# Patient Record
Sex: Female | Born: 1955 | Hispanic: No | Marital: Single | Smoking: Never smoker
Health system: Southern US, Community
[De-identification: ages and names within clinical notes are randomized; demographics above are authoritative.]

## PROBLEM LIST (undated history)

## (undated) DIAGNOSIS — R410 Disorientation, unspecified: Secondary | ICD-10-CM

## (undated) DIAGNOSIS — F05 Delirium due to known physiological condition: Secondary | ICD-10-CM

## (undated) DIAGNOSIS — Z1329 Encounter for screening for other suspected endocrine disorder: Secondary | ICD-10-CM

## (undated) DIAGNOSIS — I6010 Nontraumatic subarachnoid hemorrhage from unspecified middle cerebral artery: Secondary | ICD-10-CM

## (undated) DIAGNOSIS — I6930 Unspecified sequelae of cerebral infarction: Secondary | ICD-10-CM

## (undated) DIAGNOSIS — I677 Cerebral arteritis, not elsewhere classified: Secondary | ICD-10-CM

## (undated) DIAGNOSIS — A0472 Enterocolitis due to Clostridium difficile, not specified as recurrent: Secondary | ICD-10-CM

## (undated) DIAGNOSIS — M13862 Other specified arthritis, left knee: Secondary | ICD-10-CM

## (undated) DIAGNOSIS — F29 Unspecified psychosis not due to a substance or known physiological condition: Secondary | ICD-10-CM

## (undated) DIAGNOSIS — R2681 Unsteadiness on feet: Secondary | ICD-10-CM

## (undated) DIAGNOSIS — I10 Essential (primary) hypertension: Secondary | ICD-10-CM

## (undated) DIAGNOSIS — R569 Unspecified convulsions: Secondary | ICD-10-CM

## (undated) DIAGNOSIS — R488 Other symbolic dysfunctions: Secondary | ICD-10-CM

## (undated) DIAGNOSIS — E785 Hyperlipidemia, unspecified: Secondary | ICD-10-CM

## (undated) DIAGNOSIS — G8114 Spastic hemiplegia affecting left nondominant side: Secondary | ICD-10-CM

## (undated) DIAGNOSIS — D649 Anemia, unspecified: Secondary | ICD-10-CM

## (undated) DIAGNOSIS — F0151 Vascular dementia with behavioral disturbance: Secondary | ICD-10-CM

## (undated) DIAGNOSIS — M170 Bilateral primary osteoarthritis of knee: Secondary | ICD-10-CM

## (undated) DIAGNOSIS — F4320 Adjustment disorder, unspecified: Secondary | ICD-10-CM

## (undated) DIAGNOSIS — R293 Abnormal posture: Secondary | ICD-10-CM

## (undated) DIAGNOSIS — M24572 Contracture, left ankle: Secondary | ICD-10-CM

## (undated) DIAGNOSIS — I69098 Other sequelae following nontraumatic subarachnoid hemorrhage: Secondary | ICD-10-CM

## (undated) DIAGNOSIS — F0150 Vascular dementia without behavioral disturbance: Secondary | ICD-10-CM

## (undated) DIAGNOSIS — R1312 Dysphagia, oropharyngeal phase: Secondary | ICD-10-CM

## (undated) DIAGNOSIS — M6281 Muscle weakness (generalized): Secondary | ICD-10-CM

## (undated) HISTORY — PX: CEREBRAL ANEURYSM REPAIR: SHX164

---

## 2017-09-13 ENCOUNTER — Emergency Department (HOSPITAL_COMMUNITY): Payer: Medicaid Other

## 2017-09-13 ENCOUNTER — Inpatient Hospital Stay (HOSPITAL_COMMUNITY)
Admission: EM | Admit: 2017-09-13 | Discharge: 2017-09-18 | DRG: 690 | Disposition: A | Discharge status: Skilled Nursing Facility | Payer: Medicaid Other | Attending: Internal Medicine | Admitting: Internal Medicine

## 2017-09-13 ENCOUNTER — Encounter (HOSPITAL_COMMUNITY): Payer: Self-pay | Admitting: Emergency Medicine

## 2017-09-13 DIAGNOSIS — E876 Hypokalemia: Secondary | ICD-10-CM | POA: Diagnosis present

## 2017-09-13 DIAGNOSIS — E785 Hyperlipidemia, unspecified: Secondary | ICD-10-CM | POA: Diagnosis present

## 2017-09-13 DIAGNOSIS — B9620 Unspecified Escherichia coli [E. coli] as the cause of diseases classified elsewhere: Secondary | ICD-10-CM | POA: Diagnosis present

## 2017-09-13 DIAGNOSIS — F0150 Vascular dementia without behavioral disturbance: Secondary | ICD-10-CM | POA: Diagnosis present

## 2017-09-13 DIAGNOSIS — Z7982 Long term (current) use of aspirin: Secondary | ICD-10-CM

## 2017-09-13 DIAGNOSIS — N10 Acute tubulo-interstitial nephritis: Secondary | ICD-10-CM | POA: Diagnosis present

## 2017-09-13 DIAGNOSIS — N12 Tubulo-interstitial nephritis, not specified as acute or chronic: Secondary | ICD-10-CM

## 2017-09-13 DIAGNOSIS — Z8249 Family history of ischemic heart disease and other diseases of the circulatory system: Secondary | ICD-10-CM

## 2017-09-13 DIAGNOSIS — I6010 Nontraumatic subarachnoid hemorrhage from unspecified middle cerebral artery: Secondary | ICD-10-CM | POA: Insufficient documentation

## 2017-09-13 DIAGNOSIS — Z8673 Personal history of transient ischemic attack (TIA), and cerebral infarction without residual deficits: Secondary | ICD-10-CM | POA: Diagnosis not present

## 2017-09-13 DIAGNOSIS — G8114 Spastic hemiplegia affecting left nondominant side: Secondary | ICD-10-CM | POA: Diagnosis present

## 2017-09-13 DIAGNOSIS — D649 Anemia, unspecified: Secondary | ICD-10-CM | POA: Diagnosis present

## 2017-09-13 DIAGNOSIS — Z79899 Other long term (current) drug therapy: Secondary | ICD-10-CM | POA: Diagnosis not present

## 2017-09-13 DIAGNOSIS — B9689 Other specified bacterial agents as the cause of diseases classified elsewhere: Secondary | ICD-10-CM | POA: Diagnosis present

## 2017-09-13 DIAGNOSIS — R1011 Right upper quadrant pain: Secondary | ICD-10-CM | POA: Diagnosis present

## 2017-09-13 DIAGNOSIS — I10 Essential (primary) hypertension: Secondary | ICD-10-CM | POA: Diagnosis present

## 2017-09-13 DIAGNOSIS — E782 Mixed hyperlipidemia: Secondary | ICD-10-CM | POA: Diagnosis present

## 2017-09-13 DIAGNOSIS — R4701 Aphasia: Secondary | ICD-10-CM | POA: Diagnosis present

## 2017-09-13 HISTORY — DX: Unspecified sequelae of cerebral infarction: I69.30

## 2017-09-13 HISTORY — DX: Hyperlipidemia, unspecified: E78.5

## 2017-09-13 HISTORY — DX: Nontraumatic subarachnoid hemorrhage from unspecified middle cerebral artery: I60.10

## 2017-09-13 HISTORY — DX: Cerebral arteritis, not elsewhere classified: I67.7

## 2017-09-13 HISTORY — DX: Muscle weakness (generalized): M62.81

## 2017-09-13 HISTORY — DX: Vascular dementia with behavioral disturbance: F01.51

## 2017-09-13 HISTORY — DX: Disorientation, unspecified: R41.0

## 2017-09-13 HISTORY — DX: Other symbolic dysfunctions: R48.8

## 2017-09-13 HISTORY — DX: Anemia, unspecified: D64.9

## 2017-09-13 HISTORY — DX: Spastic hemiplegia affecting left nondominant side: G81.14

## 2017-09-13 HISTORY — DX: Unsteadiness on feet: R26.81

## 2017-09-13 HISTORY — DX: Delirium due to known physiological condition: F05

## 2017-09-13 HISTORY — DX: Abnormal posture: R29.3

## 2017-09-13 HISTORY — DX: Dysphagia, oropharyngeal phase: R13.12

## 2017-09-13 HISTORY — DX: Vascular dementia, unspecified severity, without behavioral disturbance, psychotic disturbance, mood disturbance, and anxiety: F01.50

## 2017-09-13 HISTORY — DX: Encounter for screening for other suspected endocrine disorder: Z13.29

## 2017-09-13 HISTORY — DX: Unspecified psychosis not due to a substance or known physiological condition: F29

## 2017-09-13 HISTORY — DX: Other sequelae following nontraumatic subarachnoid hemorrhage: I69.098

## 2017-09-13 HISTORY — DX: Bilateral primary osteoarthritis of knee: M17.0

## 2017-09-13 HISTORY — DX: Essential (primary) hypertension: I10

## 2017-09-13 HISTORY — DX: Other specified arthritis, left knee: M13.862

## 2017-09-13 HISTORY — DX: Adjustment disorder, unspecified: F43.20

## 2017-09-13 HISTORY — DX: Contracture, left ankle: M24.572

## 2017-09-13 LAB — URINALYSIS, ROUTINE W REFLEX MICROSCOPIC
Bilirubin Urine: NEGATIVE
Glucose, UA: NEGATIVE mg/dL
Ketones, ur: 5 mg/dL — AB
NITRITE: NEGATIVE
PH: 5 (ref 5.0–8.0)
Protein, ur: 30 mg/dL — AB
SPECIFIC GRAVITY, URINE: 1.016 (ref 1.005–1.030)
Squamous Epithelial / LPF: NONE SEEN

## 2017-09-13 LAB — CBC WITH DIFFERENTIAL/PLATELET
BASOS PCT: 0 %
Basophils Absolute: 0 10*3/uL (ref 0.0–0.1)
Eosinophils Absolute: 0 10*3/uL (ref 0.0–0.7)
Eosinophils Relative: 0 %
HEMATOCRIT: 36.7 % (ref 36.0–46.0)
HEMOGLOBIN: 11.6 g/dL — AB (ref 12.0–15.0)
LYMPHS ABS: 1.3 10*3/uL (ref 0.7–4.0)
Lymphocytes Relative: 11 %
MCH: 31.2 pg (ref 26.0–34.0)
MCHC: 31.6 g/dL (ref 30.0–36.0)
MCV: 98.7 fL (ref 78.0–100.0)
MONO ABS: 1.2 10*3/uL (ref 0.1–1.0)
MONOS PCT: 9 %
NEUTROS ABS: 9.9 10*3/uL (ref 1.7–7.7)
Neutrophils Relative %: 80 %
Platelets: 146 10*3/uL — ABNORMAL LOW (ref 150–400)
RBC: 3.72 MIL/uL — ABNORMAL LOW (ref 3.87–5.11)
RDW: 12.7 % (ref 11.5–15.5)
WBC: 12.4 10*3/uL — ABNORMAL HIGH (ref 4.0–10.5)

## 2017-09-13 LAB — LIPASE, BLOOD: LIPASE: 18 U/L (ref 11–51)

## 2017-09-13 LAB — COMPREHENSIVE METABOLIC PANEL
ALT: 16 U/L (ref 14–54)
ANION GAP: 12 (ref 5–15)
AST: 19 U/L (ref 15–41)
Albumin: 2.7 g/dL — ABNORMAL LOW (ref 3.5–5.0)
Alkaline Phosphatase: 55 U/L (ref 38–126)
BILIRUBIN TOTAL: 0.8 mg/dL (ref 0.3–1.2)
BUN: 16 mg/dL (ref 6–20)
CALCIUM: 8.3 mg/dL — AB (ref 8.9–10.3)
CO2: 23 mmol/L (ref 22–32)
CREATININE: 0.59 mg/dL (ref 0.44–1.00)
Chloride: 106 mmol/L (ref 101–111)
Glucose, Bld: 178 mg/dL — ABNORMAL HIGH (ref 65–99)
Potassium: 3.4 mmol/L — ABNORMAL LOW (ref 3.5–5.1)
Sodium: 141 mmol/L (ref 135–145)
TOTAL PROTEIN: 6.4 g/dL — AB (ref 6.5–8.1)

## 2017-09-13 MED ORDER — SODIUM CHLORIDE 0.9 % IV BOLUS (SEPSIS)
1000.0000 mL | Freq: Once | INTRAVENOUS | Status: AC
  Administered 2017-09-13: 1000 mL via INTRAVENOUS

## 2017-09-13 MED ORDER — ONDANSETRON HCL 4 MG/2ML IJ SOLN
4.0000 mg | Freq: Once | INTRAMUSCULAR | Status: AC
  Administered 2017-09-13: 4 mg via INTRAVENOUS
  Filled 2017-09-13: qty 2

## 2017-09-13 MED ORDER — ACETAMINOPHEN 325 MG PO TABS
650.0000 mg | ORAL_TABLET | Freq: Four times a day (QID) | ORAL | Status: DC | PRN
  Administered 2017-09-14: 650 mg via ORAL
  Filled 2017-09-13: qty 2

## 2017-09-13 MED ORDER — ONDANSETRON HCL 4 MG PO TABS: 4.0000 mg | ORAL_TABLET | Freq: Four times a day (QID) | ORAL | Status: DC | PRN

## 2017-09-13 MED ORDER — SODIUM CHLORIDE 0.9 % IV SOLN
1.0000 g | INTRAVENOUS | Status: DC
  Administered 2017-09-14: 1 g via INTRAVENOUS
  Filled 2017-09-13 (×2): qty 10
  Filled 2017-09-13: qty 1

## 2017-09-13 MED ORDER — ONDANSETRON HCL 4 MG/2ML IJ SOLN
4.0000 mg | Freq: Once | INTRAMUSCULAR | Status: AC
  Administered 2017-09-13: 4 mg via INTRAVENOUS

## 2017-09-13 MED ORDER — SODIUM CHLORIDE 0.9 % IV SOLN: INTRAVENOUS | Status: DC

## 2017-09-13 MED ORDER — ACETAMINOPHEN 650 MG RE SUPP: 650.0000 mg | Freq: Four times a day (QID) | RECTAL | Status: DC | PRN

## 2017-09-13 MED ORDER — ONDANSETRON HCL 4 MG/2ML IJ SOLN: 4.0000 mg | Freq: Four times a day (QID) | INTRAMUSCULAR | Status: DC | PRN

## 2017-09-13 MED ORDER — SODIUM CHLORIDE 0.9 % IV SOLN
1.0000 g | Freq: Once | INTRAVENOUS | Status: AC
  Administered 2017-09-13: 1 g via INTRAVENOUS
  Filled 2017-09-13: qty 10

## 2017-09-13 MED ORDER — POTASSIUM CHLORIDE IN NACL 20-0.9 MEQ/L-% IV SOLN
INTRAVENOUS | Status: AC
  Administered 2017-09-14 (×2): via INTRAVENOUS

## 2017-09-13 MED ORDER — IOPAMIDOL (ISOVUE-300) INJECTION 61%
100.0000 mL | Freq: Once | INTRAVENOUS | Status: AC | PRN
  Administered 2017-09-13: 100 mL via INTRAVENOUS

## 2017-09-13 NOTE — H&P (Signed)
4        History and Physical    Lisa [name] ZOX:[id number] DOB: 11/07/1955 DOA: 09/13/2017  PCP: Audree Bane, DO   Patient coming from: [facility] of [place].  I have personally briefly reviewed patient's old medical records in [facility]  Chief Complaint: Fever and vomiting.  HPI: Lisa [name] is a 62 y.o. 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  She denies earache, headache, rhinorrhea, sore throat, productive cough, wheezing, hemoptysis, chest pain, palpitations, dizziness, lower extremity edema, abdominal pain, diarrhea, constipation, melena or hematochezia.  She denies flank pain, dysuria, frequency or hematuria.  Denies heat or cold intolerance.  Denies polydipsia, polyuria or polyphagia.  ED Course: Initial vital signs temperature 37.5 C (99.5 F), pulse 102, respirations 16, blood pressure 139/73 mmHg and O2 sat 96% on room air.  Medications in ED: the patient received 1000 mL of normal saline bolus, Zofran 4 mg IVPB x2 and Rocephin 1 g IVPB x1.  Her urinalysis showed large hemoglobinuria, moderate leukocyte esterase, 30 mg/dL of protein and 5 mg/dL of ketones.  Microscopic showed TNTC RBC and TNTC WBC with rare bacteria per hpf.  CBC shows a white count of 12.4 with 80% neutrophils, 11% lymphocytes and 9% monocytes.  Hemoglobin was 11.6 g/dL and platelets 811.  CMP shows a potassium of 3.4 mmol S/L.  Glucose of 178, calcium 8.3 mg/dL.  Total protein 6.4 and albumin 2.7 g/dL.  All other values are within normal limits.  Imaging: Chest radiograph showed  bandlike opacity at the left base which is probably atelectasis, although pneumonia was not excluded.  CT abdomen/pelvis with contrast show increased enhancement in the periphery of the right renal collecting system with mildly heterogeneous enhancement pattern of the parenchyma, which could be indicating pyelonephritis.  Review of Systems: As per HPI otherwise 10 point review of systems negative.    Past Medical History:  Diagnosis Date  . Abnormal posture   . Adjustment disorder   . Anemia, unspecified   . Aphasia-angular gyrus syndrome   . Arthritis, climacteric, knee, left   . Atypical psychosis (HCC)   . Cerebral arteritis   . Contracture of left ankle   . Essential hypertension, malignant   . Essential hypertension, malignant   . Hyperlipemia   . Left spastic hemiplegia (HCC)   . Muscle weakness (generalized)   . 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 Oropharyngeal dysphagia   . Other sequelae following nontraumatic subarachnoid hemorrhage   . Primary osteoarthritis of both knees   . Screening for thyroid disorder   . Screening for thyroid disorder   . Sequelae of cerebral infarction   . Unsteady   . Vascular dementia with delirium     History reviewed. No pertinent surgical history.   reports that  has never smoked. She does not have any smokeless tobacco history on file. She reports that she does not drink alcohol or use drugs.  No Known Allergies  Family history Hypertension history in several family members.  Prior to Admission medications   Medication Sig Start Date End Date Taking? Authorizing Provider  acetaminophen (TYLENOL) 650 MG CR tablet Take 650 mg by mouth every  8 (eight) hours as needed for pain.   Yes [provider]  aspirin EC 81 MG tablet Take 81 mg by mouth daily.   Yes [provider]  calcium carbonate (TUMS - DOSED IN MG ELEMENTAL CALCIUM) 500 MG chewable tablet Chew 1 tablet by mouth daily.    Yes [provider]  divalproex (DEPAKOTE) 250 MG DR tablet Take 250 mg by mouth 2 (two) times daily.   Yes [provider]  divalproex (DEPAKOTE) 500 MG DR tablet Take 500 mg by mouth 2 (two) times daily.   Yes [provider]  lisinopril (PRINIVIL,ZESTRIL) 10 MG tablet Take 10 mg by mouth daily.   Yes [provider]  tizanidine (ZANAFLEX) 2 MG capsule Take 2 mg by mouth 2 (two) times daily.   Yes [provider]  Vitamin D, Ergocalciferol, (DRISDOL) 50000 units CAPS capsule Take 50,000 Units by mouth every 7 (seven) days.   Yes [provider]    Physical Exam: Vitals:   09/13/17 1829 09/13/17 1930 09/13/17 1930 09/13/17 2030  BP: 139/73 123/72  128/77  Pulse: (!) 102 (!) 101  89  Resp: 16     Temp: 99.5 F (37.5 C)  98.2 F (36.8 C)   TempSrc: Oral  Oral   SpO2: 96% 95%  97%  Weight:      Height:        Constitutional: NAD, calm, comfortable Eyes: PERRL, lids and conjunctivae normal ENMT: Mucous membranes are mildly dry. Posterior pharynx clear of any exudate or lesions. Neck: normal, supple, no masses, no thyromegaly Respiratory: clear to auscultation bilaterally, no wheezing, no crackles. Normal respiratory effort. No accessory muscle use.  Cardiovascular: Regular rate and rhythm, no murmurs / rubs / gallops. No extremity edema. 2+ pedal pulses. No carotid bruits.  Abdomen: Soft, positive RUQ, RLQ and suprapubic tenderness, no guarding/rebound/masses palpated. No hepatosplenomegaly. Bowel sounds positive.  Musculoskeletal: no clubbing / cyanosis.  Good ROM, no contractures.  Abnormal left side extremities muscle tone.  Skin: no rashes, lesions, ulcers on very limited dermatological exam. Neurologic: Left-sided spastic hemiparesis with 2/5 strength. Psychiatric: Normal judgment and insight. Alert and oriented x 4. Normal mood.     Labs on Admission: I have personally reviewed following labs and imaging  studies  CBC: Recent Labs  Lab 09/13/17 1954  WBC 12.4*  NEUTROABS 9.9  HGB 11.6*  HCT 36.7  MCV 98.7  PLT 146*   Basic Metabolic Panel: Recent Labs  Lab 09/13/17 1954  NA 141  K 3.4*  CL 106  CO2 23  GLUCOSE 178*  BUN 16  CREATININE 0.59  CALCIUM 8.3*   GFR: Estimated Creatinine Clearance: 66.5 mL/min (by C-G formula based on SCr of 0.59 mg/dL). Liver Function Tests: Recent Labs  Lab 09/13/17 1954  AST 19  ALT 16  ALKPHOS 55  BILITOT 0.8  PROT 6.4*  ALBUMIN 2.7*   Recent Labs  Lab 09/13/17 1954  LIPASE 18   No results for input(s): AMMONIA in the last 168 hours. Coagulation Profile: No results for input(s): INR, PROTIME in the last 168 hours. Cardiac Enzymes: No results for input(s): CKTOTAL, CKMB, CKMBINDEX, TROPONINI in the last 168 hours. BNP (last 3 results) No results for input(s): PROBNP in the last 8760 hours. HbA1C: No results for input(s): HGBA1C in the last 72 hours. CBG: No results for input(s): GLUCAP in the last 168 hours. Lipid Profile: No results for input(s): CHOL, HDL, LDLCALC, TRIG, CHOLHDL, LDLDIRECT in the last 72 hours. Thyroid  Function Tests: No results for input(s): TSH, T4TOTAL, FREET4, T3FREE, THYROIDAB in the last 72 hours. Anemia Panel: No results for input(s): VITAMINB12, FOLATE, FERRITIN, TIBC, IRON, RETICCTPCT in the last 72 hours. Urine analysis:    Component Value Date/Time   COLORURINE AMBER (A) 09/13/2017 1834   APPEARANCEUR CLOUDY (A) 09/13/2017 1834   LABSPEC 1.016 09/13/2017 1834   PHURINE 5.0 09/13/2017 1834   GLUCOSEU NEGATIVE 09/13/2017 1834   HGBUR LARGE (A) 09/13/2017 1834   BILIRUBINUR NEGATIVE 09/13/2017 1834   KETONESUR 5 (A) 09/13/2017 1834   PROTEINUR 30 (A) 09/13/2017 1834   NITRITE NEGATIVE 09/13/2017 1834   LEUKOCYTESUR MODERATE (A) 09/13/2017 1834    Radiological Exams on Admission: Dg Chest 2 View  Result Date: 09/13/2017 CLINICAL DATA:  Fever and vomiting. EXAM: CHEST - 2 VIEW  COMPARISON:  No comparison studies available. FINDINGS: Bandlike opacity at the left base is likely atelectatic. Right lung clear. Cardiopericardial silhouette is at upper limits of normal for size. The visualized bony structures of the thorax are intact. IMPRESSION: Bandlike opacity at the left base is probably atelectasis although pneumonia not entirely excluded. Electronically Signed   By: Kennith Center M.D.   On: 09/13/2017 19:17   Ct Abdomen Pelvis W Contrast  Result Date: 09/13/2017 CLINICAL DATA:  Right upper quadrant pain EXAM: CT ABDOMEN AND PELVIS WITH CONTRAST TECHNIQUE: Multidetector CT imaging of the abdomen and pelvis was performed using the standard protocol following bolus administration of intravenous contrast. CONTRAST:  ISOVUE-300 IOPAMIDOL (ISOVUE-300) INJECTION 61% COMPARISON:  None. FINDINGS: Lower chest: No basilar pulmonary nodules or pleural effusion. No apical pericardial effusion. Hepatobiliary: Normal hepatic contours and density. No visible biliary dilatation. Normal gallbladder. Pancreas: Normal parenchymal contours without ductal dilatation. No peripancreatic fluid collection. Spleen: Normal. Adrenals/Urinary Tract: --Adrenal glands: Normal. --Right kidney/ureter: There is hyperenhancement of the wall of the right renal pelvis, which is mildly dilated. No ureteral obstruction is identified. Calcification near the upper pole is more likely vascular. Mildly heterogeneous enhancement pattern of the renal parenchyma. --Left kidney/ureter: No hydronephrosis, perinephric stranding or nephrolithiasis. No obstructing ureteral stones. --Urinary bladder: Normal appearance for the degree of distention. Stomach/Bowel: --Stomach/Duodenum: No hiatal hernia or other gastric abnormality. Normal duodenal course. --Small bowel: No dilatation or inflammation. --Colon: There is a medium-size stool ball in the rectum with moderate surrounding wall thickening. The remainder of the colon is normal.  --Appendix: Normal. Vascular/Lymphatic: Atherosclerotic calcification is present within the non-aneurysmal abdominal aorta, without hemodynamically significant stenosis. The portal vein, splenic vein, superior mesenteric vein and IVC are patent. No abdominal or pelvic lymphadenopathy. Reproductive: Normal uterus and ovaries. Musculoskeletal. No bony spinal canal stenosis or focal osseous abnormality. Other: None. IMPRESSION: 1. Increased enhancement of the periphery of the right renal collecting system with mildly heterogeneous enhancement pattern of the renal parenchyma. This could indicate ascending urinary tract infection and/or pyelonephritis. Correlate with urinalysis results. 2. Mild wall thickening of the rectum with medium-size stool ball. This could indicate a mild or early distal colitis. 3.  Aortic Atherosclerosis (ICD10-I70.0). Electronically Signed   By: Deatra Robinson M.D.   On: 09/13/2017 22:12    EKG: Independently reviewed.   Assessment/Plan Principal Problem:   Acute pyelonephritis Admit to MedSurg/inpatient. Continue IV fluids. Monitor intake and output. Continue ceftriaxone 1 g IVPB every 24 hours. Follow-up blood cultures and sensitivity. Follow-up urine culture and sensitivity.  Active Problems:   Hypokalemia Replacing. Follow-up potassium level.    Anemia, unspecified Check anemia panel. Check a stool occult blood. Monitor  hematocrit and hemoglobin.    Hyperlipemia Not on medical therapy. Continue lifestyle modifications.    Left spastic hemiplegia (HCC) Supportive care. Continue muscle relaxants.    Hypertension Continue lisinopril 10 mg p.o. daily. Monitor blood pressure, renal function and electrolytes.    DVT prophylaxis: Lovenox SQ. Code Status: Full code. Family Communication:  Disposition Plan: Admit for IV antibiotic therapy for 48-72 hours. Consults called:  Admission status: Inpatient/MedSurg.   Bobette Mo MD [facility] Pager [phone].  If 7PM-7AM, please contact night-coverage [email or web address] Password [email or web address]  09/13/2017, 11:11 PM

## 2017-09-13 NOTE — ED Triage Notes (Signed)
Pt brought in by ems for fever and vomiting.  Given Tylenol  po pta and tolerated well by pt.  Unsure of onset.

## 2017-09-13 NOTE — ED Provider Notes (Signed)
[facility] EMERGENCY DEPARTMENT Provider Note   CSN: [id number] Arrival date & time: 09/13/17  1758     History   Chief Complaint Chief Complaint  Patient presents with  . Fever  . Emesis    HPI Lisa [name] is a 62 y.o. female.  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  She has been in her usual state of health until the last 3 days when she started to have vomiting and fever - seems to be worse with eating - no associated diarrhea, rash, cough, sob, and she vehemently denies having any pain head to toe.  She has no swelling of the legs, no sore throat, no blurred vision.  Denies difficulty urinating.  No meds given prior to arrival - arrives by EMS transport  Past Medical History:  Diagnosis Date  . Abnormal posture   . Adjustment disorder   . Anemia, unspecified   . Aphasia-angular gyrus syndrome   . Arthritis, climacteric, knee, left   . Atypical psychosis (HCC)   . Cerebral arteritis   . Contracture of left ankle   . Essential hypertension, malignant   . Essential hypertension, malignant   . Hyperlipemia   . Left spastic hemiplegia (HCC)   . Muscle weakness (generalized)   . 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 Oropharyngeal dysphagia   . Other sequelae following nontraumatic subarachnoid hemorrhage   . Primary osteoarthritis of both knees   . Screening for thyroid disorder   . Screening for thyroid disorder   . Sequelae of cerebral infarction   . Unsteady   . Vascular dementia with delirium     There are no active problems to display for this patient.   History reviewed. No pertinent surgical history.  OB History    No data available       Home Medications    Prior to Admission medications   Medication Sig Start Date End Date Taking? Authorizing Provider  acetaminophen (TYLENOL) 650 MG CR  tablet Take 650 mg by mouth every 8 (eight) hours as needed for pain.   Yes [provider]  aspirin EC 81 MG tablet Take 81 mg by mouth daily.   Yes [provider]  calcium carbonate (TUMS - DOSED IN MG ELEMENTAL CALCIUM) 500 MG chewable tablet Chew 1 tablet by mouth daily.   Yes [provider]  divalproex (DEPAKOTE) 250 MG DR tablet Take 250 mg by mouth 2 (two) times daily.   Yes [provider]  divalproex (DEPAKOTE) 500 MG DR tablet Take 500 mg by mouth 2 (two) times daily.   Yes [provider]  lisinopril (PRINIVIL,ZESTRIL) 10 MG tablet Take 10 mg by mouth daily.   Yes [provider]  tizanidine (ZANAFLEX) 2 MG capsule Take 2 mg by mouth 2 (two) times daily.   Yes [provider]  Vitamin D, Ergocalciferol, (DRISDOL) 50000 units CAPS capsule Take 50,000 Units by mouth every 7 (seven) days.   Yes [provider]    Family History History reviewed. No pertinent family history.  Social History Social History   Tobacco Use  . Smoking status: Never Smoker  Substance Use Topics  . Alcohol use: No    Frequency: Never  . Drug use: No     Allergies   Patient has no known allergies.   Review of Systems  Review of Systems  All other systems reviewed and are negative.    Physical Exam Updated Vital Signs BP 128/77   Pulse 89   Temp 98.2 F (36.8 C) (Oral)   Resp 16   Ht 5\' 5"  (1.651 m)   Wt 68 kg (150 lb)   SpO2 97%   BMI 24.96 kg/m   Physical Exam  Constitutional: She appears well-developed and well-nourished. No distress.  HENT:  Head: Normocephalic and atraumatic.  Mouth/Throat: Oropharynx is clear and moist. No oropharyngeal exudate.  Eyes: Conjunctivae and EOM are normal. Pupils are equal, round, and reactive to light. Right eye exhibits no discharge. Left eye exhibits no discharge. No scleral icterus.  Neck: Normal range of motion. Neck supple. No JVD present. No thyromegaly present.    Cardiovascular: Normal rate, regular rhythm, normal heart sounds and intact distal pulses. Exam reveals no gallop and no friction rub.  No murmur heard. Pulmonary/Chest: Effort normal and breath sounds normal. No respiratory distress. She has no wheezes. She has no rales.  Abdominal: Soft. Bowel sounds are normal. She exhibits no distension and no mass. There is tenderness ( RUQ ttp with mild guarding, no other abd ttp).  Musculoskeletal: Normal range of motion. She exhibits no edema or tenderness.  Lymphadenopathy:    She has no cervical adenopathy.  Neurological: She is alert. Coordination normal.  L sided spastic with some weakness.  Answers all of my questions appropriately - able to follow most commands physically.  Noted L sided weakness is asymetrical  Skin: Skin is warm and dry. No rash noted. No erythema.  Psychiatric: She has a normal mood and affect. Her behavior is normal.  Nursing note and vitals reviewed.    ED Treatments / Results  Labs (all labs ordered are listed, but only abnormal results are displayed) Labs Reviewed  CBC WITH DIFFERENTIAL/PLATELET - Abnormal; Notable for the following components:      Result Value   WBC 12.4 (*)    RBC 3.72 (*)    Hemoglobin 11.6 (*)    Platelets 146 (*)    All other components within normal limits  COMPREHENSIVE METABOLIC PANEL - Abnormal; Notable for the following components:   Potassium 3.4 (*)    Glucose, Bld 178 (*)    Calcium 8.3 (*)    Total Protein 6.4 (*)    Albumin 2.7 (*)    All other components within normal limits  URINALYSIS, ROUTINE W REFLEX MICROSCOPIC - Abnormal; Notable for the following components:   Color, Urine AMBER (*)    APPearance CLOUDY (*)    Hgb urine dipstick LARGE (*)    Ketones, ur 5 (*)    Protein, ur 30 (*)    Leukocytes, UA MODERATE (*)    Bacteria, UA RARE (*)    All other components within normal limits  LIPASE, BLOOD    EKG  EKG Interpretation None       Radiology Dg Chest  2 View  Result Date: 09/13/2017 CLINICAL DATA:  Fever and vomiting. EXAM: CHEST - 2 VIEW COMPARISON:  No comparison studies available. FINDINGS: Bandlike opacity at the left base is likely atelectatic. Right lung clear. Cardiopericardial silhouette is at upper limits of normal for size. The visualized bony structures of the thorax are intact. IMPRESSION: Bandlike opacity at the left base is probably atelectasis although pneumonia not entirely excluded. Electronically Signed   By: Kennith CenterEric  Mansell M.D.   On: 09/13/2017 19:17   Ct Abdomen Pelvis W Contrast  Result Date:  09/13/2017 CLINICAL DATA:  Right upper quadrant pain EXAM: CT ABDOMEN AND PELVIS WITH CONTRAST TECHNIQUE: Multidetector CT imaging of the abdomen and pelvis was performed using the standard protocol following bolus administration of intravenous contrast. CONTRAST:  ISOVUE-300 IOPAMIDOL (ISOVUE-300) INJECTION 61% COMPARISON:  None. FINDINGS: Lower chest: No basilar pulmonary nodules or pleural effusion. No apical pericardial effusion. Hepatobiliary: Normal hepatic contours and density. No visible biliary dilatation. Normal gallbladder. Pancreas: Normal parenchymal contours without ductal dilatation. No peripancreatic fluid collection. Spleen: Normal. Adrenals/Urinary Tract: --Adrenal glands: Normal. --Right kidney/ureter: There is hyperenhancement of the wall of the right renal pelvis, which is mildly dilated. No ureteral obstruction is identified. Calcification near the upper pole is more likely vascular. Mildly heterogeneous enhancement pattern of the renal parenchyma. --Left kidney/ureter: No hydronephrosis, perinephric stranding or nephrolithiasis. No obstructing ureteral stones. --Urinary bladder: Normal appearance for the degree of distention. Stomach/Bowel: --Stomach/Duodenum: No hiatal hernia or other gastric abnormality. Normal duodenal course. --Small bowel: No dilatation or inflammation. --Colon: There is a medium-size stool ball in  the rectum with moderate surrounding wall thickening. The remainder of the colon is normal. --Appendix: Normal. Vascular/Lymphatic: Atherosclerotic calcification is present within the non-aneurysmal abdominal aorta, without hemodynamically significant stenosis. The portal vein, splenic vein, superior mesenteric vein and IVC are patent. No abdominal or pelvic lymphadenopathy. Reproductive: Normal uterus and ovaries. Musculoskeletal. No bony spinal canal stenosis or focal osseous abnormality. Other: None. IMPRESSION: 1. Increased enhancement of the periphery of the right renal collecting system with mildly heterogeneous enhancement pattern of the renal parenchyma. This could indicate ascending urinary tract infection and/or pyelonephritis. Correlate with urinalysis results. 2. Mild wall thickening of the rectum with medium-size stool ball. This could indicate a mild or early distal colitis. 3.  Aortic Atherosclerosis (ICD10-I70.0). Electronically Signed   By: Deatra Robinson M.D.   On: 09/13/2017 22:12    Procedures Procedures (including critical care time)  Medications Ordered in ED Medications  cefTRIAXone (ROCEPHIN) 1 g in sodium chloride 0.9 % 100 mL IVPB (not administered)  ondansetron (ZOFRAN) injection 4 mg (not administered)  sodium chloride 0.9 % bolus 1,000 mL (1,000 mLs Intravenous New Bag/Given 09/13/17 2016)  ondansetron (ZOFRAN) injection 4 mg (4 mg Intravenous Given 09/13/17 2207)  iopamidol (ISOVUE-300) 61 % injection 100 mL (100 mLs Intravenous Contrast Given 09/13/17 2129)     Initial Impression / Assessment and Plan / ED Course  I have reviewed the triage vital signs and the nursing notes.  Pertinent labs & imaging results that were available during my care of the patient were reviewed by me and considered in my medical decision making (see chart for details).  Clinical Course as of Sep 13 2225  Thu Sep 13, 2017  2223 Labs with leukocytosis,  [BM]  2223 WBC: (!) 12.4 [BM]  2223  Hgb urine dipstick: (!) LARGE [BM]  2224 Leukocytes, UA: (!) MODERATE [BM]  2224 RBC / HPF: TOO NUMEROUS TO COUNT [BM]  2224 WBC, UA: TOO NUMEROUS TO COUNT [BM]  2224 Bacteria, UA: (!) RARE [BM]  2224 Ketones, ur: (!) 5 [BM]  2224 Proteinuria as well as ketonuria consistent with mild dehydration and the patient's persistent vomiting, too numerous to count white blood cells and red blood cells likely represents urinary tract infection, CT scan ordered Protein: (!) 30 [BM]  2224 The patient has right upper quadrant tenderness with an abnormal urine and this raise suspicion for pyelonephritis which in fact she does have on CT scan.  [BM]  2224   Started  with IV Rocephin.  Will discuss with hospitalist for admission as she is still having nausea and feels poorly, has ongoing tenderness in the right flank and abdomen  [BM]    Clinical Course User Index [BM] Eber Hong, MD    The patient has reported fever, vomiting, right upper quadrant tenderness which is suggestive of a biliary process, will check labs, fluids, antiemetics, imaging as per results of the labs, patient may need CT scan imaging.  At this hour we do not have ultrasound  Final Clinical Impressions(s) / ED Diagnoses   Final diagnoses:  Pyelonephritis      Eber Hong, MD 09/13/17 2227

## 2017-09-13 NOTE — ED Notes (Signed)
Patient refuse new IV insertion.

## 2017-09-14 ENCOUNTER — Other Ambulatory Visit: Payer: Self-pay

## 2017-09-14 ENCOUNTER — Encounter (HOSPITAL_COMMUNITY): Payer: Self-pay | Admitting: Internal Medicine

## 2017-09-14 DIAGNOSIS — N10 Acute tubulo-interstitial nephritis: Principal | ICD-10-CM

## 2017-09-14 DIAGNOSIS — G8114 Spastic hemiplegia affecting left nondominant side: Secondary | ICD-10-CM | POA: Diagnosis present

## 2017-09-14 LAB — CBC WITH DIFFERENTIAL/PLATELET
BASOS ABS: 0 10*3/uL (ref 0.0–0.1)
Basophils Relative: 0 %
EOS ABS: 0 10*3/uL (ref 0.0–0.7)
EOS PCT: 0 %
HCT: 32.4 % — ABNORMAL LOW (ref 36.0–46.0)
HEMOGLOBIN: 10 g/dL — AB (ref 12.0–15.0)
Lymphocytes Relative: 6 %
Lymphs Abs: 0.7 10*3/uL (ref 0.7–4.0)
MCH: 30.7 pg (ref 26.0–34.0)
MCHC: 30.9 g/dL (ref 30.0–36.0)
MCV: 99.4 fL (ref 78.0–100.0)
Monocytes Absolute: 2 10*3/uL — ABNORMAL HIGH (ref 0.1–1.0)
Monocytes Relative: 16 %
NEUTROS PCT: 78 %
Neutro Abs: 9.2 10*3/uL — ABNORMAL HIGH (ref 1.7–7.7)
PLATELETS: 151 10*3/uL (ref 150–400)
RBC: 3.26 MIL/uL — AB (ref 3.87–5.11)
RDW: 12.9 % (ref 11.5–15.5)
WBC: 11.9 10*3/uL — AB (ref 4.0–10.5)

## 2017-09-14 LAB — FOLATE: FOLATE: 14.3 ng/mL (ref >5.9)

## 2017-09-14 LAB — BASIC METABOLIC PANEL
ANION GAP: 10 (ref 5–15)
BUN: 11 mg/dL (ref 6–20)
CHLORIDE: 106 mmol/L (ref 101–111)
CO2: 22 mmol/L (ref 22–32)
Calcium: 7.5 mg/dL — ABNORMAL LOW (ref 8.9–10.3)
Creatinine, Ser: 0.59 mg/dL (ref 0.44–1.00)
GFR calc Af Amer: >60 mL/min (ref >60)
Glucose, Bld: 161 mg/dL — ABNORMAL HIGH (ref 65–99)
POTASSIUM: 3.3 mmol/L — AB (ref 3.5–5.1)
SODIUM: 138 mmol/L (ref 135–145)

## 2017-09-14 LAB — MRSA PCR SCREENING: MRSA by PCR: NEGATIVE

## 2017-09-14 LAB — RETICULOCYTES
RBC.: 3.26 MIL/uL — ABNORMAL LOW (ref 3.87–5.11)
RETIC COUNT ABSOLUTE: 42.4 10*3/uL (ref 19.0–186.0)
Retic Ct Pct: 1.3 % (ref 0.4–3.1)

## 2017-09-14 LAB — IRON AND TIBC
IRON: 9 ug/dL — AB (ref 28–170)
Saturation Ratios: 4 % — ABNORMAL LOW (ref 10.4–31.8)
TIBC: 207 ug/dL — ABNORMAL LOW (ref 250–450)
UIBC: 198 ug/dL

## 2017-09-14 LAB — VITAMIN B12: VITAMIN B 12: 653 pg/mL (ref 180–914)

## 2017-09-14 LAB — FERRITIN: FERRITIN: 212 ng/mL (ref 11–307)

## 2017-09-14 MED ORDER — DIVALPROEX SODIUM 250 MG PO DR TAB
250.0000 mg | DELAYED_RELEASE_TABLET | Freq: Two times a day (BID) | ORAL | Status: DC
  Administered 2017-09-14 – 2017-09-17 (×8): 250 mg via ORAL
  Filled 2017-09-14 (×8): qty 1

## 2017-09-14 MED ORDER — TIZANIDINE HCL 4 MG PO TABS
ORAL_TABLET | ORAL | Status: AC
  Filled 2017-09-14: qty 1

## 2017-09-14 MED ORDER — CALCIUM CARBONATE ANTACID 500 MG PO CHEW
1.0000 | CHEWABLE_TABLET | Freq: Every day | ORAL | Status: DC
  Administered 2017-09-14 – 2017-09-18 (×5): 200 mg via ORAL
  Filled 2017-09-14 (×5): qty 1

## 2017-09-14 MED ORDER — LISINOPRIL 10 MG PO TABS
10.0000 mg | ORAL_TABLET | Freq: Every day | ORAL | Status: DC
  Administered 2017-09-14: 10 mg via ORAL
  Filled 2017-09-14: qty 1

## 2017-09-14 MED ORDER — ASPIRIN EC 81 MG PO TBEC
81.0000 mg | DELAYED_RELEASE_TABLET | Freq: Every day | ORAL | Status: DC
  Administered 2017-09-14 – 2017-09-18 (×5): 81 mg via ORAL
  Filled 2017-09-14 (×5): qty 1

## 2017-09-14 MED ORDER — DIVALPROEX SODIUM 250 MG PO DR TAB
500.0000 mg | DELAYED_RELEASE_TABLET | Freq: Two times a day (BID) | ORAL | Status: DC
  Administered 2017-09-14 – 2017-09-17 (×8): 500 mg via ORAL
  Filled 2017-09-14 (×8): qty 2

## 2017-09-14 MED ORDER — TIZANIDINE HCL 2 MG PO TABS
2.0000 mg | ORAL_TABLET | Freq: Two times a day (BID) | ORAL | Status: DC
  Administered 2017-09-14 – 2017-09-18 (×10): 2 mg via ORAL
  Filled 2017-09-14 (×12): qty 1

## 2017-09-14 MED ORDER — VITAMIN D (ERGOCALCIFEROL) 1.25 MG (50000 UNIT) PO CAPS
50000.0000 [IU] | ORAL_CAPSULE | ORAL | Status: DC
  Filled 2017-09-14: qty 1

## 2017-09-14 NOTE — Clinical Social Work Note (Signed)
Clinical Social Work Assessment  Patient Details  Name: Lisa [name] MRN: [id number] Date of Birth: 1955/09/11  Date of referral:  09/14/17               Reason for consult:  Discharge Planning                Permission sought to share information with:    Permission granted to share information::     Name::        Agency::     Relationship::     Contact Information:     Housing/Transportation Living arrangements for the past 2 months:  Skilled Nursing Facility Source of Information:  Facility Patient Interpreter Needed:  None Criminal Activity/Legal Involvement Pertinent to Current Situation/Hospitalization:  No - Comment as needed Significant Relationships:  Siblings Lives with:  Facility Resident Do you feel safe going back to the place where you live?  Yes Need for family participation in patient care:  Yes (Comment)  Care giving concerns: Pt [facility].   Social Worker assessment / plan: Pt is a 62 year old female admitted from [facility]. Plan is for return there at dc. Spoke with Lisa [name] at the SNF today to update on pt's status. Per Lisa [name], pt is mostly bed ridden at the SNF but she does sit up in her w/c occasionally. Pt needs assistance with all ADL's. She can feed herself once the meal set up is complete. Pt has two sisters who live out of town but visit about twice a month. Pt has been at the SNF for a little over a year. She first was placed there after a stroke. She does have residual symptoms from the stroke.  Employment status:  Disabled (Comment on whether or not currently receiving Disability) Insurance information:  [place] PT Recommendations:  Not assessed at this time Information / Referral to community resources:     Patient/Family's Response to care: Pt accepting of care.  Patient/Family's Understanding of and Emotional Response to Diagnosis, Current Treatment, and Prognosis: Pt appears to have a good  understanding of diagnosis and treatment recommendations. No emotional distress identified.  Emotional Assessment Appearance:  Appears stated age Attitude/Demeanor/Rapport:  Engaged Affect (typically observed):  Accepting Orientation:  Oriented to Self, Oriented to Place, Oriented to Situation Alcohol / Substance use:  Not Applicable Psych involvement (Current and /or in the community):  No (Comment)  Discharge Needs  Concerns to be addressed:  Discharge Planning Concerns Readmission within the last 30 days:  No Current discharge risk:  None Barriers to Discharge:  No Barriers Identified   Lisa GaultKathleen Senetra Dillin, LCSW 09/14/2017, 11:09 AM

## 2017-09-14 NOTE — Progress Notes (Addendum)
PROGRESS NOTE    Lisa [name]  ZOX:[id number] DOB: 11/04/1955 DOA: 09/13/2017 PCP: Audree Bane, DO   Brief Narrative:   Lisa [name] is a 62 y.o. 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  CT scan appears suspicious for right-sided acute pyelonephritis and patient has been started on treatment with IV Rocephin with urine and blood cultures obtained.  These are currently pending.  She was also noted to be hypokalemic for which she is currently undergoing potassium supplementation.  Assessment & Plan:   Principal Problem:   Acute pyelonephritis Active Problems:   Hypokalemia   Anemia, unspecified   Hyperlipemia   Hypertension   Left spastic hemiplegia (HCC)   1. Acute right-sided pyelonephritis.  Continue empiric IV Rocephin.  Unfortunately it does not appear that blood cultures have been obtained and neither have urine cultures been obtained.  We will at least obtain urine culture at this time.  Patient can be transferred to MedSurg floor and does not require stepdown unit placement at this time. 2. Hypokalemia.  Continue to replace with IV fluid ongoing.  Repeat morning labs with magnesium levels. 3. Anemia-unspecified.  Further workup currently pending. 4. Dyslipidemia.  Not currently on medical therapy.  Continue lifestyle modifications. 5. Left spastic hemiplegia.  This is chronic.  Continue supportive care as well as muscle relaxants. 6. Hypertension.  Continue lisinopril 10 mg p.o. daily, this is otherwise well controlled.   DVT prophylaxis:Lovenox Code  Status: Full Family Communication: None Disposition Plan: IV antibiotic therapy for 48-72 hours and await further culture results.   Consultants:   None  Procedures:   None  Antimicrobials:   None   Subjective: Patient seen and evaluated today with no new acute complaints or concerns.  She denies any pain, but does complain of some chills this morning and has been given an extra blanket with some relief.  Vital signs have remained stable.  Objective: Vitals:   09/14/17 0500 09/14/17 0839 09/14/17 0943 09/14/17 1257  BP:   101/69   Pulse:      Resp:      Temp:  98.7 F (37.1 C)  99 F (37.2 C)  TempSrc:  Oral  Oral  SpO2:      Weight: 54.8 kg (120 lb 13 oz)     Height:        Intake/Output Summary (Last 24 hours) at 09/14/2017 1309 Last data filed at 09/14/2017 1259 Gross per 24 hour  Intake 670.83 ml  Output 1 ml  Net 669.83 ml   Filed Weights   09/13/17 1801 09/14/17 0128 09/14/17 0500  Weight: 68 kg (150 lb) 54.8 kg (120 lb 13 oz) 54.8 kg (120 lb 13 oz)    Examination:  General exam: Appears calm and comfortable  Respiratory system: Clear to auscultation. Respiratory effort normal. Cardiovascular system: S1 & S2 heard, RRR. No JVD, murmurs, rubs, gallops or clicks. No pedal edema. Gastrointestinal system: Abdomen is nondistended, soft and nontender. No organomegaly or masses felt. Normal bowel sounds heard. Central nervous system: Alert and oriented. No focal neurological deficits. Extremities: Symmetric 5 x 5 power. Skin: No rashes, lesions or ulcers Psychiatry: Judgement and insight appear normal. Mood & affect appropriate.  Data Reviewed: I have personally reviewed following labs and imaging studies  CBC: Recent Labs  Lab 09/13/17 1954 09/14/17 0427  WBC 12.4* 11.9*  NEUTROABS 9.9 9.2*  HGB 11.6* 10.0*  HCT 36.7 32.4*  MCV 98.7 99.4  PLT 146* 151   Basic Metabolic Panel: Recent Labs  Lab 09/13/17 1954 09/14/17 0427  NA 141 138  K  3.4* 3.3*  CL 106 106  CO2 23 22  GLUCOSE 178* 161*  BUN 16 11  CREATININE 0.59 0.59  CALCIUM 8.3* 7.5*   GFR: Estimated Creatinine Clearance: 63.8 mL/min (by C-G formula based on SCr of 0.59 mg/dL). Liver Function Tests: Recent Labs  Lab 09/13/17 1954  AST 19  ALT 16  ALKPHOS 55  BILITOT 0.8  PROT 6.4*  ALBUMIN 2.7*   Recent Labs  Lab 09/13/17 1954  LIPASE 18   No results for input(s): AMMONIA in the last 168 hours. Coagulation Profile: No results for input(s): INR, PROTIME in the last 168 hours. Cardiac Enzymes: No results for input(s): CKTOTAL, CKMB, CKMBINDEX, TROPONINI in the last 168 hours. BNP (last 3 results) No results for input(s): PROBNP in the last 8760 hours. HbA1C: No results for input(s): HGBA1C in the last 72 hours. CBG: No results for input(s): GLUCAP in the last 168 hours. Lipid Profile: No results for input(s): CHOL, HDL, LDLCALC, TRIG, CHOLHDL, LDLDIRECT in the last 72 hours. Thyroid Function Tests: No results for input(s): TSH, T4TOTAL, FREET4, T3FREE, THYROIDAB in the last 72 hours. Anemia Panel: Recent Labs    09/14/17 0427  RETICCTPCT 1.3   Sepsis Labs: No results for input(s): PROCALCITON, LATICACIDVEN in the last 168 hours.  Recent Results (from the past 240 hour(s))  MRSA PCR Screening     Status: None   Collection Time: 09/14/17 12:45 AM  Result Value Ref Range Status   MRSA by PCR NEGATIVE NEGATIVE Final    Comment:        The GeneXpert MRSA Assay (FDA approved for NASAL specimens only), is one component of a comprehensive MRSA colonization surveillance program. It is not intended to diagnose MRSA infection nor to guide or monitor treatment for MRSA infections. Performed at [facility], [address] [place], [place] [id number]          Radiology Studies: Dg Chest 2 View  Result Date: 09/13/2017 CLINICAL DATA:  Fever and vomiting. EXAM: CHEST - 2 VIEW COMPARISON:  No comparison studies available. FINDINGS:  Bandlike opacity at the left base is likely atelectatic. Right lung clear. Cardiopericardial silhouette is at upper limits of normal for size. The visualized bony structures of the thorax are intact. IMPRESSION: Bandlike opacity at the left base is probably atelectasis although pneumonia not entirely excluded. Electronically Signed   By: Kennith CenterEric  Mansell M.D.   On: 09/13/2017 19:17   Ct Abdomen Pelvis W Contrast  Result Date: 09/13/2017 CLINICAL DATA:  Right upper quadrant pain EXAM: CT ABDOMEN AND PELVIS WITH CONTRAST TECHNIQUE: Multidetector CT imaging of the abdomen and pelvis was performed using the standard protocol following bolus administration of intravenous contrast. CONTRAST:  100mL ISOVUE-300 IOPAMIDOL (ISOVUE-300) INJECTION 61% COMPARISON:  None. FINDINGS: Lower chest: No basilar pulmonary nodules or pleural effusion. No apical pericardial effusion. Hepatobiliary: Normal hepatic contours and density. No visible biliary dilatation. Normal gallbladder. Pancreas: Normal parenchymal contours without ductal dilatation. No peripancreatic fluid collection. Spleen: Normal. Adrenals/Urinary Tract: --Adrenal glands: Normal. --Right kidney/ureter: There is hyperenhancement of the wall of the right renal pelvis, which is mildly dilated. No ureteral obstruction  is identified. Calcification near the upper pole is more likely vascular. Mildly heterogeneous enhancement pattern of the renal parenchyma. --Left kidney/ureter: No hydronephrosis, perinephric stranding or nephrolithiasis. No obstructing ureteral stones. --Urinary bladder: Normal appearance for the degree of distention. Stomach/Bowel: --Stomach/Duodenum: No hiatal hernia or other gastric abnormality. Normal duodenal course. --Small bowel: No dilatation or inflammation. --Colon: There is a medium-size stool ball in the rectum with moderate surrounding wall thickening. The remainder of the colon is normal. --Appendix: Normal. Vascular/Lymphatic: Atherosclerotic  calcification is present within the non-aneurysmal abdominal aorta, without hemodynamically significant stenosis. The portal vein, splenic vein, superior mesenteric vein and IVC are patent. No abdominal or pelvic lymphadenopathy. Reproductive: Normal uterus and ovaries. Musculoskeletal. No bony spinal canal stenosis or focal osseous abnormality. Other: None. IMPRESSION: 1. Increased enhancement of the periphery of the right renal collecting system with mildly heterogeneous enhancement pattern of the renal parenchyma. This could indicate ascending urinary tract infection and/or pyelonephritis. Correlate with urinalysis results. 2. Mild wall thickening of the rectum with medium-size stool ball. This could indicate a mild or early distal colitis. 3.  Aortic Atherosclerosis (ICD10-I70.0). Electronically Signed   By: Deatra Robinson M.D.   On: 09/13/2017 22:12        Scheduled Meds: . aspirin EC  81 mg Oral Daily  . calcium carbonate  1 tablet Oral Daily  . divalproex  250 mg Oral BID  . divalproex  500 mg Oral BID  . lisinopril  10 mg Oral Daily  . tiZANidine  2 mg Oral BID  . [START ON 09/19/2017] Vitamin D (Ergocalciferol)  50,000 Units Oral Q7 days   Continuous Infusions: . 0.9 % NaCl with KCl 20 mEq / L 125 mL/hr at 09/14/17 0906  . cefTRIAXone (ROCEPHIN)  IV       LOS: 1 day    Time spent: 30 minutes    Amenda Duclos Hoover Brunette, DO [facility] Pager [phone]  If 7PM-7AM, please contact night-coverage [email or web address] Password [email or web address] 09/14/2017, 1:09 PM

## 2017-09-15 LAB — BASIC METABOLIC PANEL
Anion gap: 12 (ref 5–15)
BUN: 7 mg/dL (ref 6–20)
CHLORIDE: 104 mmol/L (ref 101–111)
CO2: 21 mmol/L — ABNORMAL LOW (ref 22–32)
Calcium: 8.5 mg/dL — ABNORMAL LOW (ref 8.9–10.3)
Creatinine, Ser: 0.49 mg/dL (ref 0.44–1.00)
GFR calc Af Amer: >60 mL/min (ref >60)
GFR calc non Af Amer: >60 mL/min (ref >60)
GLUCOSE: 81 mg/dL (ref 65–99)
POTASSIUM: 3.7 mmol/L (ref 3.5–5.1)
Sodium: 137 mmol/L (ref 135–145)

## 2017-09-15 LAB — CBC WITH DIFFERENTIAL/PLATELET
BASOS PCT: 0 %
Basophils Absolute: 0 10*3/uL (ref 0.0–0.1)
Eosinophils Absolute: 0 10*3/uL (ref 0.0–0.7)
Eosinophils Relative: 0 %
HEMATOCRIT: 38.3 % (ref 36.0–46.0)
HEMOGLOBIN: 12 g/dL (ref 12.0–15.0)
Lymphocytes Relative: 11 %
Lymphs Abs: 1.3 10*3/uL (ref 0.7–4.0)
MCH: 31 pg (ref 26.0–34.0)
MCHC: 31.3 g/dL (ref 30.0–36.0)
MCV: 99 fL (ref 78.0–100.0)
Monocytes Absolute: 1.7 10*3/uL — ABNORMAL HIGH (ref 0.1–1.0)
Monocytes Relative: 14 %
NEUTROS PCT: 75 %
Neutro Abs: 8.8 10*3/uL — ABNORMAL HIGH (ref 1.7–7.7)
Platelets: 145 10*3/uL — ABNORMAL LOW (ref 150–400)
RBC: 3.87 MIL/uL (ref 3.87–5.11)
RDW: 12.3 % (ref 11.5–15.5)
WBC: 11.8 10*3/uL — ABNORMAL HIGH (ref 4.0–10.5)

## 2017-09-15 LAB — MAGNESIUM: MAGNESIUM: 1.7 mg/dL (ref 1.7–2.4)

## 2017-09-15 MED ORDER — LACTATED RINGERS IV SOLN
INTRAVENOUS | Status: AC
  Administered 2017-09-15: 09:00:00 via INTRAVENOUS

## 2017-09-15 MED ORDER — VANCOMYCIN HCL 500 MG IV SOLR
500.0000 mg | Freq: Two times a day (BID) | INTRAVENOUS | Status: DC
  Administered 2017-09-15: 500 mg via INTRAVENOUS
  Filled 2017-09-15 (×4): qty 500

## 2017-09-15 MED ORDER — VANCOMYCIN HCL IN DEXTROSE 1-5 GM/200ML-% IV SOLN
1000.0000 mg | Freq: Once | INTRAVENOUS | Status: AC
  Administered 2017-09-15: 1000 mg via INTRAVENOUS
  Filled 2017-09-15: qty 200

## 2017-09-15 MED ORDER — SODIUM CHLORIDE 0.9 % IV SOLN
1.0000 g | Freq: Three times a day (TID) | INTRAVENOUS | Status: DC
  Filled 2017-09-15 (×8): qty 1

## 2017-09-15 MED ORDER — PIPERACILLIN-TAZOBACTAM 3.375 G IVPB
3.3750 g | Freq: Three times a day (TID) | INTRAVENOUS | Status: DC
  Administered 2017-09-15 – 2017-09-18 (×10): 3.375 g via INTRAVENOUS
  Filled 2017-09-15 (×8): qty 50

## 2017-09-15 NOTE — Progress Notes (Addendum)
PROGRESS NOTE    Lisa [name]  ZOX:[id number] DOB: Jan 12, 1956 DOA: 09/13/2017 PCP: Audree BaneKing, Peter, DO   Brief Narrative:   Lisa Shirelizabeth Becker is a 62 y.o. 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  CT scan appears suspicious for right-sided acute pyelonephritis and patient has been started on treatment with IV Rocephin, unfortunately urine and blood cultures have not been obtained.  She continues to have fever and chills this morning.  Assessment & Plan:   Principal Problem:   Acute pyelonephritis Active Problems:   Hypokalemia   Anemia, unspecified   Hyperlipemia   Hypertension   Left spastic hemiplegia (HCC)   1. Acute right-sided pyelonephritis.  She continues to have fever and chills this morning for which I will change antibiotics to broader spectrum with Zosyn and vancomycin.  I will check blood cultures as well as urine culture prior to administration of this antibiotic regimen.  Will consider repeat CT of the abdomen if she fails to improve clinically. 2. Anemia-unspecified.  Further workup currently pending. 3. Dyslipidemia.  Not currently on medical therapy.  Continue lifestyle modifications. 4. Left spastic hemiplegia.  This is chronic.  Continue supportive care as well as muscle relaxants. 5. Hypertension.  Continue lisinopril 10 mg p.o. daily, this is otherwise well controlled.   DVT prophylaxis:Lovenox Code Status: Full Family Communication: None Disposition Plan: IV antibiotic therapy for 48-72 hours and await further culture results.   Consultants:    None  Procedures:   None  Antimicrobials:   None   Subjective: Patient seen and evaluated today with noted ongoing chills and temperature of 100.8 Fahrenheit oral.  She does not appear to be responding well to Rocephin.  No acute overnight events noted.  Objective: Vitals:   09/15/17 0008 09/15/17 0445 09/15/17 0547 09/15/17 0734  BP:      Pulse:      Resp:      Temp: 98.1 F (36.7 C) 99.5 F (37.5 C)  (!) 100.8 F (38.2 C)  TempSrc: Oral Oral  Oral  SpO2:      Weight:   56 kg (123 lb 7.3 oz)   Height:        Intake/Output Summary (Last 24 hours) at 09/15/2017 0809 Last data filed at 09/15/2017 0536 Gross per 24 hour  Intake 1180 ml  Output 900 ml  Net 280 ml   Filed Weights   09/14/17 0128 09/14/17 0500 09/15/17 0547  Weight: 54.8 kg (120 lb 13 oz) 54.8 kg (120 lb 13 oz) 56 kg (123 lb 7.3 oz)    Examination:  General exam: Appears calm and comfortable  Respiratory system: Clear to auscultation. Respiratory effort normal. Cardiovascular system: S1 & S2 heard, RRR. No JVD, murmurs, rubs, gallops or clicks. No pedal edema. Gastrointestinal system: Abdomen is nondistended, soft and nontender. No organomegaly or masses felt. Normal bowel sounds heard. Central nervous system: Alert and oriented. No focal neurological deficits. Extremities: Symmetric 5 x 5 power. Skin: No rashes, lesions or ulcers Psychiatry: Judgement and insight appear normal. Mood & affect appropriate.     Data Reviewed: I have personally reviewed following labs and imaging studies  CBC: Recent Labs  Lab 09/13/17 1954 09/14/17 0427 09/15/17 0435  WBC 12.4* 11.9* 11.8*  NEUTROABS 9.9 9.2* 8.8*  HGB 11.6* 10.0* 12.0  HCT 36.7 32.4* 38.3  MCV 98.7 99.4 99.0  PLT 146* 151 145*   Basic Metabolic Panel: Recent Labs  Lab 09/13/17 1954 09/14/17 0427 09/15/17 0435  NA 141 138 137  K 3.4* 3.3* 3.7  CL 106 106 104  CO2 23 22 21*  GLUCOSE 178* 161* 81  BUN 16 11 7   CREATININE 0.59  0.59 0.49  CALCIUM 8.3* 7.5* 8.5*  MG  --   --  1.7   GFR: Estimated Creatinine Clearance: 63.8 mL/min (by C-G formula based on SCr of 0.49 mg/dL). Liver Function Tests: Recent Labs  Lab 09/13/17 1954  AST 19  ALT 16  ALKPHOS 55  BILITOT 0.8  PROT 6.4*  ALBUMIN 2.7*   Recent Labs  Lab 09/13/17 1954  LIPASE 18   No results for input(s): AMMONIA in the last 168 hours. Coagulation Profile: No results for input(s): INR, PROTIME in the last 168 hours. Cardiac Enzymes: No results for input(s): CKTOTAL, CKMB, CKMBINDEX, TROPONINI in the last 168 hours. BNP (last 3 results) No results for input(s): PROBNP in the last 8760 hours. HbA1C: No results for input(s): HGBA1C in the last 72 hours. CBG: No results for input(s): GLUCAP in the last 168 hours. Lipid Profile: No results for input(s): CHOL, HDL, LDLCALC, TRIG, CHOLHDL, LDLDIRECT in the last 72 hours. Thyroid Function Tests: No results for input(s): TSH, T4TOTAL, FREET4, T3FREE, THYROIDAB in the last 72 hours. Anemia Panel: Recent Labs    09/14/17 0427  VITAMINB12 653  FOLATE 14.3  FERRITIN 212  TIBC 207*  IRON 9*  RETICCTPCT 1.3   Sepsis Labs: No results for input(s): PROCALCITON, LATICACIDVEN in the last 168 hours.  Recent Results (from the past 240 hour(s))  MRSA PCR Screening     Status: None   Collection Time: 09/14/17 12:45 AM  Result Value Ref Range Status   MRSA by PCR NEGATIVE NEGATIVE Final    Comment:        The GeneXpert MRSA Assay (FDA approved for NASAL specimens only), is one component of a comprehensive MRSA colonization surveillance program. It is not intended to diagnose MRSA infection nor to guide or monitor treatment for MRSA infections. Performed at [facility], [address], [place], [place] [address]          Radiology Studies: Dg Chest 2 View  Result Date: 09/13/2017 CLINICAL DATA:  Fever and vomiting. EXAM: CHEST - 2 VIEW COMPARISON:  No comparison studies available.  FINDINGS: Bandlike opacity at the left base is likely atelectatic. Right lung clear. Cardiopericardial silhouette is at upper limits of normal for size. The visualized bony structures of the thorax are intact. IMPRESSION: Bandlike opacity at the left base is probably atelectasis although pneumonia not entirely excluded. Electronically Signed   By: Kennith Center M.D.   On: 09/13/2017 19:17   Ct Abdomen Pelvis W Contrast  Result Date: 09/13/2017 CLINICAL DATA:  Right upper quadrant pain EXAM: CT ABDOMEN AND PELVIS WITH CONTRAST TECHNIQUE: Multidetector CT imaging of the abdomen and pelvis was performed using the standard protocol following bolus administration of intravenous contrast. CONTRAST:  ISOVUE-300 IOPAMIDOL (ISOVUE-300) INJECTION 61% COMPARISON:  None. FINDINGS: Lower chest: No basilar pulmonary nodules or pleural effusion. No apical pericardial effusion. Hepatobiliary: Normal hepatic contours and density. No visible biliary dilatation. Normal gallbladder. Pancreas: Normal parenchymal contours without ductal dilatation. No peripancreatic fluid collection. Spleen: Normal. Adrenals/Urinary Tract: --Adrenal glands: Normal. --Right kidney/ureter:  There is hyperenhancement of the wall of the right renal pelvis, which is mildly dilated. No ureteral obstruction is identified. Calcification near the upper pole is more likely vascular. Mildly heterogeneous enhancement pattern of the renal parenchyma. --Left kidney/ureter: No hydronephrosis, perinephric stranding or nephrolithiasis. No obstructing ureteral stones. --Urinary bladder: Normal appearance for the degree of distention. Stomach/Bowel: --Stomach/Duodenum: No hiatal hernia or other gastric abnormality. Normal duodenal course. --Small bowel: No dilatation or inflammation. --Colon: There is a medium-size stool ball in the rectum with moderate surrounding wall thickening. The remainder of the colon is normal. --Appendix: Normal. Vascular/Lymphatic:  Atherosclerotic calcification is present within the non-aneurysmal abdominal aorta, without hemodynamically significant stenosis. The portal vein, splenic vein, superior mesenteric vein and IVC are patent. No abdominal or pelvic lymphadenopathy. Reproductive: Normal uterus and ovaries. Musculoskeletal. No bony spinal canal stenosis or focal osseous abnormality. Other: None. IMPRESSION: 1. Increased enhancement of the periphery of the right renal collecting system with mildly heterogeneous enhancement pattern of the renal parenchyma. This could indicate ascending urinary tract infection and/or pyelonephritis. Correlate with urinalysis results. 2. Mild wall thickening of the rectum with medium-size stool ball. This could indicate a mild or early distal colitis. 3.  Aortic Atherosclerosis (ICD10-I70.0). Electronically Signed   By: Deatra Robinson M.D.   On: 09/13/2017 22:12        Scheduled Meds: . aspirin EC  81 mg Oral Daily  . calcium carbonate  1 tablet Oral Daily  . divalproex  250 mg Oral BID  . divalproex  500 mg Oral BID  . lisinopril  10 mg Oral Daily  . tiZANidine  2 mg Oral BID  . [START ON 09/19/2017] Vitamin D (Ergocalciferol)  50,000 Units Oral Q7 days   Continuous Infusions: . cefTRIAXone (ROCEPHIN)  IV Stopped (09/14/17 2315)     LOS: 2 days    Time spent: 30 minutes    Jonny Dearden Hoover Brunette, DO [facility] Pager [phone]  If 7PM-7AM, please contact night-coverage [email or web address] Password [email or web address] 09/15/2017, 8:09 AM

## 2017-09-15 NOTE — Progress Notes (Signed)
CRITICAL VALUE ALERT  Critical Value:  + Blood Culture, Anaerobic bottle Gram - Rod  Date & Time Notied:  09/15/17 2330  Provider Notified: Bruna PotterBlount

## 2017-09-15 NOTE — Progress Notes (Signed)
Pharmacy Antibiotic Note  Lisa [name] is a 62 y.o. female admitted on 09/13/2017 with UTI and pyelonepritis and persistent fever.  Pharmacy has been consulted for Vancomycin and Zosyn dosing.   Plan: Vancomycin 1000 mg IV x 1 dose Vancomycin 500 mg IV every 12 hours.  Goal trough 15-20 mcg/mL. Zosyn 3.375g IV q8h (4 hour infusion).  Monitor labs, cultures, and vanco trough as needed  Height: 5\' 4"  (162.6 cm) Weight: 123 lb 7.3 oz (56 kg) IBW/kg (Calculated) : 54.7  Temp (24hrs), Avg:99.7 F (37.6 C), Min:98.1 F (36.7 C), Max:101.7 F (38.7 C)  Recent Labs  Lab 09/13/17 1954 09/14/17 0427 09/15/17 0435  WBC 12.4* 11.9* 11.8*  CREATININE 0.59 0.59 0.49    Estimated Creatinine Clearance: 63.8 mL/min (by C-G formula based on SCr of 0.49 mg/dL).    No Known Allergies  Antimicrobials this admission: Vanco 3/16 >>  Zosyn 3/16 >>  Ceftriaxone 3/15 >>3/16  Dose adjustments this admission: N/A  Microbiology results: 3/16 BCx: pending 3/16 UCx: pending  3/15 MRSA PCR: negative  Thank you for allowing pharmacy to be a part of this patient's care.  Tad MooreSteven C Bailei Buist 09/15/2017 9:22 AM

## 2017-09-16 LAB — BLOOD CULTURE ID PANEL (REFLEXED)
Acinetobacter baumannii: NOT DETECTED
CANDIDA TROPICALIS: NOT DETECTED
Candida albicans: NOT DETECTED
Candida glabrata: NOT DETECTED
Candida krusei: NOT DETECTED
Candida parapsilosis: NOT DETECTED
Carbapenem resistance: NOT DETECTED
ENTEROCOCCUS SPECIES: NOT DETECTED
ESCHERICHIA COLI: DETECTED — AB
Enterobacter cloacae complex: NOT DETECTED
Enterobacteriaceae species: DETECTED — AB
Haemophilus influenzae: NOT DETECTED
KLEBSIELLA OXYTOCA: NOT DETECTED
KLEBSIELLA PNEUMONIAE: NOT DETECTED
Listeria monocytogenes: NOT DETECTED
NEISSERIA MENINGITIDIS: NOT DETECTED
PROTEUS SPECIES: NOT DETECTED
PSEUDOMONAS AERUGINOSA: NOT DETECTED
SERRATIA MARCESCENS: NOT DETECTED
STAPHYLOCOCCUS AUREUS BCID: NOT DETECTED
STREPTOCOCCUS AGALACTIAE: NOT DETECTED
STREPTOCOCCUS SPECIES: NOT DETECTED
Staphylococcus species: NOT DETECTED
Streptococcus pneumoniae: NOT DETECTED
Streptococcus pyogenes: NOT DETECTED

## 2017-09-16 LAB — BASIC METABOLIC PANEL
Anion gap: 10 (ref 5–15)
BUN: 9 mg/dL (ref 6–20)
CHLORIDE: 100 mmol/L — AB (ref 101–111)
CO2: 29 mmol/L (ref 22–32)
CREATININE: 0.58 mg/dL (ref 0.44–1.00)
Calcium: 8.6 mg/dL — ABNORMAL LOW (ref 8.9–10.3)
GFR calc Af Amer: >60 mL/min (ref >60)
GFR calc non Af Amer: >60 mL/min (ref >60)
GLUCOSE: 93 mg/dL (ref 65–99)
POTASSIUM: 3.1 mmol/L — AB (ref 3.5–5.1)
Sodium: 139 mmol/L (ref 135–145)

## 2017-09-16 LAB — CBC WITH DIFFERENTIAL/PLATELET
Basophils Absolute: 0 10*3/uL (ref 0.0–0.1)
Basophils Relative: 0 %
Eosinophils Absolute: 0 10*3/uL (ref 0.0–0.7)
Eosinophils Relative: 1 %
HEMATOCRIT: 37.2 % (ref 36.0–46.0)
HEMOGLOBIN: 12 g/dL (ref 12.0–15.0)
LYMPHS ABS: 1.1 10*3/uL (ref 0.7–4.0)
Lymphocytes Relative: 13 %
MCH: 30.9 pg (ref 26.0–34.0)
MCHC: 32.3 g/dL (ref 30.0–36.0)
MCV: 95.9 fL (ref 78.0–100.0)
MONOS PCT: 11 %
Monocytes Absolute: 0.9 10*3/uL (ref 0.1–1.0)
NEUTROS PCT: 75 %
Neutro Abs: 6.4 10*3/uL (ref 1.7–7.7)
Platelets: 161 10*3/uL (ref 150–400)
RBC: 3.88 MIL/uL (ref 3.87–5.11)
RDW: 12.6 % (ref 11.5–15.5)
WBC: 8.4 10*3/uL (ref 4.0–10.5)

## 2017-09-16 LAB — VALPROIC ACID LEVEL: Valproic Acid Lvl: 82 ug/mL (ref 50.0–100.0)

## 2017-09-16 MED ORDER — POTASSIUM CHLORIDE 10 MEQ/100ML IV SOLN
10.0000 meq | INTRAVENOUS | Status: DC
  Administered 2017-09-16: 10 meq via INTRAVENOUS
  Filled 2017-09-16: qty 100

## 2017-09-16 MED ORDER — POTASSIUM CHLORIDE 10 MEQ/100ML IV SOLN
10.0000 meq | INTRAVENOUS | Status: AC
  Administered 2017-09-16 (×4): 10 meq via INTRAVENOUS
  Filled 2017-09-16 (×4): qty 100

## 2017-09-16 NOTE — Progress Notes (Signed)
CRITICAL VALUE ALERT  Critical Value:  Blood Culture ID- Enterobacteriaceae species and E.Coli  Date & Time Notied: 09/16/2017 0515

## 2017-09-16 NOTE — Progress Notes (Signed)
PROGRESS NOTE    Lisa [name]  ZOX:[id number] DOB: [birth year] DOA: 09/13/2017 PCP: Audree Bane, DO   Brief Narrative:   Lisa [name] is a 62 y.o. 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  CT scan appears suspicious for right-sided acute pyelonephritis and patient has been started on treatment with IV Rocephin which was changed to vancomycin and Zosyn; cultures thus far demonstrate growth of E. coli and Enterobacter.  Assessment & Plan:   Principal Problem:   Acute pyelonephritis Active Problems:   Hypokalemia   Anemia, unspecified   Hyperlipemia   Hypertension   Left spastic hemiplegia (HCC)   1. Acute right-sided pyelonephritis with E. coli and Enterobacter bacteremia.  Continue on IV Zosyn and discontinue IV vancomycin.  Transfer to general medical floor when bed available.  Fever and chills have improved.  Await further sensitivities for de-escalation of antibiotic. 2. Hypokalemia.  Replete with IV and recheck in a.m. 3. History of seizures on Depakote.  Patient was concerned about her current Depakote dosing, but this appears to be appropriate.  Will check a Depakote level. 4. Dyslipidemia.  Not currently on medical therapy.  Continue lifestyle modifications. 5. Left spastic hemiplegia.  This is chronic.  Continue supportive care as well as muscle relaxants. 6. Hypertension.  Continue lisinopril 10 mg p.o. daily, this is otherwise well controlled.   DVT prophylaxis:Lovenox Code Status: Full Family Communication: None Disposition Plan: IV antibiotic therapy  ongoing for bacteremia treatment with further ID and sensitivity pending.  Discharge to SNF once this is determined and patient afebrile.   Consultants:   None  Procedures:   None  Antimicrobials:   Rocephin 3/14->3/16  Vancomycin 3/16->3/17  Zosyn 3/16->   Subjective: Patient seen and evaluated today with no further chills or fever noted at this time.  Blood cultures have returned with Enterobacter as well as E. coli.  ID and sensitivity currently pending.  No acute overnight events otherwise noted.  She continues to remain hypokalemic.  Objective: Vitals:   09/16/17 0000 09/16/17 0400 09/16/17 0743 09/16/17 1111  BP: (!) 138/54 131/64    Pulse: 61 80 91 69  Resp: 20 16 (!) 27 20  Temp:  98.3 F (36.8 C) 98.3 F (36.8 C) 98.4 F (36.9 C)  TempSrc:  Oral Oral Oral  SpO2: 97% 97% 98% 97%  Weight:      Height:        Intake/Output Summary (Last 24 hours) at 09/16/2017 1212 Last data filed at 09/16/2017 0500 Gross per 24 hour  Intake 600 ml  Output 2100 ml  Net -1500 ml   Filed Weights   09/14/17 0128 09/14/17 0500 09/15/17 0547  Weight: 54.8 kg (120 lb 13 oz) 54.8 kg (120 lb 13 oz) 56 kg (123 lb 7.3 oz)    Examination:  General exam: Appears calm and comfortable  Respiratory system: Clear to auscultation. Respiratory effort normal. Cardiovascular system: S1 & S2 heard, RRR. No JVD, murmurs, rubs, gallops or clicks. No pedal edema. Gastrointestinal system: Abdomen is nondistended, soft and nontender. No organomegaly or masses felt. Normal bowel sounds heard. Central nervous system: Alert and oriented. No focal neurological deficits. Extremities: Symmetric 5 x 5 power. Skin: No rashes,  lesions or ulcers Psychiatry: Judgement and insight appear normal. Mood & affect appropriate.    Data Reviewed: I have personally reviewed following labs and imaging studies  CBC: Recent Labs  Lab 09/13/17 1954 09/14/17 0427 09/15/17 0435 09/16/17 0628  WBC 12.4* 11.9*  11.8* 8.4  NEUTROABS 9.9 9.2* 8.8* 6.4  HGB 11.6* 10.0* 12.0 12.0  HCT 36.7 32.4* 38.3 37.2  MCV 98.7 99.4 99.0 95.9  PLT 146* 151 145* 161   Basic Metabolic Panel: Recent Labs  Lab 09/13/17 1954 09/14/17 0427 09/15/17 0435 09/16/17 0628  NA 141 138 137 139  K 3.4* 3.3* 3.7 3.1*  CL 106 106 104 100*  CO2 23 22 21* 29  GLUCOSE 178* 161* 81 93  BUN 16 11 7 9   CREATININE 0.59 0.59 0.49 0.58  CALCIUM 8.3* 7.5* 8.5* 8.6*  MG  --   --  1.7  --    GFR: Estimated Creatinine Clearance: 63.8 mL/min (by C-G formula based on SCr of 0.58 mg/dL). Liver Function Tests: Recent Labs  Lab 09/13/17 1954  AST 19  ALT 16  ALKPHOS 55  BILITOT 0.8  PROT 6.4*  ALBUMIN 2.7*   Recent Labs  Lab 09/13/17 1954  LIPASE 18   No results for input(s): AMMONIA in the last 168 hours. Coagulation Profile: No results for input(s): INR, PROTIME in the last 168 hours. Cardiac Enzymes: No results for input(s): CKTOTAL, CKMB, CKMBINDEX, TROPONINI in the last 168 hours. BNP (last 3 results) No results for input(s): PROBNP in the last 8760 hours. HbA1C: No results for input(s): HGBA1C in the last 72 hours. CBG: No results for input(s): GLUCAP in the last 168 hours. Lipid Profile: No results for input(s): CHOL, HDL, LDLCALC, TRIG, CHOLHDL, LDLDIRECT in the last 72 hours. Thyroid Function Tests: No results for input(s): TSH, T4TOTAL, FREET4, T3FREE, THYROIDAB in the last 72 hours. Anemia Panel: Recent Labs    09/14/17 0427  VITAMINB12 653  FOLATE 14.3  FERRITIN 212  TIBC 207*  IRON 9*  RETICCTPCT 1.3   Sepsis Labs: No results for input(s): PROCALCITON, LATICACIDVEN in the last 168 hours.  Recent Results (from the past 240 hour(s))  MRSA PCR Screening     Status: None   Collection Time: 09/14/17 12:45 AM  Result Value Ref Range Status   MRSA by PCR NEGATIVE NEGATIVE Final    Comment:        The GeneXpert MRSA Assay (FDA approved for NASAL specimens only), is one component of  a comprehensive MRSA colonization surveillance program. It is not intended to diagnose MRSA infection nor to guide or monitor treatment for MRSA infections. Performed at [facility], [address], [place], [place] [address]   Culture, blood (routine x 2)     Status: None (Preliminary result)   Collection Time: 09/15/17  9:09 AM  Result Value Ref Range Status   Specimen Description BLOOD LEFT HAND  Final   Special Requests   Final    BOTTLES DRAWN AEROBIC AND ANAEROBIC Blood Culture adequate volume   Culture   Final    NO GROWTH < 24 HOURS Performed at [facility], [address], [place], [place] [address]    Report Status PENDING  Incomplete  Culture, blood (routine x 2)     Status: None (Preliminary result)   Collection Time: 09/15/17  9:09 AM  Result Value Ref Range Status   Specimen Description   Final    BLOOD LEFT WRIST Performed at [facility], [address]  [place], [place] [address]    Special Requests   Final    BOTTLES DRAWN AEROBIC AND ANAEROBIC Blood Culture adequate volume Performed at [facility], [address] [place], [place] [address]    Culture  Setup Time   Final    GRAM NEGATIVE RODS Gram Stain Report Called to,Read Back By and Verified With: HOWARD C. AT 2329 ON [id number] BY THOMPSON S. CRITICAL RESULT CALLED TO, READ BACK BY AND VERIFIED WITH: C.HOWARD,RN [id number] 09/16/17 M.CAMPBELL Performed at [facility], [address] [place], [place] [address]    Culture GRAM NEGATIVE RODS  Final   Report Status PENDING  Incomplete  Blood Culture ID Panel (Reflexed)     Status: Abnormal   Collection Time: 09/15/17  9:09 AM  Result Value Ref Range Status   Enterococcus species NOT DETECTED NOT DETECTED Final   Listeria monocytogenes NOT DETECTED NOT DETECTED Final   Staphylococcus species NOT DETECTED NOT DETECTED Final   Staphylococcus aureus NOT DETECTED NOT DETECTED Final   Streptococcus species NOT DETECTED NOT DETECTED Final   Streptococcus  agalactiae NOT DETECTED NOT DETECTED Final   Streptococcus pneumoniae NOT DETECTED NOT DETECTED Final   Streptococcus pyogenes NOT DETECTED NOT DETECTED Final   Acinetobacter baumannii NOT DETECTED NOT DETECTED Final   Enterobacteriaceae species DETECTED (A) NOT DETECTED Final    Comment: Enterobacteriaceae represent a large family of gram-negative bacteria, not a single organism. CRITICAL RESULT CALLED TO, READ BACK BY AND VERIFIED WITH: C.HOWARD,RN [id number] 09/16/17 M.CAMPBELL    Enterobacter cloacae complex NOT DETECTED NOT DETECTED Final   Escherichia coli DETECTED (A) NOT DETECTED Final    Comment: CRITICAL RESULT CALLED TO, READ BACK BY AND VERIFIED WITH: C.HOWARD,RN [id number] 09/16/17 M.CAMPBELL    Klebsiella oxytoca NOT DETECTED NOT DETECTED Final   Klebsiella pneumoniae NOT DETECTED NOT DETECTED Final   Proteus species NOT DETECTED NOT DETECTED Final   Serratia marcescens NOT DETECTED NOT DETECTED Final   Carbapenem resistance NOT DETECTED NOT DETECTED Final   Haemophilus influenzae NOT DETECTED NOT DETECTED Final   Neisseria meningitidis NOT DETECTED NOT DETECTED Final   Pseudomonas aeruginosa NOT DETECTED NOT DETECTED Final   Candida albicans NOT DETECTED NOT DETECTED Final   Candida glabrata NOT DETECTED NOT DETECTED Final   Candida krusei NOT DETECTED NOT DETECTED Final   Candida parapsilosis NOT DETECTED NOT DETECTED Final   Candida tropicalis NOT DETECTED NOT DETECTED Final    Comment: Performed at [facility], [address] [place], [place] [address]       Radiology Studies: No results found.    Scheduled Meds: . aspirin EC  81 mg Oral Daily  . calcium carbonate  1 tablet Oral Daily  . divalproex  250 mg Oral BID  . divalproex  500 mg Oral BID  . tiZANidine  2 mg Oral BID  . [START ON 09/19/2017] Vitamin D (Ergocalciferol)  50,000 Units Oral Q7 days   Continuous Infusions: . piperacillin-tazobactam (ZOSYN)  IV 3.375 g (09/16/17 0531)  . potassium chloride  10 mEq (09/16/17 1110)     LOS: 3 days    Time spent: 30 minutes    Pratik Hoover Brunette Shah, DO [facility] Pager [phone]  If 7PM-7AM, please contact night-coverage [email or web address] Password [email or web address] 09/16/2017, 12:12 PM

## 2017-09-17 LAB — CBC WITH DIFFERENTIAL/PLATELET
Basophils Absolute: 0 10*3/uL (ref 0.0–0.1)
Basophils Relative: 0 %
EOS PCT: 1 %
Eosinophils Absolute: 0.1 10*3/uL (ref 0.0–0.7)
HEMATOCRIT: 37.6 % (ref 36.0–46.0)
Hemoglobin: 12.3 g/dL (ref 12.0–15.0)
LYMPHS ABS: 1.5 10*3/uL (ref 0.7–4.0)
Lymphocytes Relative: 20 %
MCH: 31.2 pg (ref 26.0–34.0)
MCHC: 32.7 g/dL (ref 30.0–36.0)
MCV: 95.4 fL (ref 78.0–100.0)
MONOS PCT: 11 %
Monocytes Absolute: 0.8 10*3/uL (ref 0.1–1.0)
Neutro Abs: 5.3 10*3/uL (ref 1.7–7.7)
Neutrophils Relative %: 68 %
PLATELETS: 175 10*3/uL (ref 150–400)
RBC: 3.94 MIL/uL (ref 3.87–5.11)
RDW: 12.8 % (ref 11.5–15.5)
WBC: 7.7 10*3/uL (ref 4.0–10.5)

## 2017-09-17 LAB — BASIC METABOLIC PANEL
Anion gap: 12 (ref 5–15)
BUN: 11 mg/dL (ref 6–20)
CHLORIDE: 98 mmol/L — AB (ref 101–111)
CO2: 26 mmol/L (ref 22–32)
Calcium: 8.7 mg/dL — ABNORMAL LOW (ref 8.9–10.3)
Creatinine, Ser: 0.61 mg/dL (ref 0.44–1.00)
GFR calc Af Amer: >60 mL/min (ref >60)
GFR calc non Af Amer: >60 mL/min (ref >60)
GLUCOSE: 82 mg/dL (ref 65–99)
POTASSIUM: 3.2 mmol/L — AB (ref 3.5–5.1)
Sodium: 136 mmol/L (ref 135–145)

## 2017-09-17 LAB — MAGNESIUM: Magnesium: 1.9 mg/dL (ref 1.7–2.4)

## 2017-09-17 MED ORDER — DIVALPROEX SODIUM 250 MG PO DR TAB
750.0000 mg | DELAYED_RELEASE_TABLET | Freq: Two times a day (BID) | ORAL | Status: DC
  Administered 2017-09-17: 750 mg via ORAL
  Filled 2017-09-17 (×2): qty 3

## 2017-09-17 MED ORDER — POTASSIUM CHLORIDE 20 MEQ/15ML (10%) PO SOLN
40.0000 meq | Freq: Once | ORAL | Status: AC
  Administered 2017-09-17: 40 meq via ORAL
  Filled 2017-09-17: qty 30

## 2017-09-17 MED ORDER — POTASSIUM CHLORIDE 10 MEQ/100ML IV SOLN
10.0000 meq | INTRAVENOUS | Status: AC
  Administered 2017-09-17 (×5): 10 meq via INTRAVENOUS
  Filled 2017-09-17 (×4): qty 100

## 2017-09-17 NOTE — Progress Notes (Signed)
PROGRESS NOTE    Lisa Shirelizabeth Guzek  ZOX:[id number] DOB: [birth year] DOA: 09/13/2017 PCP: Audree BaneKing, Peter, DO   Brief Narrative:   Lisa [name] is a 62 y.o. 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  CT scan appears suspicious for right-sided acute pyelonephritis and patient has been started on treatment with IV Rocephin which was changed to vancomycin and Zosyn; cultures thus far demonstrate growth of E. coli and Enterobacter.  She currently remains on Zosyn.  Assessment & Plan:   Principal Problem:   Acute pyelonephritis Active Problems:   Hypokalemia   Anemia, unspecified   Hyperlipemia   Hypertension   Left spastic hemiplegia (HCC)   1. Acute right-sided pyelonephritis with E. coli and Enterobacter bacteremia.  Continue on IV Zosyn.  Spoke with microbiology lab with sensitivity panel pending at which point I will repeat blood cultures.  Transfer to general medical floor when bed available.  Fever and chills have improved.  2. Hypokalemia.  Continue repletion and recheck magnesium level.  This appears to be persistent. 3. History of seizures on Depakote.  Patient was concerned about her current Depakote dosing, but this appears to be appropriate.  Will check a Depakote level which was noted to be therapeutic.  [facility] with [facility] about dose as well as timing and the dose appears to be appropriate, but will adjust timing to satisfy patient. 4. Dyslipidemia.  Not currently on medical therapy.  Continue lifestyle modifications. 5. Left spastic hemiplegia.  This is chronic.   Continue supportive care as well as muscle relaxants. 6. Hypertension.  Continue lisinopril 10 mg p.o. daily, this is otherwise well controlled.   DVT prophylaxis:Lovenox Code Status: Full Family Communication: None Disposition Plan: IV antibiotic therapy ongoing for bacteremia treatment with further ID and sensitivity pending.  Discharge to SNF once this is determined and patient afebrile.   Consultants:   None  Procedures:   None  Antimicrobials:   Rocephin 3/14->3/16  Vancomycin 3/16->3/17  Zosyn 3/16->   Subjective: Patient seen and evaluated today with no further chills or fever noted at this time.  Blood cultures have returned with Enterobacter as well as E. coli.  ID and sensitivity currently pending.  No acute overnight events otherwise noted.  She continues to remain hypokalemic.  She is very concerned about having another seizure and wants to make sure that her Depakote dosing is correct as well as her timing.  Objective: Vitals:   09/17/17 0000 09/17/17 0400 09/17/17 0700 09/17/17 0800  BP: (!) 109/47 (!) 103/56  129/60  Pulse: 64 76 94 97  Resp: 17 19 20  (!) 21  Temp:  97.8 F (36.6 C)    TempSrc:  Oral    SpO2: 94% 97% 97% 96%  Weight:      Height:        Intake/Output Summary (Last 24 hours) at 09/17/2017 1024 Last data filed at 09/17/2017 0523 Gross per 24 hour  Intake 672 ml  Output 1600 ml  Net -928 ml   Filed Weights   09/14/17 0128 09/14/17 0500 09/15/17 0547  Weight: 54.8 kg (120 lb 13 oz) 54.8 kg (120 lb 13 oz) 56 kg (123 lb 7.3 oz)    Examination:  General exam: Appears calm and comfortable  Respiratory system: Clear to auscultation. Respiratory effort normal. Cardiovascular system: S1 & S2 heard, RRR. No JVD, murmurs, rubs, gallops or clicks. No pedal edema. Gastrointestinal system: Abdomen is nondistended, soft and nontender. No organomegaly or masses felt. Normal bowel sounds heard. Central nervous system: Alert and oriented. No  focal neurological deficits. Extremities: Symmetric 5 x 5 power. Skin: No rashes, lesions or ulcers Psychiatry: Judgement and insight appear normal. Mood & affect appropriate.    Data Reviewed: I have personally reviewed following labs and imaging studies  CBC: Recent Labs  Lab 09/13/17 1954 09/14/17 0427 09/15/17 0435 09/16/17 0628 09/17/17 0502  WBC 12.4* 11.9* 11.8* 8.4 7.7  NEUTROABS 9.9 9.2* 8.8* 6.4 5.3  HGB 11.6* 10.0* 12.0 12.0 12.3  HCT 36.7 32.4* 38.3 37.2 37.6  MCV 98.7 99.4 99.0 95.9 95.4  PLT 146* 151 145* 161 175   Basic Metabolic Panel: Recent Labs  Lab 09/13/17 1954 09/14/17 0427 09/15/17 0435 09/16/17 0628 09/17/17 0502  NA 141 138 137 139 136  K 3.4* 3.3* 3.7 3.1* 3.2*  CL 106 106 104 100* 98*  CO2 23 22 21* 29 26  GLUCOSE 178* 161* 81 93 82  BUN 16 11 7 9 11   CREATININE 0.59 0.59 0.49 0.58 0.61  CALCIUM 8.3* 7.5* 8.5* 8.6* 8.7*  MG  --   --  1.7  --  1.9   GFR: Estimated Creatinine Clearance: 63.8 mL/min (by C-G formula based on SCr of 0.61 mg/dL). Liver Function Tests: Recent Labs  Lab 09/13/17 1954  AST 19  ALT 16  ALKPHOS 55  BILITOT 0.8  PROT 6.4*  ALBUMIN 2.7*   Recent Labs  Lab 09/13/17 1954  LIPASE 18   No results for input(s): AMMONIA in the last 168 hours. Coagulation Profile: No results for input(s): INR, PROTIME in the last 168 hours. Cardiac Enzymes: No results for input(s): CKTOTAL, CKMB, CKMBINDEX, TROPONINI in the last 168 hours. BNP (last 3 results) No results for input(s): PROBNP in the last 8760 hours. HbA1C: No results for input(s): HGBA1C in the last 72 hours. CBG: No results for input(s): GLUCAP in the last 168 hours. Lipid Profile: No results for input(s): CHOL, HDL, LDLCALC, TRIG, CHOLHDL, LDLDIRECT in the last 72 hours. Thyroid Function Tests: No results for input(s): TSH, T4TOTAL, FREET4, T3FREE, THYROIDAB in the last 72 hours. Anemia Panel: No results for input(s): VITAMINB12, FOLATE, FERRITIN,  TIBC, IRON, RETICCTPCT in the last 72 hours. Sepsis Labs: No results for input(s): PROCALCITON, LATICACIDVEN in the last 168 hours.  Recent Results (from the past 240 hour(s))  MRSA PCR Screening     Status: None   Collection Time: 09/14/17 12:45 AM  Result Value Ref Range Status   MRSA by PCR NEGATIVE NEGATIVE Final    Comment:        The GeneXpert MRSA Assay (FDA approved for NASAL specimens only), is one component of a comprehensive MRSA colonization surveillance program. It is not intended to diagnose MRSA infection nor to guide or monitor treatment for MRSA infections. Performed at [facility], [address], [place], [place] [address]   Culture, Urine     Status: Abnormal (Preliminary result)   Collection Time: 09/15/17  8:15 AM  Result Value Ref Range Status   Specimen Description   Final    URINE, CLEAN CATCH Performed at [facility], [address], [place], [place] [address]    Special Requests   Final    NONE Performed at  [facility], [address], [place], [place] [address]    Culture 40,000 COLONIES/mL GRAM NEGATIVE RODS (A)  Final   Report Status PENDING  Incomplete  Culture, blood (routine x 2)     Status: None (Preliminary result)   Collection Time: 09/15/17  9:09 AM  Result Value Ref Range Status   Specimen Description BLOOD LEFT HAND  Final   Special Requests   Final    BOTTLES DRAWN AEROBIC AND ANAEROBIC Blood Culture adequate volume   Culture   Final    NO GROWTH 2 DAYS Performed at [facility], [address] [place], [place] [address]    Report Status PENDING  Incomplete  Culture, blood (routine x 2)     Status: Abnormal (Preliminary result)   Collection Time: 09/15/17  9:09 AM  Result Value Ref Range Status   Specimen Description   Final    BLOOD LEFT WRIST Performed at [facility], [address], [place], [place] [address]    Special Requests   Final    BOTTLES DRAWN AEROBIC AND ANAEROBIC Blood Culture adequate volume Performed at  [facility], [address] [place], [place] [address]    Culture  Setup Time   Final    GRAM NEGATIVE RODS Gram Stain Report Called to,Read Back By and Verified With: HOWARD C. AT 2329 ON [id number] BY THOMPSON S. CRITICAL RESULT CALLED TO, READ BACK BY AND VERIFIED WITH: C.HOWARD,RN [id number] 09/16/17 M.CAMPBELL Performed at [facility], [address], [place], [place] [address]    Culture ESCHERICHIA COLI (A)  Final   Report Status PENDING  Incomplete  Blood Culture ID Panel (Reflexed)     Status: Abnormal   Collection Time: 09/15/17  9:09 AM  Result Value Ref Range Status   Enterococcus species NOT DETECTED NOT DETECTED Final   Listeria monocytogenes NOT DETECTED NOT DETECTED Final   Staphylococcus species NOT DETECTED NOT DETECTED Final   Staphylococcus aureus NOT DETECTED NOT DETECTED Final   Streptococcus species NOT DETECTED NOT DETECTED Final   Streptococcus agalactiae NOT DETECTED NOT DETECTED Final   Streptococcus pneumoniae NOT DETECTED NOT DETECTED Final   Streptococcus pyogenes NOT DETECTED NOT DETECTED Final   Acinetobacter baumannii NOT DETECTED NOT DETECTED Final   Enterobacteriaceae species DETECTED (A) NOT DETECTED Final    Comment: Enterobacteriaceae represent a large family of gram-negative bacteria, not a single organism. CRITICAL RESULT CALLED TO, READ BACK BY AND VERIFIED WITH: C.HOWARD,RN [id number] 09/16/17 M.CAMPBELL    Enterobacter cloacae complex NOT DETECTED NOT DETECTED Final   Escherichia coli DETECTED (A) NOT DETECTED Final    Comment: CRITICAL RESULT CALLED TO, READ BACK BY AND VERIFIED WITH: C.HOWARD,RN [id number] 09/16/17 M.CAMPBELL    Klebsiella oxytoca NOT DETECTED NOT DETECTED Final   Klebsiella pneumoniae NOT DETECTED NOT DETECTED Final   Proteus species NOT DETECTED NOT DETECTED Final   Serratia marcescens NOT DETECTED NOT DETECTED Final   Carbapenem resistance NOT DETECTED NOT DETECTED Final   Haemophilus influenzae NOT DETECTED NOT DETECTED Final    Neisseria meningitidis NOT DETECTED NOT DETECTED Final   Pseudomonas aeruginosa NOT DETECTED NOT DETECTED Final   Candida albicans NOT DETECTED NOT DETECTED Final   Candida glabrata NOT DETECTED NOT DETECTED Final   Candida krusei NOT DETECTED NOT DETECTED Final   Candida parapsilosis NOT DETECTED NOT DETECTED Final   Candida tropicalis NOT DETECTED NOT DETECTED Final    Comment: Performed at [facility], [address] [name], [place] [address]  Radiology Studies: No results found.    Scheduled Meds: . aspirin EC  81 mg Oral Daily  . calcium carbonate  1 tablet Oral Daily  . divalproex  750 mg Oral Q12H  . tiZANidine  2 mg Oral BID  . [START ON 09/19/2017] Vitamin D (Ergocalciferol)  50,000 Units Oral Q7 days   Continuous Infusions: . piperacillin-tazobactam (ZOSYN)  IV Stopped (09/17/17 0923)  . potassium chloride 10 mEq (09/17/17 0945)     LOS: 4 days    Time spent: 30 minutes    Corine Solorio Hoover Brunette, DO [facility] Pager [phone]  If 7PM-7AM, please contact night-coverage [email or web address] Password [facility] 09/17/2017, 10:24 AM

## 2017-09-18 LAB — CBC WITH DIFFERENTIAL/PLATELET
BASOS PCT: 1 %
Basophils Absolute: 0.1 10*3/uL (ref 0.0–0.1)
EOS PCT: 2 %
Eosinophils Absolute: 0.2 10*3/uL (ref 0.0–0.7)
HEMATOCRIT: 39.9 % (ref 36.0–46.0)
Hemoglobin: 12.9 g/dL (ref 12.0–15.0)
LYMPHS ABS: 2 10*3/uL (ref 0.7–4.0)
Lymphocytes Relative: 25 %
MCH: 30.8 pg (ref 26.0–34.0)
MCHC: 32.3 g/dL (ref 30.0–36.0)
MCV: 95.2 fL (ref 78.0–100.0)
MONO ABS: 0.8 10*3/uL (ref 0.1–1.0)
MONOS PCT: 10 %
NEUTROS ABS: 4.8 10*3/uL (ref 1.7–7.7)
Neutrophils Relative %: 62 %
PLATELETS: 161 10*3/uL (ref 150–400)
RBC: 4.19 MIL/uL (ref 3.87–5.11)
RDW: 13 % (ref 11.5–15.5)
WBC: 7.9 10*3/uL (ref 4.0–10.5)

## 2017-09-18 LAB — CULTURE, BLOOD (ROUTINE X 2): SPECIAL REQUESTS: ADEQUATE

## 2017-09-18 LAB — BASIC METABOLIC PANEL
ANION GAP: 11 (ref 5–15)
BUN: 12 mg/dL (ref 6–20)
CO2: 24 mmol/L (ref 22–32)
CREATININE: 0.66 mg/dL (ref 0.44–1.00)
Calcium: 8.6 mg/dL — ABNORMAL LOW (ref 8.9–10.3)
Chloride: 100 mmol/L — ABNORMAL LOW (ref 101–111)
GFR calc Af Amer: >60 mL/min (ref >60)
GLUCOSE: 95 mg/dL (ref 65–99)
Potassium: 4.1 mmol/L (ref 3.5–5.1)
Sodium: 135 mmol/L (ref 135–145)

## 2017-09-18 MED ORDER — SODIUM CHLORIDE 0.9 % IV SOLN: 1.0000 g | Freq: Three times a day (TID) | INTRAVENOUS | 0 refills | Status: DC

## 2017-09-18 MED ORDER — LEVETIRACETAM 500 MG PO TABS: 500.0000 mg | ORAL_TABLET | Freq: Two times a day (BID) | ORAL | 0 refills | Status: DC

## 2017-09-18 MED ORDER — SODIUM CHLORIDE 0.9 % IV SOLN
1.0000 g | Freq: Three times a day (TID) | INTRAVENOUS | Status: DC
  Administered 2017-09-18: 1 g via INTRAVENOUS
  Filled 2017-09-18 (×4): qty 1

## 2017-09-18 MED ORDER — DIVALPROEX SODIUM 250 MG PO DR TAB
750.0000 mg | DELAYED_RELEASE_TABLET | Freq: Two times a day (BID) | ORAL | Status: DC
  Administered 2017-09-18: 750 mg via ORAL
  Filled 2017-09-18: qty 3

## 2017-09-18 MED ORDER — LEVETIRACETAM 500 MG PO TABS
500.0000 mg | ORAL_TABLET | Freq: Two times a day (BID) | ORAL | Status: DC
  Administered 2017-09-18: 500 mg via ORAL
  Filled 2017-09-18: qty 1

## 2017-09-18 NOTE — Discharge Summary (Signed)
Physician Discharge Summary  Stephinie Battisti ZOX:[id number] DOB: Jun 03, 1956 DOA: 09/13/2017  PCP: Audree Bane, DO  Admit date: 09/13/2017  Discharge date: 09/18/2017  Admitted From:SNF  Disposition:  SNF  Recommendations for Outpatient Follow-up:  1. 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  CT scan appears suspicious for right-sided acute pyelonephritis and patient has been started on treatment with IV Rocephin which was changed to vancomycin and Zosyn; cultures demonstrated growth of E. coli and her urine as well as her blood and she was maintained on Zosyn.  Ultimately on cultures, it was noted that there was intermediate resistance to the Zosyn and that she was ESBL positive.  Dr. Judyann Munson of infectious disease was then contacted regarding course of therapy and she was recommended to remain on Merrem for 7 more days.  Due to the fact that this medication could decrease her Depakote levels, Keppra will be added twice daily for the duration of while she is on this medication.  She has received a PICC line at this time and will continue on 7 days of this treatment to finish the course.   Discharge Diagnoses:  Principal Problem:   Acute pyelonephritis Active Problems:   Hypokalemia   Anemia, unspecified   Hyperlipemia   Hypertension   Left spastic  hemiplegia (HCC)  1. Acute right-sided pyelonephritis with E. coli  bacteremia.  Discharged on 7-day course of IV Merrem with PICC line placed per ID recommendations. 2. History of seizures on Depakote.    Patient will also be initiated on Keppra while she is on Merrem for the next 7 days.  This is due to the fact that Depakote levels will become likely less than therapeutic while on this medication. 3. Dyslipidemia.  Not currently on medical therapy.  Continue lifestyle modifications. 4. Left spastic hemiplegia.  This is chronic.  Continue supportive care as well as muscle relaxants. 5. Hypertension.  Continue lisinopril 10 mg p.o. daily, this is otherwise well controlled.   Discharge Instructions  Discharge Instructions    Diet - low sodium heart healthy   Complete by:  As directed    Increase activity slowly   Complete by:  As directed      Allergies as of 09/18/2017   No Known Allergies     Medication List    TAKE these medications   acetaminophen 650 MG CR tablet Commonly known as:  TYLENOL Take 650 mg by mouth every 8 (eight) hours as needed for pain.   aspirin EC 81 MG tablet Take 81 mg by mouth daily.   calcium carbonate 500 MG chewable tablet Commonly known as:  TUMS - dosed in mg elemental calcium Chew 1 tablet by mouth daily.   divalproex 250 MG DR tablet Commonly known as:  DEPAKOTE Take 250 mg by mouth 2 (two) times daily.   divalproex 500 MG DR tablet Commonly known as:  DEPAKOTE Take 500 mg by mouth 2 (two) times daily.   levETIRAcetam 500 MG tablet Commonly known as:  KEPPRA Take 1 tablet (500  mg total) by mouth 2 (two) times daily for 7 days.   lisinopril 10 MG tablet Commonly known as:  PRINIVIL,ZESTRIL Take 10 mg by mouth daily.   meropenem 1 g in sodium chloride 0.9 % 100 mL Inject 1 g into the vein every 8 (eight) hours for 21 doses.   tizanidine 2 MG capsule Commonly known as:  ZANAFLEX Take 2 mg by mouth 2 (two) times daily.   Vitamin D  (Ergocalciferol) 50000 units Caps capsule Commonly known as:  DRISDOL Take 50,000 Units by mouth every 7 (seven) days.       No Known Allergies  Consultations:  ID Dr. Drue Second on phone   Procedures/Studies: Dg Chest 2 View  Result Date: 09/13/2017 CLINICAL DATA:  Fever and vomiting. EXAM: CHEST - 2 VIEW COMPARISON:  No comparison studies available. FINDINGS: Bandlike opacity at the left base is likely atelectatic. Right lung clear. Cardiopericardial silhouette is at upper limits of normal for size. The visualized bony structures of the thorax are intact. IMPRESSION: Bandlike opacity at the left base is probably atelectasis although pneumonia not entirely excluded. Electronically Signed   By: Kennith Center M.D.   On: 09/13/2017 19:17   Ct Abdomen Pelvis W Contrast  Result Date: 09/13/2017 CLINICAL DATA:  Right upper quadrant pain EXAM: CT ABDOMEN AND PELVIS WITH CONTRAST TECHNIQUE: Multidetector CT imaging of the abdomen and pelvis was performed using the standard protocol following bolus administration of intravenous contrast. CONTRAST:  ISOVUE-300 IOPAMIDOL (ISOVUE-300) INJECTION 61% COMPARISON:  None. FINDINGS: Lower chest: No basilar pulmonary nodules or pleural effusion. No apical pericardial effusion. Hepatobiliary: Normal hepatic contours and density. No visible biliary dilatation. Normal gallbladder. Pancreas: Normal parenchymal contours without ductal dilatation. No peripancreatic fluid collection. Spleen: Normal. Adrenals/Urinary Tract: --Adrenal glands: Normal. --Right kidney/ureter: There is hyperenhancement of the wall of the right renal pelvis, which is mildly dilated. No ureteral obstruction is identified. Calcification near the upper pole is more likely vascular. Mildly heterogeneous enhancement pattern of the renal parenchyma. --Left kidney/ureter: No hydronephrosis, perinephric stranding or nephrolithiasis. No obstructing ureteral stones. --Urinary bladder: Normal  appearance for the degree of distention. Stomach/Bowel: --Stomach/Duodenum: No hiatal hernia or other gastric abnormality. Normal duodenal course. --Small bowel: No dilatation or inflammation. --Colon: There is a medium-size stool ball in the rectum with moderate surrounding wall thickening. The remainder of the colon is normal. --Appendix: Normal. Vascular/Lymphatic: Atherosclerotic calcification is present within the non-aneurysmal abdominal aorta, without hemodynamically significant stenosis. The portal vein, splenic vein, superior mesenteric vein and IVC are patent. No abdominal or pelvic lymphadenopathy. Reproductive: Normal uterus and ovaries. Musculoskeletal. No bony spinal canal stenosis or focal osseous abnormality. Other: None. IMPRESSION: 1. Increased enhancement of the periphery of the right renal collecting system with mildly heterogeneous enhancement pattern of the renal parenchyma. This could indicate ascending urinary tract infection and/or pyelonephritis. Correlate with urinalysis results. 2. Mild wall thickening of the rectum with medium-size stool ball. This could indicate a mild or early distal colitis. 3.  Aortic Atherosclerosis (ICD10-I70.0). Electronically Signed   By: Deatra Robinson M.D.   On: 09/13/2017 22:12   Discharge Exam: Vitals:   09/18/17 0100 09/18/17 0400  BP: 134/71 (!) 151/89  Pulse:  85  Resp:  18  Temp:  98.4 F (36.9 C)  SpO2:  99%   Vitals:   09/17/17 1331 09/17/17 2100 09/18/17 0100 09/18/17 0400  BP: (!) 156/78 (!) 192/87 134/71 (!) 151/89  Pulse: 80 81  85  Resp: 18 16  18   Temp:  98.6 F (37 C) 98.9 F (37.2 C)  98.4 F (36.9 C)  TempSrc: Oral Oral  Oral  SpO2: 100% 100%  99%  Weight:      Height:        General: Pt is alert, awake, not in acute distress Cardiovascular: RRR, S1/S2 +, no rubs, no gallops Respiratory: CTA bilaterally, no wheezing, no rhonchi Abdominal: Soft, NT, ND, bowel sounds + Extremities: no edema, no cyanosis    The  results of significant diagnostics from this hospitalization (including imaging, microbiology, ancillary and laboratory) are listed below for reference.     Microbiology: Recent Results (from the past 240 hour(s))  MRSA PCR Screening     Status: None   Collection Time: 09/14/17 12:45 AM  Result Value Ref Range Status   MRSA by PCR NEGATIVE NEGATIVE Final    Comment:        The GeneXpert MRSA Assay (FDA approved for NASAL specimens only), is one component of a comprehensive MRSA colonization surveillance program. It is not intended to diagnose MRSA infection nor to guide or monitor treatment for MRSA infections. Performed at [facility], [address] [place], [place] [address]   Culture, Urine     Status: Abnormal (Preliminary result)   Collection Time: 09/15/17  8:15 AM  Result Value Ref Range Status   Specimen Description   Final    URINE, CLEAN CATCH Performed at [facility], [address] [place], [place] [address]    Special Requests   Final    NONE Performed at [facility], [address] [place], [place] [address]    Culture (A)  Final    40,000 COLONIES/mL ESCHERICHIA COLI REPEATING SUSCEPTIBILITIES Performed at [facility], [address], [place], [place] [address]    Report Status PENDING  Incomplete  Culture, blood (routine x 2)     Status: None (Preliminary result)   Collection Time: 09/15/17  9:09 AM  Result Value Ref Range Status   Specimen Description BLOOD LEFT HAND  Final   Special Requests   Final    BOTTLES DRAWN AEROBIC AND ANAEROBIC Blood Culture adequate volume   Culture   Final    NO GROWTH 3 DAYS Performed at [facility], [address] [place], [place] [address]    Report Status PENDING  Incomplete  Culture, blood (routine x 2)     Status: Abnormal   Collection Time: 09/15/17  9:09 AM  Result Value Ref Range Status   Specimen Description   Final    BLOOD LEFT WRIST Performed at [facility], [address], [place],  [place] [address]    Special Requests   Final    BOTTLES DRAWN AEROBIC AND ANAEROBIC Blood Culture adequate volume Performed at [facility], [address], [place], [place] [address]    Culture  Setup Time   Final    GRAM NEGATIVE RODS Gram Stain Report Called to,Read Back By and Verified With: HOWARD C. AT 2329 ON [id number] BY THOMPSON S. CRITICAL RESULT CALLED TO, READ BACK BY AND VERIFIED WITH: C.HOWARD,RN [id number] 09/16/17 M.CAMPBELL    Culture (A)  Final    ESCHERICHIA COLI Confirmed Extended Spectrum Beta-Lactamase Producer (ESBL).  In bloodstream infections from ESBL organisms, carbapenems are preferred over piperacillin/tazobactam. They are shown to have a lower risk of mortality. Performed at [facility], [address], [place], [place] [address]    Report Status 09/18/2017 FINAL  Final   Organism ID, Bacteria ESCHERICHIA COLI  Final      Susceptibility   Escherichia coli - MIC*    AMPICILLIN >=32 RESISTANT Resistant     CEFAZOLIN >=64 RESISTANT Resistant     CEFEPIME >=64 RESISTANT Resistant     CEFTAZIDIME >=64 RESISTANT Resistant     CEFTRIAXONE >=64 RESISTANT Resistant     CIPROFLOXACIN >=4 RESISTANT Resistant     GENTAMICIN <=1 SENSITIVE Sensitive     IMIPENEM <=0.25 SENSITIVE Sensitive     TRIMETH/SULFA >=320 RESISTANT Resistant     AMPICILLIN/SULBACTAM >=32 RESISTANT Resistant     PIP/TAZO 64 INTERMEDIATE Intermediate     Extended ESBL POSITIVE Resistant     * ESCHERICHIA COLI  Blood Culture ID Panel (Reflexed)     Status: Abnormal   Collection Time: 09/15/17  9:09 AM  Result Value Ref Range Status   Enterococcus species NOT DETECTED NOT DETECTED Final   Listeria monocytogenes NOT DETECTED NOT DETECTED Final   Staphylococcus species NOT DETECTED NOT DETECTED Final   Staphylococcus aureus NOT DETECTED NOT DETECTED Final   Streptococcus species NOT DETECTED NOT DETECTED Final   Streptococcus agalactiae NOT DETECTED NOT DETECTED Final   Streptococcus pneumoniae NOT  DETECTED NOT DETECTED Final   Streptococcus pyogenes NOT DETECTED NOT DETECTED Final   Acinetobacter baumannii NOT DETECTED NOT DETECTED Final   Enterobacteriaceae species DETECTED (A) NOT DETECTED Final    Comment: Enterobacteriaceae represent a large family of gram-negative bacteria, not a single organism. CRITICAL RESULT CALLED TO, READ BACK BY AND VERIFIED WITH: C.HOWARD,RN [id number] 09/16/17 M.CAMPBELL    Enterobacter cloacae complex NOT DETECTED NOT DETECTED Final   Escherichia coli DETECTED (A) NOT DETECTED Final    Comment: CRITICAL RESULT CALLED TO, READ BACK BY AND VERIFIED WITH: C.HOWARD,RN [id number] 09/16/17 M.CAMPBELL    Klebsiella oxytoca NOT DETECTED NOT DETECTED Final   Klebsiella pneumoniae NOT DETECTED NOT DETECTED Final   Proteus species NOT DETECTED NOT DETECTED Final   Serratia marcescens NOT DETECTED NOT DETECTED Final   Carbapenem resistance NOT DETECTED NOT DETECTED Final   Haemophilus influenzae NOT DETECTED NOT DETECTED Final   Neisseria meningitidis NOT DETECTED NOT DETECTED Final   Pseudomonas aeruginosa NOT DETECTED NOT DETECTED Final   Candida albicans NOT DETECTED NOT DETECTED Final   Candida glabrata NOT DETECTED NOT DETECTED Final   Candida krusei NOT DETECTED NOT DETECTED Final   Candida parapsilosis NOT DETECTED NOT DETECTED Final   Candida tropicalis NOT DETECTED NOT DETECTED Final    Comment: Performed at [facility], [address] [place], [place] [address]     Labs: BNP (last 3 results) No results for input(s): BNP in the last 8760 hours. Basic Metabolic Panel: Recent Labs  Lab 09/14/17 0427 09/15/17 0435 09/16/17 0628 09/17/17 0502 09/18/17 0550  NA 138 137 139 136 135  K 3.3* 3.7 3.1* 3.2* 4.1  CL 106 104 100* 98* 100*  CO2 22 21* 29 26 24   GLUCOSE 161* 81 93 82 95  BUN 11 7 9 11 12   CREATININE 0.59 0.49 0.58 0.61 0.66  CALCIUM 7.5* 8.5* 8.6* 8.7* 8.6*  MG  --  1.7  --  1.9  --    Liver Function Tests: Recent Labs  Lab  09/13/17 1954  AST 19  ALT 16  ALKPHOS 55  BILITOT 0.8  PROT 6.4*  ALBUMIN 2.7*   Recent Labs  Lab 09/13/17 1954  LIPASE 18   No results for input(s): AMMONIA in the last 168 hours. CBC: Recent Labs  Lab 09/14/17 0427  09/15/17 0435 09/16/17 0628 09/17/17 0502 09/18/17 0550  WBC 11.9* 11.8* 8.4 7.7 7.9  NEUTROABS 9.2* 8.8* 6.4 5.3 4.8  HGB 10.0* 12.0 12.0 12.3 12.9  HCT 32.4* 38.3 37.2 37.6 39.9  MCV 99.4 99.0 95.9 95.4 95.2  PLT 151 145* 161 175 161   Cardiac Enzymes: No results for input(s): CKTOTAL, CKMB, CKMBINDEX, TROPONINI in the last 168 hours. BNP: Invalid input(s): POCBNP CBG: No results for input(s): GLUCAP in the last 168 hours. D-Dimer No results for input(s): DDIMER in the last 72 hours. Hgb A1c No results for input(s): HGBA1C in the last 72 hours. Lipid Profile No results for input(s): CHOL, HDL, LDLCALC, TRIG, CHOLHDL, LDLDIRECT in the last 72 hours. Thyroid function studies No results for input(s): TSH, T4TOTAL, T3FREE, THYROIDAB in the last 72 hours.  Invalid input(s): FREET3 Anemia work up No results for input(s): VITAMINB12, FOLATE, FERRITIN, TIBC, IRON, RETICCTPCT in the last 72 hours. Urinalysis    Component Value Date/Time   COLORURINE AMBER (A) 09/13/2017 1834   APPEARANCEUR CLOUDY (A) 09/13/2017 1834   LABSPEC 1.016 09/13/2017 1834   PHURINE 5.0 09/13/2017 1834   GLUCOSEU NEGATIVE 09/13/2017 1834   HGBUR LARGE (A) 09/13/2017 1834   BILIRUBINUR NEGATIVE 09/13/2017 1834   KETONESUR 5 (A) 09/13/2017 1834   PROTEINUR 30 (A) 09/13/2017 1834   NITRITE NEGATIVE 09/13/2017 1834   LEUKOCYTESUR MODERATE (A) 09/13/2017 1834   Sepsis Labs Invalid input(s): PROCALCITONIN,  WBC,  LACTICIDVEN Microbiology Recent Results (from the past 240 hour(s))  MRSA PCR Screening     Status: None   Collection Time: 09/14/17 12:45 AM  Result Value Ref Range Status   MRSA by PCR NEGATIVE NEGATIVE Final    Comment:        The GeneXpert MRSA Assay  (FDA approved for NASAL specimens only), is one component of a comprehensive MRSA colonization surveillance program. It is not intended to diagnose MRSA infection nor to guide or monitor treatment for MRSA infections. Performed at [facility], [address], [place], [place] [address]   Culture, Urine     Status: Abnormal (Preliminary result)   Collection Time: 09/15/17  8:15 AM  Result Value Ref Range Status   Specimen Description   Final    URINE, CLEAN CATCH Performed at [facility], [address] [place], [place] [address]    Special Requests   Final    NONE Performed at [facility], [address] [place], [place] [address]    Culture (A)  Final    40,000 COLONIES/mL ESCHERICHIA COLI REPEATING SUSCEPTIBILITIES Performed at [facility], [address], [place], [place] [address]    Report Status PENDING  Incomplete  Culture, blood (routine x 2)     Status: None (Preliminary result)   Collection Time: 09/15/17  9:09 AM  Result Value Ref Range Status   Specimen Description BLOOD LEFT HAND  Final   Special Requests   Final    BOTTLES DRAWN AEROBIC AND ANAEROBIC Blood Culture adequate volume   Culture   Final    NO GROWTH 3 DAYS Performed at [facility], [address] [place], [place] [address]    Report Status PENDING  Incomplete  Culture, blood (routine x 2)     Status: Abnormal   Collection Time: 09/15/17  9:09 AM  Result Value Ref Range Status   Specimen Description   Final    BLOOD LEFT WRIST Performed at [facility], [address] [place], [place] [address]  Special Requests   Final    BOTTLES DRAWN AEROBIC AND ANAEROBIC Blood Culture adequate volume Performed at [facility], [address], [place], [place] [address]    Culture  Setup Time   Final    GRAM NEGATIVE RODS Gram Stain Report Called to,Read Back By and Verified With: HOWARD C. AT 2329 ON [id number] BY THOMPSON S. CRITICAL RESULT CALLED TO, READ BACK BY AND VERIFIED WITH: C.HOWARD,RN  9811 09/16/17 M.CAMPBELL    Culture (A)  Final    ESCHERICHIA COLI Confirmed Extended Spectrum Beta-Lactamase Producer (ESBL).  In bloodstream infections from ESBL organisms, carbapenems are preferred over piperacillin/tazobactam. They are shown to have a lower risk of mortality. Performed at [facility], [address], [place], [place] [address]    Report Status 09/18/2017 FINAL  Final   Organism ID, Bacteria ESCHERICHIA COLI  Final      Susceptibility   Escherichia coli - MIC*    AMPICILLIN >=32 RESISTANT Resistant     CEFAZOLIN >=64 RESISTANT Resistant     CEFEPIME >=64 RESISTANT Resistant     CEFTAZIDIME >=64 RESISTANT Resistant     CEFTRIAXONE >=64 RESISTANT Resistant     CIPROFLOXACIN >=4 RESISTANT Resistant     GENTAMICIN <=1 SENSITIVE Sensitive     IMIPENEM <=0.25 SENSITIVE Sensitive     TRIMETH/SULFA >=320 RESISTANT Resistant     AMPICILLIN/SULBACTAM >=32 RESISTANT Resistant     PIP/TAZO 64 INTERMEDIATE Intermediate     Extended ESBL POSITIVE Resistant     * ESCHERICHIA COLI  Blood Culture ID Panel (Reflexed)     Status: Abnormal   Collection Time: 09/15/17  9:09 AM  Result Value Ref Range Status   Enterococcus species NOT DETECTED NOT DETECTED Final   Listeria monocytogenes NOT DETECTED NOT DETECTED Final   Staphylococcus species NOT DETECTED NOT DETECTED Final   Staphylococcus aureus NOT DETECTED NOT DETECTED Final   Streptococcus species NOT DETECTED NOT DETECTED Final   Streptococcus agalactiae NOT DETECTED NOT DETECTED Final   Streptococcus pneumoniae NOT DETECTED NOT DETECTED Final   Streptococcus pyogenes NOT DETECTED NOT DETECTED Final   Acinetobacter baumannii NOT DETECTED NOT DETECTED Final   Enterobacteriaceae species DETECTED (A) NOT DETECTED Final    Comment: Enterobacteriaceae represent a large family of gram-negative bacteria, not a single organism. CRITICAL RESULT CALLED TO, READ BACK BY AND VERIFIED WITH: C.HOWARD,RN 2956 09/16/17 M.CAMPBELL     Enterobacter cloacae complex NOT DETECTED NOT DETECTED Final   Escherichia coli DETECTED (A) NOT DETECTED Final    Comment: CRITICAL RESULT CALLED TO, READ BACK BY AND VERIFIED WITH: C.HOWARD,RN 2130 09/16/17 M.CAMPBELL    Klebsiella oxytoca NOT DETECTED NOT DETECTED Final   Klebsiella pneumoniae NOT DETECTED NOT DETECTED Final   Proteus species NOT DETECTED NOT DETECTED Final   Serratia marcescens NOT DETECTED NOT DETECTED Final   Carbapenem resistance NOT DETECTED NOT DETECTED Final   Haemophilus influenzae NOT DETECTED NOT DETECTED Final   Neisseria meningitidis NOT DETECTED NOT DETECTED Final   Pseudomonas aeruginosa NOT DETECTED NOT DETECTED Final   Candida albicans NOT DETECTED NOT DETECTED Final   Candida glabrata NOT DETECTED NOT DETECTED Final   Candida krusei NOT DETECTED NOT DETECTED Final   Candida parapsilosis NOT DETECTED NOT DETECTED Final   Candida tropicalis NOT DETECTED NOT DETECTED Final    Comment: Performed at [facility], [address] [place], [place] [address]     Time coordinating discharge: Over 30 minutes  SIGNED:   Erick Blinks,  DO [facility] 09/18/2017, 3:47 PM Pager [phone]  If 7PM-7AM, please contact night-coverage [email or web address] Password [email or web address]

## 2017-09-18 NOTE — Care Management Note (Signed)
Case Management Note  Patient Details  Name: Lisa [name] MRN: [id number] Date of Birth: Dec 12, 1955  If discussed at Long Length of Stay Meetings, dates discussed:  09/18/17  Additional Comments:  Malcolm Metrohildress, Natthew Marlatt Demske, RN 09/18/2017, 1:18 PM

## 2017-09-18 NOTE — Progress Notes (Signed)
Pt's IV catheter removed and intact. Pt's IV site clean dry and intact. Report called and given to [place] at [facility]. All questions were answered and no further questions at this time. Pt will be transported via [facility].

## 2017-09-18 NOTE — Progress Notes (Signed)
Pharmacy Antibiotic Note  Lisa [name] is a 62 y.o. female admitted on 09/13/2017 with ESBL E. Coli  bacteremia.  Pharmacy has been consulted for Merrem dosing. Patient with h/o seizures and on depakote. Due to drug interaction with Merrem, ID recommended adding Keppra along with depakote while on Merrem.  Plan: Merrem 1gm IV q8h F/U cxs and clinical progress Monitor V/S, labs  Height: 5\' 4"  (162.6 cm) Weight: 123 lb 7.3 oz (56 kg) IBW/kg (Calculated) : 54.7  Temp (24hrs), Avg:98.6 F (37 C), Min:98.4 F (36.9 C), Max:98.9 F (37.2 C)  Recent Labs  Lab 09/14/17 0427 09/15/17 0435 09/16/17 0628 09/17/17 0502 09/18/17 0550  WBC 11.9* 11.8* 8.4 7.7 7.9  CREATININE 0.59 0.49 0.58 0.61 0.66    Estimated Creatinine Clearance: 63.8 mL/min (by C-G formula based on SCr of 0.66 mg/dL).    No Known Allergies  Antimicrobials this admission: Merrem 3/19>> Vanco 3/16 >> 3/17 Zosyn 3/16 >> 3/19 Ceftriaxone 3/15 >>3/16   Microbiology results: 3/16 BCx: E. Coli s- Gentamicin, Imipenem R- Cefazol cefepime, cipro, septra              +ESBL 3/16 UCx: 40,000 E.Coli s-pending 3/15 MRSA PCR: negative  Thank you for allowing pharmacy to be a part of this patient's care.  Lisa [name], Lisa [name] 09/18/2017 12:48 PM

## 2017-09-18 NOTE — NC FL2 (Signed)
[place] MEDICAID FL2 LEVEL OF CARE SCREENING TOOL     IDENTIFICATION  Patient Name: Lisa [name] Birthdate: 08/16/1955 Sex: female Admission Date (Current Location): 09/13/2017  [facility] and [place] Number:  [employer] and Address:  [facility],  [address] [place] [address]      Provider Number: [id number]  Attending Physician Name and Address:  Erick Blinks, DO  Relative Name and Phone Number:       Current Level of Care: Hospital Recommended Level of Care: Skilled Nursing Facility Prior Approval Number:    Date Approved/Denied:   PASRR Number:    Discharge Plan: SNF    Current Diagnoses: Patient Active Problem List   Diagnosis Date Noted  . Left spastic hemiplegia (HCC) 09/14/2017  . Acute pyelonephritis 09/13/2017  . Hypokalemia 09/13/2017  . Anemia, unspecified 09/13/2017  . Hyperlipemia 09/13/2017  . 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 Hypertension 09/13/2017    Orientation RESPIRATION BLADDER Height & Weight     Self  Normal Incontinent Weight: 123 lb 7.3 oz (56 kg) Height:  5\' 4"  (162.6 cm)  BEHAVIORAL SYMPTOMS/MOOD NEUROLOGICAL BOWEL NUTRITION STATUS      Continent Diet(see dc summary)  AMBULATORY STATUS COMMUNICATION OF NEEDS Skin   Extensive Assist Verbally Normal                       Personal Care Assistance Level of Assistance  Bathing, Feeding, Dressing Bathing Assistance: Limited assistance Feeding assistance: Limited assistance Dressing Assistance: Limited assistance     Functional Limitations Info  Sight, Hearing, Speech Sight Info: Adequate Hearing Info: Adequate Speech Info: Adequate    SPECIAL CARE FACTORS FREQUENCY                       Contractures Contractures Info: Not present    Additional Factors Info  Code Status, Allergies Code Status Info: full Allergies Info: No Known Allergies           Current Medications  (09/18/2017):  This is the current hospital active medication list Current Facility-Administered Medications  Medication Dose Route Frequency Provider Last Rate Last Dose  . acetaminophen (TYLENOL) tablet 650 mg  650 mg Oral Q6H PRN Bobette Mo, MD   650 mg at 09/14/17 1825   Or  . acetaminophen (TYLENOL) suppository 650 mg  650 mg Rectal Q6H PRN Bobette Mo, MD      . aspirin EC tablet 81 mg  81 mg Oral Daily Bobette Mo, MD   81 mg at 09/18/17 0850  . calcium carbonate (TUMS - dosed in mg elemental calcium) chewable tablet 200 mg of elemental calcium  1 tablet Oral Daily Bobette Mo, MD   200 mg of elemental calcium at 09/18/17 0850  . divalproex (DEPAKOTE) DR tablet 750 mg  750 mg Oral Q12H Shah, Pratik D, DO   750 mg at 09/18/17 0850  . ondansetron (ZOFRAN) tablet 4 mg  4 mg Oral Q6H PRN Bobette Mo, MD       Or  . ondansetron [facility]) injection 4 mg  4 mg Intravenous Q6H PRN Bobette Mo, MD      . piperacillin-tazobactam (ZOSYN) IVPB 3.375 g  3.375 g Intravenous Q8H Shah, Pratik D, DO   Stopped at 09/18/17 0930  . tiZANidine (ZANAFLEX) tablet 2 mg  2 mg Oral BID Bobette Mo, MD   2 mg at 09/18/17 1024  . [  START ON 09/19/2017] Vitamin D (Ergocalciferol) (DRISDOL) capsule 50,000 Units  50,000 Units Oral Q7 days Bobette Mortiz, David Manuel, MD         Discharge Medications: Please see discharge summary for a list of discharge medications.  Relevant Imaging Results:  Relevant Lab Results:   Additional Information    Elliot GaultKathleen Erique Kaser, LCSW

## 2017-09-19 LAB — URINE CULTURE: Culture: 40000 — AB

## 2017-09-20 LAB — CULTURE, BLOOD (ROUTINE X 2)
Culture: NO GROWTH
Special Requests: ADEQUATE

## 2017-09-24 ENCOUNTER — Inpatient Hospital Stay (HOSPITAL_COMMUNITY)
Admission: EM | Admit: 2017-09-24 | Discharge: 2017-09-28 | DRG: 872 | Disposition: A | Discharge status: Skilled Nursing Facility | Payer: Medicaid Other | Source: Other Acute Inpatient Hospital | Attending: Internal Medicine | Admitting: Internal Medicine

## 2017-09-24 DIAGNOSIS — D509 Iron deficiency anemia, unspecified: Secondary | ICD-10-CM | POA: Diagnosis present

## 2017-09-24 DIAGNOSIS — M170 Bilateral primary osteoarthritis of knee: Secondary | ICD-10-CM | POA: Diagnosis present

## 2017-09-24 DIAGNOSIS — I10 Essential (primary) hypertension: Secondary | ICD-10-CM | POA: Diagnosis present

## 2017-09-24 DIAGNOSIS — E785 Hyperlipidemia, unspecified: Secondary | ICD-10-CM | POA: Diagnosis present

## 2017-09-24 DIAGNOSIS — F0150 Vascular dementia without behavioral disturbance: Secondary | ICD-10-CM | POA: Diagnosis present

## 2017-09-24 DIAGNOSIS — R7881 Bacteremia: Secondary | ICD-10-CM | POA: Diagnosis not present

## 2017-09-24 DIAGNOSIS — G40909 Epilepsy, unspecified, not intractable, without status epilepticus: Secondary | ICD-10-CM | POA: Diagnosis present

## 2017-09-24 DIAGNOSIS — F05 Delirium due to known physiological condition: Secondary | ICD-10-CM | POA: Diagnosis present

## 2017-09-24 DIAGNOSIS — I69154 Hemiplegia and hemiparesis following nontraumatic intracerebral hemorrhage affecting left non-dominant side: Secondary | ICD-10-CM | POA: Diagnosis not present

## 2017-09-24 DIAGNOSIS — A4151 Sepsis due to Escherichia coli [E. coli]: Principal | ICD-10-CM | POA: Diagnosis present

## 2017-09-24 DIAGNOSIS — Z7982 Long term (current) use of aspirin: Secondary | ICD-10-CM

## 2017-09-24 DIAGNOSIS — R1312 Dysphagia, oropharyngeal phase: Secondary | ICD-10-CM | POA: Diagnosis present

## 2017-09-24 DIAGNOSIS — A419 Sepsis, unspecified organism: Secondary | ICD-10-CM | POA: Diagnosis not present

## 2017-09-24 DIAGNOSIS — R6520 Severe sepsis without septic shock: Secondary | ICD-10-CM | POA: Diagnosis present

## 2017-09-24 DIAGNOSIS — N12 Tubulo-interstitial nephritis, not specified as acute or chronic: Secondary | ICD-10-CM | POA: Diagnosis present

## 2017-09-24 DIAGNOSIS — D649 Anemia, unspecified: Secondary | ICD-10-CM | POA: Diagnosis present

## 2017-09-24 DIAGNOSIS — A0472 Enterocolitis due to Clostridium difficile, not specified as recurrent: Secondary | ICD-10-CM | POA: Diagnosis present

## 2017-09-24 DIAGNOSIS — Z79899 Other long term (current) drug therapy: Secondary | ICD-10-CM

## 2017-09-24 DIAGNOSIS — I959 Hypotension, unspecified: Secondary | ICD-10-CM | POA: Diagnosis present

## 2017-09-24 DIAGNOSIS — I69191 Dysphagia following nontraumatic intracerebral hemorrhage: Secondary | ICD-10-CM

## 2017-09-24 DIAGNOSIS — N390 Urinary tract infection, site not specified: Secondary | ICD-10-CM | POA: Diagnosis not present

## 2017-09-24 DIAGNOSIS — E782 Mixed hyperlipidemia: Secondary | ICD-10-CM | POA: Diagnosis present

## 2017-09-24 DIAGNOSIS — E559 Vitamin D deficiency, unspecified: Secondary | ICD-10-CM | POA: Diagnosis present

## 2017-09-24 DIAGNOSIS — E876 Hypokalemia: Secondary | ICD-10-CM | POA: Diagnosis present

## 2017-09-24 DIAGNOSIS — I69120 Aphasia following nontraumatic intracerebral hemorrhage: Secondary | ICD-10-CM | POA: Diagnosis not present

## 2017-09-24 DIAGNOSIS — G8114 Spastic hemiplegia affecting left nondominant side: Secondary | ICD-10-CM | POA: Diagnosis not present

## 2017-09-24 DIAGNOSIS — B9620 Unspecified Escherichia coli [E. coli] as the cause of diseases classified elsewhere: Secondary | ICD-10-CM | POA: Diagnosis not present

## 2017-09-24 NOTE — H&P (Signed)
4      History and Physical    Lisa [name] OZH:[id number] DOB: Nov 26, 1955 DOA: 09/24/2017  PCP: Audree BaneKing, Peter, DO   Patient [facility].  I have personally briefly reviewed patient's [facility]  Chief Complaint: Hypotension.  HPI: Lisa [name] is a 62 y.o. 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 coli pyelonephritis and now is being transferred from [place] where she presented with hypotension and fever.    She denies earache, headache, rhinorrhea, sore throat, productive cough, wheezing, hemoptysis, chest pain, palpitations, dizziness, lower extremity edema, constipation, melena or hematochezia.  She denies flank pain, dysuria, frequency or hematuria. Denies heat or cold intolerance.  Denies polydipsia, polyuria or polyphagia.  ED Course: The patient received 5000 mL of normal saline bolus due to hypotension with a systolic in the 70s and 80s, meropenem and vancomycin in [place].  Urinalysis did not show any significant signs of infection.  Chest radiograph did not show infiltrate.  Her initial vital signs in this facility temperature 98 F, pulse 93, respirations 16 and blood pressure 153/70 mmHg with an O2 sat of 97% on room air.  Her white count was 15.1 with 84% neutrophils, 5% lymphocytes and 11% monocytes.  Hemoglobin was 10.8 g/dL and platelets [id number].  PT/INR and PTT were within normal range.  Sodium 140, potassium 2.8, chloride 113, CO2 18 and lactic acid 1.9 mmol/L.  Glucose 127, BUN 21, creatinine 0.74 and calcium 7.1 mg/dL.  Total protein was 5.0 and albumin  1.9 g/dL.  All other LFTs were normal.  Review of Systems: As per HPI otherwise 10 point review of systems negative.    Past Medical History:  Diagnosis Date  . Abnormal posture   . Adjustment disorder   . Anemia, unspecified   . Aphasia-angular gyrus syndrome   . Arthritis, climacteric, knee, left   . Atypical psychosis (HCC)   . Cerebral arteritis   . Contracture of left ankle   . Essential hypertension, malignant   . Essential hypertension, malignant   . Hyperlipemia   . Left spastic hemiplegia (HCC)   . Muscle weakness (generalized)   . 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 Oropharyngeal dysphagia   . Other sequelae following nontraumatic subarachnoid hemorrhage   . Primary osteoarthritis of both knees   . Screening for thyroid disorder   . Screening for thyroid disorder   . Sequelae of cerebral infarction   . Unsteady   . Vascular dementia with delirium     No past surgical history on file.   reports that she has never smoked. She has never used smokeless tobacco. She reports that she does not drink alcohol or use drugs.  No Known Allergies  Family History  Problem Relation Age of Onset  . Hypertension Other     Prior to Admission medications   Medication Sig Start Date End Date Taking? Authorizing Provider  acetaminophen (TYLENOL) 650 MG CR tablet Take 650 mg by mouth every 8 (eight) hours as needed for pain.    [provider]  aspirin EC 81 MG tablet Take 81 mg by mouth daily.    [provider]  calcium carbonate (TUMS - DOSED  IN MG ELEMENTAL CALCIUM) 500 MG chewable tablet Chew 1 tablet by mouth daily.    [provider]  divalproex (DEPAKOTE) 250 MG DR tablet Take 250 mg by mouth 2 (two) times daily.    [provider]  divalproex (DEPAKOTE) 500 MG DR tablet Take 500 mg by mouth 2 (two) times daily.    [provider]  levETIRAcetam (KEPPRA) 500 MG tablet Take 1 tablet (500 mg  total) by mouth 2 (two) times daily for 7 days. 09/18/17 09/25/17  Sherryll Burger, Pratik D, DO  lisinopril (PRINIVIL,ZESTRIL) 10 MG tablet Take 10 mg by mouth daily.    [provider]  meropenem 1 g in sodium chloride 0.9 % 100 mL Inject 1 g into the vein every 8 (eight) hours for 21 doses. 09/18/17 09/25/17  Sherryll Burger, Pratik D, DO  tizanidine (ZANAFLEX) 2 MG capsule Take 2 mg by mouth 2 (two) times daily.    [provider]  Vitamin D, Ergocalciferol, (DRISDOL) 50000 units CAPS capsule Take 50,000 Units by mouth every 7 (seven) days.    [provider]    Physical Exam: Vitals:   09/24/17 2333  BP: (!) 153/70  Pulse: 93  Resp: 16  Temp: 98 F (36.7 C)  TempSrc: Oral  SpO2: 97%  Weight: 61 kg (134 lb 7.7 oz)  Height: 5\' 4"  (1.626 m)    Constitutional: NAD, calm, comfortable Eyes: PERRL, lids and conjunctivae normal ENMT: Mucous membranes are moist. Posterior pharynx clear of any exudate or lesions. Neck: normal, supple, no masses, no thyromegaly Respiratory: clear to auscultation bilaterally, no wheezing, no crackles. Normal respiratory effort. No accessory muscle use.  Cardiovascular: Regular rate and rhythm, no murmurs / rubs / gallops. No extremity edema. 2+ pedal pulses. No carotid bruits.  Abdomen: Nondistended, soft, mild diffuse tenderness, no guarding/rebound/masses palpated. No hepatosplenomegaly. Bowel sounds positive.  Musculoskeletal: no clubbing / cyanosis. Good ROM, no contractures.  Abnormal left-sided extremities muscle tone.  Skin: no rashes, lesions, ulcers on limited dermatological examination Neurologic: CN 2-12 grossly intact. Left-sided spastic hemiparesis with 2/5 strength. Psychiatric: Normal judgment and insight. Alert and oriented x 4. Normal mood.    Labs on Admission: I have personally reviewed following labs and imaging studies  CBC: Recent Labs  Lab 09/18/17 0550  WBC 7.9  NEUTROABS 4.8  HGB 12.9  HCT 39.9  MCV 95.2  PLT 161    Basic Metabolic Panel: Recent Labs  Lab 09/18/17 0550  NA 135  K 4.1  CL 100*  CO2 24  GLUCOSE 95  BUN 12  CREATININE 0.66  CALCIUM 8.6*   GFR: Estimated Creatinine Clearance: 63.8 mL/min (by C-G formula based on SCr of 0.66 mg/dL). Liver Function Tests: No results for input(s): AST, ALT, ALKPHOS, BILITOT, PROT, ALBUMIN in the last 168 hours. No results for input(s): LIPASE, AMYLASE in the last 168 hours. No results for input(s): AMMONIA in the last 168 hours. Coagulation Profile: No results for input(s): INR, PROTIME in the last 168 hours. Cardiac Enzymes: No results for input(s): CKTOTAL, CKMB, CKMBINDEX, TROPONINI in the last 168 hours. BNP (last 3 results) No results for input(s): PROBNP in the last 8760 hours. HbA1C: No results for input(s): HGBA1C in the last 72 hours. CBG: No results for input(s): GLUCAP in the last 168 hours. Lipid Profile: No results for input(s): CHOL, HDL, LDLCALC, TRIG, CHOLHDL, LDLDIRECT in the last 72 hours. Thyroid Function Tests: No results for input(s): TSH, T4TOTAL, FREET4, T3FREE, THYROIDAB in the last 72 hours. Anemia Panel:  No results for input(s): VITAMINB12, FOLATE, FERRITIN, TIBC, IRON, RETICCTPCT in the last 72 hours. Urine analysis:    Component Value Date/Time   COLORURINE AMBER (A) 09/13/2017 1834   APPEARANCEUR CLOUDY (A) 09/13/2017 1834   LABSPEC 1.016 09/13/2017 1834   PHURINE 5.0 09/13/2017 1834   GLUCOSEU NEGATIVE 09/13/2017 1834   HGBUR LARGE (A) 09/13/2017 1834   BILIRUBINUR NEGATIVE 09/13/2017 1834   KETONESUR 5 (A) 09/13/2017 1834   PROTEINUR 30 (A) 09/13/2017 1834   NITRITE NEGATIVE 09/13/2017 1834   LEUKOCYTESUR MODERATE (A) 09/13/2017 1834    Radiological Exams on Admission: No results found.  EKG: Independently reviewed.  Assessment/Plan Principal Problem:   Sepsis due to undetermined organism (HCC) I suspect this is due to C. difficile and not pyelonephritis. Admit to  telemetry/inpatient. Continue IV fluids. Monitor intake and output. Continue meropenem for pyelonephritis. Continue metronidazole 500 mg IVPB every 8 hours. Add vancomycin 125 mg p.o. 4 times a day. Keep contact precautions.  Active Problems:   C. difficile colitis Continue metronidazole. Start oral vancomycin. Strict contact precautions.    Hypokalemia Replacing. Follow potassium level.    Anemia, unspecified Secondary to iron deficiency. Monitor hematocrit and hemoglobin.    Hyperlipemia Not on medical therapy.    Hypertension Hold lisinopril due to earlier hypotension. Monitor blood pressure, renal function and electrolytes. Resume lisinopril once appropriate.    Left spastic hemiplegia (HCC) Supportive care. Continue muscle relaxants.    DVT prophylaxis: SCDs. Code Status: Full code. Family Communication:  Disposition Plan: Admit for IV antibiotics, oral vancomycin and further workup. Consults called:  Admission status: Inpatient/telemetry.   Bobette Mo MD [facility] Pager [phone].  If 7PM-7AM, please contact night-coverage [email or web address] Password [facility]  09/24/2017, 11:55 PM

## 2017-09-25 ENCOUNTER — Other Ambulatory Visit: Payer: Self-pay

## 2017-09-25 ENCOUNTER — Encounter (HOSPITAL_COMMUNITY): Payer: Self-pay

## 2017-09-25 DIAGNOSIS — N390 Urinary tract infection, site not specified: Secondary | ICD-10-CM

## 2017-09-25 DIAGNOSIS — B9620 Unspecified Escherichia coli [E. coli] as the cause of diseases classified elsewhere: Secondary | ICD-10-CM

## 2017-09-25 DIAGNOSIS — A0472 Enterocolitis due to Clostridium difficile, not specified as recurrent: Secondary | ICD-10-CM | POA: Diagnosis present

## 2017-09-25 LAB — CBC WITH DIFFERENTIAL/PLATELET
BASOS ABS: 0 10*3/uL (ref 0.0–0.1)
BASOS PCT: 0 %
EOS ABS: 0 10*3/uL (ref 0.0–0.7)
Eosinophils Relative: 0 %
HEMATOCRIT: 33.6 % — AB (ref 36.0–46.0)
Hemoglobin: 10.8 g/dL — ABNORMAL LOW (ref 12.0–15.0)
Lymphocytes Relative: 5 %
Lymphs Abs: 0.8 10*3/uL (ref 0.7–4.0)
MCH: 31.6 pg (ref 26.0–34.0)
MCHC: 32.1 g/dL (ref 30.0–36.0)
MCV: 98.2 fL (ref 78.0–100.0)
MONO ABS: 1.7 10*3/uL — AB (ref 0.1–1.0)
MONOS PCT: 11 %
NEUTROS ABS: 12.5 10*3/uL (ref 1.7–7.7)
Neutrophils Relative %: 84 %
Platelets: 296 10*3/uL (ref 150–400)
RBC: 3.42 MIL/uL — ABNORMAL LOW (ref 3.87–5.11)
RDW: 14.1 % (ref 11.5–15.5)
WBC: 15.1 10*3/uL — ABNORMAL HIGH (ref 4.0–10.5)

## 2017-09-25 LAB — PROTIME-INR
INR: 1.19
PROTHROMBIN TIME: 15 s (ref 11.4–15.2)

## 2017-09-25 LAB — C DIFFICILE QUICK SCREEN W PCR REFLEX
C DIFFICILE (CDIFF) TOXIN: NEGATIVE
C DIFFICLE (CDIFF) ANTIGEN: POSITIVE — AB

## 2017-09-25 LAB — COMPREHENSIVE METABOLIC PANEL
ALBUMIN: 1.9 g/dL — AB (ref 3.5–5.0)
ALT: 12 U/L — ABNORMAL LOW (ref 14–54)
ANION GAP: 9 (ref 5–15)
AST: 19 U/L (ref 15–41)
Alkaline Phosphatase: 50 U/L (ref 38–126)
BUN: 21 mg/dL — AB (ref 6–20)
CO2: 18 mmol/L — ABNORMAL LOW (ref 22–32)
Calcium: 7.1 mg/dL — ABNORMAL LOW (ref 8.9–10.3)
Chloride: 113 mmol/L — ABNORMAL HIGH (ref 101–111)
Creatinine, Ser: 0.74 mg/dL (ref 0.44–1.00)
GFR calc Af Amer: >60 mL/min (ref >60)
GFR calc non Af Amer: >60 mL/min (ref >60)
GLUCOSE: 127 mg/dL — AB (ref 65–99)
POTASSIUM: 2.8 mmol/L — AB (ref 3.5–5.1)
Sodium: 140 mmol/L (ref 135–145)
TOTAL PROTEIN: 5 g/dL — AB (ref 6.5–8.1)
Total Bilirubin: 0.6 mg/dL (ref 0.3–1.2)

## 2017-09-25 LAB — MAGNESIUM: Magnesium: 1.6 mg/dL — ABNORMAL LOW (ref 1.7–2.4)

## 2017-09-25 LAB — CLOSTRIDIUM DIFFICILE BY PCR, REFLEXED: Toxigenic C. Difficile by PCR: POSITIVE — AB

## 2017-09-25 LAB — APTT: APTT: 35 s (ref 24–36)

## 2017-09-25 LAB — LACTIC ACID, PLASMA
Lactic Acid, Venous: 1.9 mmol/L (ref 0.5–1.9)
Lactic Acid, Venous: 1.3 mmol/L (ref 0.5–1.9)

## 2017-09-25 MED ORDER — POTASSIUM CHLORIDE 10 MEQ/100ML IV SOLN
10.0000 meq | INTRAVENOUS | Status: AC
  Administered 2017-09-25 (×4): 10 meq via INTRAVENOUS
  Filled 2017-09-25 (×4): qty 100

## 2017-09-25 MED ORDER — LEVETIRACETAM 500 MG PO TABS
500.0000 mg | ORAL_TABLET | Freq: Two times a day (BID) | ORAL | Status: DC
  Administered 2017-09-25 – 2017-09-28 (×8): 500 mg via ORAL
  Filled 2017-09-25 (×8): qty 1

## 2017-09-25 MED ORDER — DIVALPROEX SODIUM 250 MG PO DR TAB
250.0000 mg | DELAYED_RELEASE_TABLET | Freq: Two times a day (BID) | ORAL | Status: DC
  Administered 2017-09-25 – 2017-09-28 (×8): 250 mg via ORAL
  Filled 2017-09-25 (×8): qty 1

## 2017-09-25 MED ORDER — METRONIDAZOLE IN NACL 5-0.79 MG/ML-% IV SOLN
500.0000 mg | Freq: Three times a day (TID) | INTRAVENOUS | Status: AC
  Administered 2017-09-25 – 2017-09-28 (×11): 500 mg via INTRAVENOUS
  Filled 2017-09-25 (×11): qty 100

## 2017-09-25 MED ORDER — POTASSIUM CHLORIDE IN NACL 40-0.9 MEQ/L-% IV SOLN
INTRAVENOUS | Status: AC
  Administered 2017-09-25 (×2): 100 mL/h via INTRAVENOUS

## 2017-09-25 MED ORDER — ONDANSETRON HCL 4 MG PO TABS: 4.0000 mg | ORAL_TABLET | Freq: Four times a day (QID) | ORAL | Status: DC | PRN

## 2017-09-25 MED ORDER — SODIUM CHLORIDE 0.9 % IV SOLN
1.0000 g | Freq: Three times a day (TID) | INTRAVENOUS | Status: DC
  Administered 2017-09-25 – 2017-09-28 (×11): 1 g via INTRAVENOUS
  Filled 2017-09-25 (×13): qty 1

## 2017-09-25 MED ORDER — DIVALPROEX SODIUM 250 MG PO DR TAB
500.0000 mg | DELAYED_RELEASE_TABLET | Freq: Two times a day (BID) | ORAL | Status: DC
  Administered 2017-09-25 – 2017-09-28 (×8): 500 mg via ORAL
  Filled 2017-09-25 (×8): qty 2

## 2017-09-25 MED ORDER — ENSURE ENLIVE PO LIQD
237.0000 mL | Freq: Two times a day (BID) | ORAL | Status: DC
  Administered 2017-09-25 – 2017-09-28 (×6): 237 mL via ORAL

## 2017-09-25 MED ORDER — ASPIRIN EC 81 MG PO TBEC
81.0000 mg | DELAYED_RELEASE_TABLET | Freq: Every day | ORAL | Status: DC
  Administered 2017-09-25 – 2017-09-28 (×4): 81 mg via ORAL
  Filled 2017-09-25 (×4): qty 1

## 2017-09-25 MED ORDER — POTASSIUM CHLORIDE CRYS ER 20 MEQ PO TBCR
40.0000 meq | EXTENDED_RELEASE_TABLET | ORAL | Status: AC
  Administered 2017-09-25 – 2017-09-26 (×2): 40 meq via ORAL
  Filled 2017-09-25 (×2): qty 2

## 2017-09-25 MED ORDER — TIZANIDINE HCL 2 MG PO TABS
2.0000 mg | ORAL_TABLET | Freq: Two times a day (BID) | ORAL | Status: DC
  Administered 2017-09-25 – 2017-09-28 (×8): 2 mg via ORAL
  Filled 2017-09-25 (×14): qty 1

## 2017-09-25 MED ORDER — VANCOMYCIN 50 MG/ML ORAL SOLUTION
125.0000 mg | Freq: Four times a day (QID) | ORAL | Status: DC
  Administered 2017-09-25 – 2017-09-28 (×13): 125 mg via ORAL
  Filled 2017-09-25 (×28): qty 2.5

## 2017-09-25 MED ORDER — VITAMIN D (ERGOCALCIFEROL) 1.25 MG (50000 UNIT) PO CAPS
50000.0000 [IU] | ORAL_CAPSULE | ORAL | Status: DC
  Administered 2017-09-26: 50000 [IU] via ORAL
  Filled 2017-09-25 (×2): qty 1

## 2017-09-25 MED ORDER — ONDANSETRON HCL 4 MG/2ML IJ SOLN
4.0000 mg | Freq: Four times a day (QID) | INTRAMUSCULAR | Status: DC | PRN
  Administered 2017-09-26: 4 mg via INTRAVENOUS
  Filled 2017-09-25: qty 2

## 2017-09-25 MED ORDER — ACETAMINOPHEN 325 MG PO TABS
650.0000 mg | ORAL_TABLET | Freq: Three times a day (TID) | ORAL | Status: DC | PRN
  Administered 2017-09-25: 650 mg via ORAL
  Filled 2017-09-25: qty 2

## 2017-09-25 NOTE — Progress Notes (Signed)
[facility] PROGRESS NOTE  Lisa [name] ZOX:[id number] DOB: 02-23-1956 DOA: 09/24/2017 PCP: Audree Bane, DO   Interim summary and HPI 62 y.o. 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 coli pyelonephritis and now is being transferred from [place], [place] where she presented with hypotension and fever. Patient reported multiple episodes of diarrhea since discharge and expressed poor oral intake.   At presentation to [facility] [name] in the 80's, temp 101.3 and WBC's 15,000. No other acute complaints. Patient received 5L fluid resuscitation and meropenem and Vancomycin.  Assessment/Plan: 1-Severe sepsis: with concerns for C. Diff colitis as cause. Patient with recent E. Coli bacteremia and UTI. -patient would complete 2 more days as previously recommended by ID for ESBL UTI  And bacteremia -follow cultures (blood, urine and stool) -continue empiric treatment with oral vancomycin and IV flagyl -continue IVF's and supportive care  2-hypokalemia -replete electrolytes and follow trend -due to GI loses -check Mg level  3-HTN -VS stable -will hold home antihypertensive regimen for now -resume therapy when appropriate  4-hx of stroke: with residual hemiplegia  -continue risk factor modification -continue supportive care -continue PRN muscle relaxant   5-hx of Vit D deficiency  -continue Vit D supplementation   6-hx of seizure  -continue depakote and keppra (last one while on meropenem) -seizure precaution ordered   Code Status: Full Family Communication: no family at bedside  Disposition Plan:  continue 2 more days of Meropenem to complete treatment as previously indicated; follow C diff stool cx and continue empiric treatment with oral vancomycin and IV flagyl.   Consultants:  None   Procedures:  See below for x-ray reports   Antibiotics:  Meropenem (planning 2 more days; last day 3/27)  Oral vancomycin 3/25  IV flagyl 3/25  HPI/Subjective: AAOX3, currently afebrile, deniying CP and SOB. Reports still having severe explosive diarrhea.  Objective: Vitals:   09/25/17 0536 09/25/17 1500  BP: 110/72 139/68  Pulse: 68 80  Resp: 16 18  Temp: 98.4 F (36.9 C) 98.5 F (36.9 C)  SpO2: 100% 96%    Intake/Output Summary (Last 24 hours) at 09/25/2017 1740 Last data filed at 09/25/2017 1555 Gross per 24 hour  Intake 2665 ml  Output -  Net 2665 ml   Filed Weights   09/24/17 2333  Weight: 61 kg (134 lb 7.7 oz)    Exam:   General:  Afebrile currently, in not acute distress, denies CP and SOB. Having diarrhea.   Cardiovascular: S1 and S2, no rubs, no gallops  Respiratory: good air movement, no wheezing, no crackles.  Abdomen: soft, NT, ND, positive BS  Musculoskeletal: increase spasticity and muscle tune on the left, no cyanosis, no clubbing  Data Reviewed: Basic Metabolic Panel: Recent Labs  Lab 09/25/17 0107 09/25/17 0433  NA 140  --   K 2.8*  --   CL 113*  --   CO2 18*  --   GLUCOSE 127*  --   BUN 21*  --   CREATININE 0.74  --   CALCIUM 7.1*  --   MG  --  1.6*   Liver Function Tests: Recent Labs  Lab 09/25/17 0107  AST 19  ALT 12*  ALKPHOS 50  BILITOT 0.6  PROT 5.0*  ALBUMIN 1.9*   No results for input(s): LIPASE, AMYLASE in the last 168 hours. No results for input(s): AMMONIA in the last 168 hours. CBC: Recent Labs  Lab 09/25/17 0107  WBC 15.1*  NEUTROABS 12.5  HGB 10.8*  HCT 33.6*  MCV 98.2  PLT 296   Cardiac Enzymes: No results for input(s): CKTOTAL, CKMB, CKMBINDEX, TROPONINI in the last 168 hours. BNP (last 3  results) No results for input(s): BNP in the last 8760 hours.  ProBNP (last 3 results) No results for input(s): PROBNP in the last 8760 hours.  CBG: No results for input(s): GLUCAP in the last 168 hours.  No results found for this or any previous visit (from the past 240 hour(s)).   Studies: No results found.  Scheduled Meds: . aspirin EC  81 mg Oral Daily  . divalproex  250 mg Oral BID  . divalproex  500 mg Oral BID  . feeding supplement (ENSURE ENLIVE)  237 mL Oral BID BM  . levETIRAcetam  500 mg Oral BID  . tiZANidine  2 mg Oral BID  . vancomycin  125 mg Oral QID  . [START ON 09/26/2017] Vitamin D (Ergocalciferol)  50,000 Units Oral Q7 days   Continuous Infusions: . 0.9 % NaCl with KCl 40 mEq / L 100 mL/hr (09/25/17 1554)  . meropenem (MERREM) 1 GM IVPB (Mini-Bag Plus) Stopped (09/25/17 1624)  . metronidazole Stopped (09/25/17 1655)    Principal Problem:   Sepsis due to undetermined organism [facility](HCC) Active Problems:   Hypokalemia   Anemia, unspecified   Hyperlipemia   Hypertension   Left spastic hemiplegia (HCC)   C. difficile colitis    Time spent: 30 minutes    Vassie Lollarlos Brinden Kincheloe  [facility] Pager [phone]. If 7PM-7AM, please contact night-coverage at [email or web address], password [email or web address] 09/25/2017, 5:40 PM  LOS: 1 day

## 2017-09-25 NOTE — Progress Notes (Signed)
Initial Nutrition Assessment  DOCUMENTATION CODES:  Not applicable  INTERVENTION:  Continue Ensure Enlive po BID, each supplement provides 350 kcal and 20 grams of protein  Took extensive list of food/meal requests and preferences to promote po intake. Entered these is Health Touch & related to dietary ambassador  NUTRITION DIAGNOSIS:  Inadequate oral intake related to poor appetite, acute illness as evidenced by per patient/family report.  GOAL:  Patient will meet greater than or equal to 90% of their needs  MONITOR:  PO intake, Supplement acceptance, Weight trends, Labs, I & O's  REASON FOR ASSESSMENT:  Malnutrition Screening Tool    ASSESSMENT:  62 y/o female PMHx CVA, adjustment disorder, anemia, aphasia-angular gyrus syndrome, atypical psychosis, HTN/HLD, L spastic hemiplegia, oropharyngeal dysphasia, vascular dementia w/ delirium. Recently admitted 3/14-3/19 for acute R-sided pyelonephritis w/ E. Coli bacteremia. Transferred from OSH to which pt presented w/ hypotension & fever. Worked up for sepsis thought r/t cdiff and admitted for management  Patient seen alone at lunch time. She had eaten approximately 10-15% of her tray. She is a relatively poor historian.   RD asked why she did not eat much. She reports having "no appetite" since she was discharged 3/19. [facility] though unsure if she was referring to past admission or current admission. Intake records from last admission show fair intake, with majority of meal intakes being 30-75%. She says she did not eat anything at the OSH. Unknown how long she was there prior to transferring  She denies any n/v/c. She has diarrhea, which she says began yesterday. She also has stomach pain which was a factor in her poor lunch intake.   To facilitate improved meal intake/nutritional status, RD spent majority of encounter receiving food preferences and meal requests. She was also agreeable to supplements. RD  placed requests in health touch and relayed to diet ambassador  Regarding her weight, she says she thinks she may have lost "a little bit of weight". She does not know her UBW. Only objective wt history from chart is from last admission. It shows gain over last 10 days, though this is likely due to third spacing as opposed to true wt gain. Will use dry wt from 3/15 as dosing wt  Physical Exam: Displayed moderate muscle wasting of temporalis and mild muscle wasting of interosseous. Has mild orbital fat wasting. Suspect more areas to show wasting once edema subsides  Labs: Albumin: 1.9, WBC: 15.1, K:2.8, Mag:1.6 Meds: Ensure (no intake yet), IV abx,  IVF  Recent Labs  Lab 09/25/17 0107 09/25/17 0433  NA 140  --   K 2.8*  --   CL 113*  --   CO2 18*  --   BUN 21*  --   CREATININE 0.74  --   CALCIUM 7.1*  --   MG  --  1.6*  GLUCOSE 127*  --    NUTRITION - FOCUSED PHYSICAL EXAM:   Most Recent Value  Orbital Region  Mild depletion  Upper Arm Region  No depletion  Thoracic and Lumbar Region  Unable to assess  Buccal Region  No depletion  Temple Region  Moderate depletion  Clavicle Bone Region  No depletion  Clavicle and Acromion Bone Region  No depletion  Scapular Bone Region  Unable to assess  Dorsal Hand  Mild depletion  Patellar Region  No depletion  Anterior Thigh Region  No depletion  Posterior Calf Region  No depletion  Edema (RD Assessment)  Moderate  Diet Order:  Diet Heart Room service appropriate? Yes; Fluid consistency: Thin  EDUCATION NEEDS:  No education needs have been identified at this time  Skin:  Skin Assessment: Reviewed RN Assessment  Last BM:  3/26  Height:  Ht Readings from Last 1 Encounters:  09/24/17 5\' 4"  (1.626 m)   Weight:  Wt Readings from Last 1 Encounters:  09/24/17 134 lb 7.7 oz (61 kg)   Wt Readings from Last 10 Encounters:  09/24/17 134 lb 7.7 oz (61 kg)  09/15/17 123 lb 7.3 oz (56 kg)   Ideal Body Weight:  54.54 kg  BMI:   Body mass index is 23.08 kg/m.  Estimated Nutritional Needs:  Kcal:  1650-1850 kcals (30-34 kcal/kg dosing bw) Protein:  71-82 (1.3-1.5 g/kg dosing bw) Fluid:  >1.7 L fluid (30 ml/kg dosing wt) + enough to replace stool losses  Christophe Louis RD, LDN, CNSC Clinical Nutrition Available Tues-Sat via Pager: [phone] 09/25/2017 2:04 PM

## 2017-09-26 DIAGNOSIS — A0472 Enterocolitis due to Clostridium difficile, not specified as recurrent: Secondary | ICD-10-CM

## 2017-09-26 DIAGNOSIS — E876 Hypokalemia: Secondary | ICD-10-CM

## 2017-09-26 DIAGNOSIS — A4151 Sepsis due to Escherichia coli [E. coli]: Principal | ICD-10-CM

## 2017-09-26 DIAGNOSIS — I10 Essential (primary) hypertension: Secondary | ICD-10-CM

## 2017-09-26 DIAGNOSIS — R7881 Bacteremia: Secondary | ICD-10-CM

## 2017-09-26 DIAGNOSIS — G8114 Spastic hemiplegia affecting left nondominant side: Secondary | ICD-10-CM

## 2017-09-26 LAB — BASIC METABOLIC PANEL
Anion gap: 7 (ref 5–15)
BUN: 8 mg/dL (ref 6–20)
CHLORIDE: 111 mmol/L (ref 101–111)
CO2: 19 mmol/L — AB (ref 22–32)
Calcium: 8 mg/dL — ABNORMAL LOW (ref 8.9–10.3)
Creatinine, Ser: 0.5 mg/dL (ref 0.44–1.00)
GFR calc Af Amer: >60 mL/min (ref >60)
GFR calc non Af Amer: >60 mL/min (ref >60)
GLUCOSE: 96 mg/dL (ref 65–99)
POTASSIUM: 4.8 mmol/L (ref 3.5–5.1)
Sodium: 137 mmol/L (ref 135–145)

## 2017-09-26 LAB — MAGNESIUM: Magnesium: 1.7 mg/dL (ref 1.7–2.4)

## 2017-09-26 MED ORDER — MAGNESIUM SULFATE 2 GM/50ML IV SOLN
2.0000 g | Freq: Once | INTRAVENOUS | Status: AC
  Administered 2017-09-26: 2 g via INTRAVENOUS
  Filled 2017-09-26: qty 50

## 2017-09-26 NOTE — Progress Notes (Signed)
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 coli pyelonephritis and now is being transferred from [place], [place] where she presented with hypotension and fever. Patient reported abdominal pain associated with multiple episodes of diarrhea since discharge and expressed poor oral intake.   At presentation to [facility] [name] in the 80's, temp 101.3 and WBC's 15,000. No other acute complaints. Patient received 5L fluid resuscitation and meropenem and Vancomycin.  Chest x-ray was negative for any infiltrate.       Assessment/Plan: Sepsis -Secondary to C. difficile colitis -Blood pressure improved with fluid resuscitation -C. difficile PCR positive in a patient with clinical syndrome consistent with C. difficile colitis -Continue IV fluids  ESBL E. coli bacteremia -Source with urine -09/15/2017 blood cultures E. Coli -Continue antibiotics through 09/27/2017  C. difficile colitis -Continue oral vancomycin and IV metronidazole -am CBC  Seizure disorder -Continue Keppra through the end of therapy with meropenem -Continue Depakote  Essential hypertension -Holding lisinopril in the setting of sepsis -Blood pressure remained stable  Right hemiplegia--residual effect of stroke -PT evaluation -Continue Zanaflex -Continue aspirin  Hypokalemia -Repleted -mag 1.7   Disposition Plan:   SNF on 09/28/17 Family Communication:  No Family at bedside  Consultants:  none  Code  Status:  FULL  DVT Prophylaxis:  SCDs   Procedures: As Listed in Progress Note Above  Antibiotics: merrem 3/19>>> Po vanco 3/26>>> IV flagyl 3/26>>>    Subjective: Patient overall still feels weak.  She does feel stronger.  She denies any headache, chest pain, shortness breath, nausea, vomiting, fevers, chills.  She still has abdominal cramping with diarrhea.  There is no hematochezia or melena.  Objective: Vitals:   09/25/17 2033 09/25/17 2256 09/26/17 0329 09/26/17 1414  BP: (!) 147/58  138/61 133/77  Pulse: 82  70 79  Resp: 18  16 18   Temp: 97.6 F (36.4 C)  98.2 F (36.8 C) 97.8 F (36.6 C)  TempSrc: Oral  Oral Oral  SpO2: 97% 97% 99% 100%  Weight:      Height:        Intake/Output Summary (Last 24 hours) at 09/26/2017 1732 Last data filed at 09/26/2017 0012 Gross per 24 hour  Intake -  Output 500 ml  Net -500 ml   Weight change:  Exam:   General:  Pt is alert, follows commands appropriately, not in acute distress  HEENT: No icterus, No thrush, No neck mass, [place]/AT  Cardiovascular: RRR, S1/S2, no rubs, no gallops  Respiratory: CTA bilaterally, no wheezing, no crackles, no rhonchi  Abdomen: Soft/+BS, mild diffuse tender, non distended, no guarding  Extremities: No edema, No lymphangitis, No petechiae, No rashes, no synovitis   Data Reviewed: I have personally reviewed following labs and imaging studies Basic Metabolic Panel: Recent Labs  Lab 09/25/17 0107 09/25/17 0433 09/26/17 0554  NA 140  --  137  K 2.8*  --  4.8  CL 113*  --  111  CO2 18*  --  19*  GLUCOSE 127*  --  96  BUN 21*  --  8  CREATININE 0.74  --  0.50  CALCIUM 7.1*  --  8.0*  MG  --  1.6* 1.7   Liver Function Tests: Recent Labs  Lab 09/25/17 0107  AST 19  ALT 12*  ALKPHOS 50  BILITOT 0.6  PROT 5.0*  ALBUMIN 1.9*   No results for input(s): LIPASE, AMYLASE in the last 168 hours. No results for input(s): AMMONIA in the last 168 hours. Coagulation Profile: Recent Labs   Lab 09/25/17 0107  INR 1.19   CBC: Recent Labs  Lab 09/25/17 0107  WBC 15.1*  NEUTROABS 12.5  HGB 10.8*  HCT 33.6*  MCV 98.2  PLT 296   Cardiac Enzymes: No results for input(s): CKTOTAL, CKMB, CKMBINDEX, TROPONINI in the last 168 hours. BNP: Invalid input(s): POCBNP CBG: No results for input(s): GLUCAP in the last 168 hours. HbA1C: No results for input(s): HGBA1C in the last 72 hours. Urine analysis:    Component Value Date/Time   COLORURINE AMBER (A) 09/13/2017 1834   APPEARANCEUR CLOUDY (A) 09/13/2017 1834   LABSPEC 1.016 09/13/2017 1834   PHURINE 5.0 09/13/2017 1834   GLUCOSEU NEGATIVE 09/13/2017 1834   HGBUR LARGE (A) 09/13/2017 1834   BILIRUBINUR NEGATIVE 09/13/2017 1834   KETONESUR 5 (A) 09/13/2017 1834   PROTEINUR 30 (A) 09/13/2017 1834   NITRITE NEGATIVE 09/13/2017 1834   LEUKOCYTESUR MODERATE (A) 09/13/2017 1834   Sepsis Labs: @LABRCNTIP (procalcitonin:4,lacticidven:4) ) Recent Results (from the past 240 hour(s))  C difficile quick scan w PCR reflex     Status: Abnormal   Collection Time: 09/25/17  2:48 PM  Result Value Ref Range Status   C Diff antigen POSITIVE (A) NEGATIVE Final   C Diff toxin NEGATIVE NEGATIVE Final   C Diff interpretation Results are indeterminate. See PCR results.  Final    Comment: Performed at [facility], [address] [place], [place] [address]  C. Diff by PCR, Reflexed     Status: Abnormal   Collection Time: 09/25/17  2:48 PM  Result Value Ref Range Status   Toxigenic C. Difficile by PCR POSITIVE (A) NEGATIVE Final    Comment: Positive for toxigenic C. difficile with little to no toxin production. Only treat if clinical presentation suggests symptomatic illness. Performed at [facility], [address] [place], [place] [address]      Scheduled Meds: . aspirin EC  81 mg Oral Daily  . divalproex  250 mg Oral BID  . divalproex  500 mg Oral BID  . feeding supplement (ENSURE ENLIVE)  237 mL Oral BID BM  .  levETIRAcetam  500 mg Oral BID  . tiZANidine  2 mg Oral BID  . vancomycin  125 mg Oral QID  . Vitamin D (Ergocalciferol)  50,000 Units Oral Q7 days   Continuous Infusions: . meropenem (MERREM) 1 GM IVPB (Mini-Bag Plus) Stopped (09/26/17 1230)  . metronidazole Stopped (09/26/17 1153)    Procedures/Studies: Dg Chest 2 View  Result Date: 09/13/2017 CLINICAL DATA:  Fever and vomiting. EXAM: CHEST - 2 VIEW COMPARISON:  No comparison studies available. FINDINGS: Bandlike opacity at the left base is likely atelectatic. Right lung clear. Cardiopericardial silhouette is at upper limits of normal for size. The visualized bony structures of the thorax are intact. IMPRESSION: Bandlike opacity at the left base is probably atelectasis although pneumonia not entirely excluded. Electronically Signed   By: Kennith CenterEric  Mansell M.D.   On: 09/13/2017 19:17   Ct Abdomen Pelvis W Contrast  Result Date: 09/13/2017 CLINICAL DATA:  Right upper quadrant pain EXAM: CT ABDOMEN AND  PELVIS WITH CONTRAST TECHNIQUE: Multidetector CT imaging of the abdomen and pelvis was performed using the standard protocol following bolus administration of intravenous contrast. CONTRAST:  ISOVUE-300 IOPAMIDOL (ISOVUE-300) INJECTION 61% COMPARISON:  None. FINDINGS: Lower chest: No basilar pulmonary nodules or pleural effusion. No apical pericardial effusion. Hepatobiliary: Normal hepatic contours and density. No visible biliary dilatation. Normal gallbladder. Pancreas: Normal parenchymal contours without ductal dilatation. No peripancreatic fluid collection. Spleen: Normal. Adrenals/Urinary Tract: --Adrenal glands: Normal. --Right kidney/ureter: There is hyperenhancement of the wall of the right renal pelvis, which is mildly dilated. No ureteral obstruction is identified. Calcification near the upper pole is more likely vascular. Mildly heterogeneous enhancement pattern of the renal parenchyma. --Left kidney/ureter: No hydronephrosis, perinephric  stranding or nephrolithiasis. No obstructing ureteral stones. --Urinary bladder: Normal appearance for the degree of distention. Stomach/Bowel: --Stomach/Duodenum: No hiatal hernia or other gastric abnormality. Normal duodenal course. --Small bowel: No dilatation or inflammation. --Colon: There is a medium-size stool ball in the rectum with moderate surrounding wall thickening. The remainder of the colon is normal. --Appendix: Normal. Vascular/Lymphatic: Atherosclerotic calcification is present within the non-aneurysmal abdominal aorta, without hemodynamically significant stenosis. The portal vein, splenic vein, superior mesenteric vein and IVC are patent. No abdominal or pelvic lymphadenopathy. Reproductive: Normal uterus and ovaries. Musculoskeletal. No bony spinal canal stenosis or focal osseous abnormality. Other: None. IMPRESSION: 1. Increased enhancement of the periphery of the right renal collecting system with mildly heterogeneous enhancement pattern of the renal parenchyma. This could indicate ascending urinary tract infection and/or pyelonephritis. Correlate with urinalysis results. 2. Mild wall thickening of the rectum with medium-size stool ball. This could indicate a mild or early distal colitis. 3.  Aortic Atherosclerosis (ICD10-I70.0). Electronically Signed   By: Deatra Robinson M.D.   On: 09/13/2017 22:12    Catarina Hartshorn, DO  [facility] Pager [phone]  If 7PM-7AM, please contact night-coverage [email or web address] Password [facility] 09/26/2017, 5:32 PM   LOS: 2 days

## 2017-09-27 DIAGNOSIS — A419 Sepsis, unspecified organism: Secondary | ICD-10-CM

## 2017-09-27 LAB — CBC
HCT: 36.3 % (ref 36.0–46.0)
Hemoglobin: 11.6 g/dL — ABNORMAL LOW (ref 12.0–15.0)
MCH: 30.5 pg (ref 26.0–34.0)
MCHC: 32 g/dL (ref 30.0–36.0)
MCV: 95.5 fL (ref 78.0–100.0)
PLATELETS: 267 10*3/uL (ref 150–400)
RBC: 3.8 MIL/uL — AB (ref 3.87–5.11)
RDW: 13.8 % (ref 11.5–15.5)
WBC: 9.2 10*3/uL (ref 4.0–10.5)

## 2017-09-27 LAB — BASIC METABOLIC PANEL
ANION GAP: 8 (ref 5–15)
BUN: 5 mg/dL — ABNORMAL LOW (ref 6–20)
CO2: 23 mmol/L (ref 22–32)
Calcium: 7.7 mg/dL — ABNORMAL LOW (ref 8.9–10.3)
Chloride: 103 mmol/L (ref 101–111)
Creatinine, Ser: 0.49 mg/dL (ref 0.44–1.00)
Glucose, Bld: 92 mg/dL (ref 65–99)
Potassium: 3.9 mmol/L (ref 3.5–5.1)
Sodium: 134 mmol/L — ABNORMAL LOW (ref 135–145)

## 2017-09-27 LAB — MAGNESIUM: MAGNESIUM: 2 mg/dL (ref 1.7–2.4)

## 2017-09-27 MED ORDER — SODIUM CHLORIDE 0.9 % IV SOLN
INTRAVENOUS | Status: AC
  Administered 2017-09-27: 18:00:00 via INTRAVENOUS

## 2017-09-27 NOTE — Progress Notes (Signed)
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 coli pyelonephritis and now is being transferred [place] where she presented with hypotensionand fever. Lisa [name] reported abdominal pain associated with multiple episodes of diarrhea since discharge and expressed poor oral intake.   At presentation to [facility] Lisa [name] [name] in the 80's, temp 101.3 and WBC's 15,000. No other acute complaints. Lisa [name] received 5L fluid resuscitation and meropenem and Vancomycin.  Chest x-ray was negative for any infiltrate.       Assessment/Plan: Sepsis -Secondary to C. difficile colitis -Blood pressure improved with fluid resuscitation -C. difficile PCR positive in a Lisa [name] with clinical syndrome consistent with C. difficile colitis -Continue IV fluids  ESBL E. coli bacteremia -Source with urine -09/15/2017 blood cultures E. Coli -Continue antibiotics through 09/28/2017 am which would be 10 days of tx  C. difficile colitis -Continue oral vancomycin and IV metronidazole -am CBC  Seizure disorder -Continue Keppra through the end of therapy with meropenem -Continue Depakote  Essential hypertension -Holding lisinopril in the setting of sepsis -Blood pressure remained stable  Right hemiplegia--residual effect of stroke -PT evaluation -Continue Zanaflex -Continue aspirin  Hypokalemia -Repleted -mag 2.0   Disposition Plan:   SNF on 09/28/17 Family Communication:  No Family  at bedside  Consultants:  none  Code Status:  FULL  DVT Prophylaxis:  SCDs   Procedures: As Listed in Progress Note Above  Antibiotics: merrem 3/19>>> Po vanco 3/26>>> IV flagyl 3/26>>>     Subjective: Lisa [name] is feeling better.  She states that her stools have slowed down.  She states her abdominal pain is improving.  She is tolerating her diet.  She denies any chest pain, shortness breath, coughing, hemoptysis, fevers, chills, headache.  Objective: Vitals:   09/26/17 1414 09/26/17 2113 09/27/17 0701 09/27/17 1258  BP: 133/77 128/68 122/64 108/66  Pulse: 79 81 78 99  Resp: 18 18 18 20   Temp: 97.8 F (36.6 C) 98 F (36.7 C) 98.2 F (36.8 C)   TempSrc: Oral Oral Oral   SpO2: 100% 100% 100% 90%  Weight:      Height:        Intake/Output Summary (Last 24 hours) at 09/27/2017 1732 Last data filed at 09/27/2017 1506 Gross per 24 hour  Intake 1000 ml  Output 2050 ml  Net -1050 ml   Weight change:  Exam:   General:  Pt is alert, follows commands appropriately, not in acute distress  HEENT: No icterus, No thrush, No neck mass, [place]/AT  Cardiovascular: RRR, S1/S2, no rubs, no gallops  Respiratory: Bibasilar rales.  No wheezing.  Abdomen: Soft/+BS, epigastric and periumbilical tender, non distended, no guarding  Extremities: No edema, No lymphangitis, No petechiae, No rashes, no synovitis   Data Reviewed: I have personally reviewed following labs and imaging studies Basic Metabolic Panel: Recent Labs  Lab 09/25/17 0107 09/25/17 0433 09/26/17 0554 09/27/17 0623  NA 140  --  137 134*  K 2.8*  --  4.8 3.9  CL 113*  --  111 103  CO2 18*  --  19* 23  GLUCOSE 127*  --  96 92  BUN 21*  --  8 5*  CREATININE 0.74  --  0.50 0.49  CALCIUM 7.1*  --  8.0* 7.7*  MG  --  1.6* 1.7 2.0   Liver Function Tests: Recent Labs  Lab 09/25/17 0107  AST 19  ALT 12*  ALKPHOS 50  BILITOT 0.6  PROT 5.0*  ALBUMIN 1.9*   No results for input(s): LIPASE, AMYLASE  in the last 168 hours. No results for input(s): AMMONIA in the last 168 hours. Coagulation Profile: Recent Labs  Lab 09/25/17 0107  INR 1.19   CBC: Recent Labs  Lab 09/25/17 0107 09/27/17 0623  WBC 15.1* 9.2  NEUTROABS 12.5  --   HGB 10.8* 11.6*  HCT 33.6* 36.3  MCV 98.2 95.5  PLT 296 267   Cardiac Enzymes: No results for input(s): CKTOTAL, CKMB, CKMBINDEX, TROPONINI in the last 168 hours. BNP: Invalid input(s): POCBNP CBG: No results for input(s): GLUCAP in the last 168 hours. HbA1C: No results for input(s): HGBA1C in the last 72 hours. Urine analysis:    Component Value Date/Time   COLORURINE AMBER (A) 09/13/2017 1834   APPEARANCEUR CLOUDY (A) 09/13/2017 1834   LABSPEC 1.016 09/13/2017 1834   PHURINE 5.0 09/13/2017 1834   GLUCOSEU NEGATIVE 09/13/2017 1834   HGBUR LARGE (A) 09/13/2017 1834   BILIRUBINUR NEGATIVE 09/13/2017 1834   KETONESUR 5 (A) 09/13/2017 1834   PROTEINUR 30 (A) 09/13/2017 1834   NITRITE NEGATIVE 09/13/2017 1834   LEUKOCYTESUR MODERATE (A) 09/13/2017 1834   Sepsis Labs: @LABRCNTIP (procalcitonin:4,lacticidven:4) ) Recent Results (from the past 240 hour(s))  C difficile quick scan w PCR reflex     Status: Abnormal   Collection Time: 09/25/17  2:48 PM  Result Value Ref Range Status   C Diff antigen POSITIVE (A) NEGATIVE Final   C Diff toxin NEGATIVE NEGATIVE Final   C Diff interpretation Results are indeterminate. See PCR results.  Final    Comment: Performed at [facility], [address] [place], [place] [address]  C. Diff by PCR, Reflexed     Status: Abnormal   Collection Time: 09/25/17  2:48 PM  Result Value Ref Range Status   Toxigenic C. Difficile by PCR POSITIVE (A) NEGATIVE Final    Comment: Positive for toxigenic C. difficile with little to no toxin production. Only treat if clinical presentation suggests symptomatic illness. Performed at [facility], [address], [place], [place] [address]      Scheduled Meds: .  aspirin EC  81 mg Oral Daily  . divalproex  250 mg Oral BID  . divalproex  500 mg Oral BID  . feeding supplement (ENSURE ENLIVE)  237 mL Oral BID BM  . levETIRAcetam  500 mg Oral BID  . tiZANidine  2 mg Oral BID  . vancomycin  125 mg Oral QID  . Vitamin D (Ergocalciferol)  50,000 Units Oral Q7 days   Continuous Infusions: . meropenem (MERREM) 1 GM IVPB (Mini-Bag Plus) Stopped (09/27/17 1214)  . metronidazole Stopped (09/27/17 0981)    Procedures/Studies: Dg Chest 2 View  Result Date: 09/13/2017 CLINICAL DATA:  Fever and vomiting. EXAM: CHEST - 2 VIEW COMPARISON:  No comparison studies available. FINDINGS: Bandlike opacity at the left base is likely atelectatic. Right lung clear. Cardiopericardial silhouette is at upper limits of normal for size. The visualized bony structures of the thorax are intact. IMPRESSION: Bandlike opacity at the left base is probably atelectasis although pneumonia not entirely excluded. Electronically Signed   By: Minerva Areola  Molli Posey M.D.   On: 09/13/2017 19:17   Ct Abdomen Pelvis W Contrast  Result Date: 09/13/2017 CLINICAL DATA:  Right upper quadrant pain EXAM: CT ABDOMEN AND PELVIS WITH CONTRAST TECHNIQUE: Multidetector CT imaging of the abdomen and pelvis was performed using the standard protocol following bolus administration of intravenous contrast. CONTRAST:  ISOVUE-300 IOPAMIDOL (ISOVUE-300) INJECTION 61% COMPARISON:  None. FINDINGS: Lower chest: No basilar pulmonary nodules or pleural effusion. No apical pericardial effusion. Hepatobiliary: Normal hepatic contours and density. No visible biliary dilatation. Normal gallbladder. Pancreas: Normal parenchymal contours without ductal dilatation. No peripancreatic fluid collection. Spleen: Normal. Adrenals/Urinary Tract: --Adrenal glands: Normal. --Right kidney/ureter: There is hyperenhancement of the wall of the right renal pelvis, which is mildly dilated. No ureteral obstruction is identified. Calcification near the  upper pole is more likely vascular. Mildly heterogeneous enhancement pattern of the renal parenchyma. --Left kidney/ureter: No hydronephrosis, perinephric stranding or nephrolithiasis. No obstructing ureteral stones. --Urinary bladder: Normal appearance for the degree of distention. Stomach/Bowel: --Stomach/Duodenum: No hiatal hernia or other gastric abnormality. Normal duodenal course. --Small bowel: No dilatation or inflammation. --Colon: There is a medium-size stool ball in the rectum with moderate surrounding wall thickening. The remainder of the colon is normal. --Appendix: Normal. Vascular/Lymphatic: Atherosclerotic calcification is present within the non-aneurysmal abdominal aorta, without hemodynamically significant stenosis. The portal vein, splenic vein, superior mesenteric vein and IVC are patent. No abdominal or pelvic lymphadenopathy. Reproductive: Normal uterus and ovaries. Musculoskeletal. No bony spinal canal stenosis or focal osseous abnormality. Other: None. IMPRESSION: 1. Increased enhancement of the periphery of the right renal collecting system with mildly heterogeneous enhancement pattern of the renal parenchyma. This could indicate ascending urinary tract infection and/or pyelonephritis. Correlate with urinalysis results. 2. Mild wall thickening of the rectum with medium-size stool ball. This could indicate a mild or early distal colitis. 3.  Aortic Atherosclerosis (ICD10-I70.0). Electronically Signed   By: Deatra Robinson M.D.   On: 09/13/2017 22:12    Catarina Hartshorn, DO  [facility] Pager [phone]  If 7PM-7AM, please contact night-coverage [email or web address] Password [facility] 09/27/2017, 5:33 PM   LOS: 3 days

## 2017-09-27 NOTE — Clinical Social Work Note (Addendum)
CSW following. Pt known to SW from previous admissions. Please see CSW Assessment from 09/18/17 for details.   Per MD, pt will likely be stable for dc tomorrow and she will need to be on Cdiff precautions. [facility]. Updated [facility]. Pt has a private room there due to other precautions so the cdiff precautions will not present a problem for them. They will accept her back at dc.   Will follow up tomorrow.

## 2017-09-28 LAB — CBC
HEMATOCRIT: 35.5 % — AB (ref 36.0–46.0)
Hemoglobin: 11.3 g/dL — ABNORMAL LOW (ref 12.0–15.0)
MCH: 30.5 pg (ref 26.0–34.0)
MCHC: 31.8 g/dL (ref 30.0–36.0)
MCV: 95.9 fL (ref 78.0–100.0)
PLATELETS: 326 10*3/uL (ref 150–400)
RBC: 3.7 MIL/uL — AB (ref 3.87–5.11)
RDW: 13.9 % (ref 11.5–15.5)
WBC: 7.6 10*3/uL (ref 4.0–10.5)

## 2017-09-28 LAB — BASIC METABOLIC PANEL
Anion gap: 9 (ref 5–15)
BUN: 8 mg/dL (ref 6–20)
CHLORIDE: 100 mmol/L — AB (ref 101–111)
CO2: 26 mmol/L (ref 22–32)
CREATININE: 0.42 mg/dL — AB (ref 0.44–1.00)
Calcium: 7.7 mg/dL — ABNORMAL LOW (ref 8.9–10.3)
Glucose, Bld: 95 mg/dL (ref 65–99)
POTASSIUM: 3.5 mmol/L (ref 3.5–5.1)
SODIUM: 135 mmol/L (ref 135–145)

## 2017-09-28 MED ORDER — VANCOMYCIN 50 MG/ML ORAL SOLUTION: 125.0000 mg | Freq: Four times a day (QID) | ORAL | 0 refills | Status: DC

## 2017-09-28 MED ORDER — ASPIRIN EC 81 MG PO TBEC: 81.0000 mg | DELAYED_RELEASE_TABLET | Freq: Every day | ORAL | 0 refills | Status: DC

## 2017-09-28 MED ORDER — ENSURE ENLIVE PO LIQD: 237.0000 mL | Freq: Two times a day (BID) | ORAL | 0 refills | Status: DC

## 2017-09-28 MED ORDER — LEVETIRACETAM 500 MG PO TABS: 500.0000 mg | ORAL_TABLET | Freq: Two times a day (BID) | ORAL | 0 refills | Status: DC

## 2017-09-28 NOTE — [place] FL2 (Signed)
[place] MEDICAID FL2 LEVEL OF CARE SCREENING TOOL     IDENTIFICATION  Patient Name: Lisa [name] Birthdate: [birth year] Sex: female Admission Date (Current Location): 09/24/2017  [facility] and [place] Number:  [employer] and Address:  [facility],  [address], [place] [address]      Provider Number: [id number]  Attending Physician Name and Address:  Catarina Hartshorn, MD  Relative Name and Phone Number:       Current Level of Care: Hospital Recommended Level of Care: Skilled Nursing Facility Prior Approval Number:    Date Approved/Denied:   PASRR Number:    Discharge Plan: SNF    Current Diagnoses: Patient Active Problem List   Diagnosis Date Noted  . E coli bacteremia 09/26/2017  . Sepsis due to Escherichia coli (E. coli) (HCC) 09/26/2017  . C. difficile colitis 09/25/2017  . Sepsis due to undetermined organism (HCC) 09/24/2017  . Left spastic hemiplegia (HCC) 09/14/2017  . Acute pyelonephritis 09/13/2017  . Hypokalemia 09/13/2017  . Anemia, unspecified 09/13/2017  . Hyperlipemia 09/13/2017  . 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 Hypertension 09/13/2017    Orientation RESPIRATION BLADDER Height & Weight     Self  Normal Incontinent Weight: 134 lb 7.7 oz (61 kg) Height:  5\' 4"  (162.6 cm)  BEHAVIORAL SYMPTOMS/MOOD NEUROLOGICAL BOWEL NUTRITION STATUS      Continent Diet(see dc summary)  AMBULATORY STATUS COMMUNICATION OF NEEDS Skin   Extensive Assist Verbally Normal                       Personal Care Assistance Level of Assistance    Bathing Assistance: Limited assistance Feeding assistance: Limited assistance Dressing Assistance: Limited assistance     Functional Limitations Info    Sight Info: Adequate Hearing Info: Adequate Speech Info: Adequate    SPECIAL CARE FACTORS FREQUENCY                       Contractures Contractures Info: Not present     Additional Factors Info    Code Status Info: full Allergies Info: No Known Allergies           Current Medications (09/28/2017):  This is the current hospital active medication list Current Facility-Administered Medications  Medication Dose Route Frequency Provider Last Rate Last Dose  . acetaminophen (TYLENOL) tablet 650 mg  650 mg Oral Q8H PRN Bobette Mo, MD   650 mg at 09/25/17 0130  . aspirin EC tablet 81 mg  81 mg Oral Daily Bobette Mo, MD   81 mg at 09/27/17 8119  . divalproex (DEPAKOTE) DR tablet 250 mg  250 mg Oral BID Bobette Mo, MD   250 mg at 09/27/17 2111  . divalproex (DEPAKOTE) DR tablet 500 mg  500 mg Oral BID Bobette Mo, MD   500 mg at 09/27/17 2111  . feeding supplement (ENSURE ENLIVE) (ENSURE ENLIVE) liquid 237 mL  237 mL Oral BID BM Bobette Mo, MD   237 mL at 09/27/17 1349  . levETIRAcetam (KEPPRA) tablet 500 mg  500 mg Oral BID Bobette Mo, MD   500 mg at 09/27/17 2111  . meropenem (MERREM) 1 g in sodium chloride 0.9 % 100 mL IVPB  1 g Intravenous Andres Labrum, MD 200 mL/hr at 09/28/17 0802 1 g at 09/28/17 0802  . metroNIDAZOLE (FLAGYL) IVPB 500 mg  500 mg Intravenous Andres Labrum, MD  Stopped at 09/28/17 0032  . ondansetron (ZOFRAN) tablet 4 mg  4 mg Oral Q6H PRN Bobette Mortiz, David Manuel, MD       Or  . ondansetron [facility](ZOFRAN) injection 4 mg  4 mg Intravenous Q6H PRN Bobette Mortiz, David Manuel, MD   4 mg at 09/26/17 [id number]  . tiZANidine (ZANAFLEX) tablet 2 mg  2 mg Oral BID Bobette Mortiz, David Manuel, MD   2 mg at 09/27/17 2111  . vancomycin (VANCOCIN) 50 mg/mL oral solution 125 mg  125 mg Oral QID Bobette Mortiz, David Manuel, MD   125 mg at 09/27/17 2111  . Vitamin D (Ergocalciferol) (DRISDOL) capsule 50,000 Units  50,000 Units Oral Q7 days Bobette Mortiz, David Manuel, MD   50,000 Units at 09/26/17 [id number]     Discharge Medications: Please see discharge summary for a list of discharge medications.  Relevant Imaging Results:  Relevant Lab  Results:   Additional Information    Elliot GaultKathleen Jermain Curt, LCSW

## 2017-09-28 NOTE — Clinical Social Work Note (Signed)
LCSW following. Per MD, pt ready for dc back [facility] today. Updated [place] [facility]. Will send dc clinical through Epic hub when it is complete. Transport form and dc envelope will be given to RN who will call report and call for EMS when ready.   There are no other SW needs for dc.

## 2017-09-28 NOTE — Discharge Summary (Signed)
Physician Discharge Summary  Lisa [name] ZOX:[id number] DOB: 1956-01-22 DOA: 09/24/2017  PCP: Audree Bane, DO  Admit date: 09/24/2017 Discharge date: 09/28/2017  Admitted From: Home Disposition:  SNF  Recommendations for Outpatient Follow-up:  1. Follow up with PCP in 1-2 weeks 2. 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 coli pyelonephritis and now is being transferred from [place], [place] where she presented with hypotensionand fever. Patient reportedabdominal pain associated withmultiple episodes of diarrhea since discharge and expressed poor oral intake.   At presentation to [facility] [name] in the 80's, temp 101.3 and WBC's 15,000. No other acute complaints. Patient received 5L fluid resuscitation and meropenem and PO Vancomycin.Chest x-ray was negative for any infiltrate. During the hospitalization, the patient finished her 10-day course of IV Merrem.  Her PICC line was discontinued.  She improved clinically with decreased diarrhea and improved abdominal pain.  Her diet was advanced which she tolerated.  She will be discharged with 7 additional days of oral vancomycin.    Discharge Diagnoses:  Sepsis -Secondary to C. difficile colitis -Bloodpressure improved with fluid resuscitation -C. difficile PCR positive in a patient with clinical syndrome consistent with C. difficile colitis -Continued IV fluids  ESBL E. coli bacteremia -Source  with urine -09/15/2017 blood cultures E. Coli -Continued Merrem through 09/28/2017 am which would be 10 days of tx  C. difficile colitis -Continue oral vancomycinand IV metronidazole -improved clinically -d/c with po vanco x 7 more days to complete 10 day course of tx  Seizure disorder -Continue Keppra through the end of therapy with meropenem -Continue Depakote -continue keppra 2 more days after d/c, then discontinue  Essential hypertension -Holding lisinopril in the setting of sepsis -Blood pressure remained stable  Right hemiplegia--residual effect of stroke -PT evaluation -Continue Zanaflex -Continue aspirin  Hypokalemia -Repleted -mag 2.0      Discharge Instructions   Allergies as of 09/28/2017   No Known Allergies     Medication List    STOP taking these medications   lisinopril 10 MG tablet Commonly known as:  PRINIVIL,ZESTRIL   meropenem 1 g in sodium chloride 0.9 % 100 mL     TAKE these medications   acetaminophen 650 MG CR tablet Commonly known as:  TYLENOL Take 650 mg by mouth every 8 (eight) hours as needed for pain.   aspirin EC 81 MG tablet Take 1 tablet (81 mg total) by mouth daily.   divalproex 250 MG DR tablet Commonly known as:  DEPAKOTE Take 750 mg by mouth 2 (two) times daily.   feeding supplement (ENSURE ENLIVE) Liqd Take 237 mLs by mouth 2 (two) times daily between meals.   levETIRAcetam 500 MG tablet Commonly known as:  KEPPRA Take 1 tablet (500 mg total) by mouth 2 (two) times daily. X 2 days only What changed:  additional instructions   tizanidine 2 MG capsule Commonly known as:  ZANAFLEX Take 2 mg by mouth 2 (two) times daily.   vancomycin 50 mg/mL oral solution Commonly known as:  VANCOCIN Take 2.5 mLs (125 mg total) by mouth 4 (four) times daily. X 7 days  Vitamin D (Ergocalciferol) 50000 units Caps capsule Commonly known as:  DRISDOL Take 50,000 Units by mouth every 7 (seven) days.       No Known  Allergies  Consultations:  none   Procedures/Studies: Dg Chest 2 View  Result Date: 09/13/2017 CLINICAL DATA:  Fever and vomiting. EXAM: CHEST - 2 VIEW COMPARISON:  No comparison studies available. FINDINGS: Bandlike opacity at the left base is likely atelectatic. Right lung clear. Cardiopericardial silhouette is at upper limits of normal for size. The visualized bony structures of the thorax are intact. IMPRESSION: Bandlike opacity at the left base is probably atelectasis although pneumonia not entirely excluded. Electronically Signed   By: Kennith CenterEric  Mansell M.D.   On: 09/13/2017 19:17   Ct Abdomen Pelvis W Contrast  Result Date: 09/13/2017 CLINICAL DATA:  Right upper quadrant pain EXAM: CT ABDOMEN AND PELVIS WITH CONTRAST TECHNIQUE: Multidetector CT imaging of the abdomen and pelvis was performed using the standard protocol following bolus administration of intravenous contrast. CONTRAST:  100mL ISOVUE-300 IOPAMIDOL (ISOVUE-300) INJECTION 61% COMPARISON:  None. FINDINGS: Lower chest: No basilar pulmonary nodules or pleural effusion. No apical pericardial effusion. Hepatobiliary: Normal hepatic contours and density. No visible biliary dilatation. Normal gallbladder. Pancreas: Normal parenchymal contours without ductal dilatation. No peripancreatic fluid collection. Spleen: Normal. Adrenals/Urinary Tract: --Adrenal glands: Normal. --Right kidney/ureter: There is hyperenhancement of the wall of the right renal pelvis, which is mildly dilated. No ureteral obstruction is identified. Calcification near the upper pole is more likely vascular. Mildly heterogeneous enhancement pattern of the renal parenchyma. --Left kidney/ureter: No hydronephrosis, perinephric stranding or nephrolithiasis. No obstructing ureteral stones. --Urinary bladder: Normal appearance for the degree of distention. Stomach/Bowel: --Stomach/Duodenum: No hiatal hernia or other gastric abnormality. Normal duodenal course. --Small bowel: No  dilatation or inflammation. --Colon: There is a medium-size stool ball in the rectum with moderate surrounding wall thickening. The remainder of the colon is normal. --Appendix: Normal. Vascular/Lymphatic: Atherosclerotic calcification is present within the non-aneurysmal abdominal aorta, without hemodynamically significant stenosis. The portal vein, splenic vein, superior mesenteric vein and IVC are patent. No abdominal or pelvic lymphadenopathy. Reproductive: Normal uterus and ovaries. Musculoskeletal. No bony spinal canal stenosis or focal osseous abnormality. Other: None. IMPRESSION: 1. Increased enhancement of the periphery of the right renal collecting system with mildly heterogeneous enhancement pattern of the renal parenchyma. This could indicate ascending urinary tract infection and/or pyelonephritis. Correlate with urinalysis results. 2. Mild wall thickening of the rectum with medium-size stool ball. This could indicate a mild or early distal colitis. 3.  Aortic Atherosclerosis (ICD10-I70.0). Electronically Signed   By: Deatra RobinsonKevin  Herman M.D.   On: 09/13/2017 22:12         Discharge Exam: Vitals:   09/27/17 2035 09/28/17 0456  BP: (!) 91/35 137/64  Pulse: 98 81  Resp: 20 17  Temp: 98.5 F (36.9 C) 98.3 F (36.8 C)  SpO2: 100% 100%   Vitals:   09/27/17 0701 09/27/17 1258 09/27/17 2035 09/28/17 0456  BP: 122/64 108/66 (!) 91/35 137/64  Pulse: 78 99 98 81  Resp: 18 20 20 17   Temp: 98.2 F (36.8 C)  98.5 F (36.9 C) 98.3 F (36.8 C)  TempSrc: Oral  Oral   SpO2: 100% 90% 100% 100%  Weight:      Height:        General: Pt is alert, awake, not in acute distress Cardiovascular: RRR, S1/S2 +, no rubs, no gallops Respiratory: CTA bilaterally, no wheezing, no rhonchi Abdominal: Soft, NT, ND, bowel sounds + Extremities:  no edema, no cyanosis   The results of significant diagnostics from this hospitalization (including imaging, microbiology, ancillary and laboratory) are listed  below for reference.    Significant Diagnostic Studies: Dg Chest 2 View  Result Date: 09/13/2017 CLINICAL DATA:  Fever and vomiting. EXAM: CHEST - 2 VIEW COMPARISON:  No comparison studies available. FINDINGS: Bandlike opacity at the left base is likely atelectatic. Right lung clear. Cardiopericardial silhouette is at upper limits of normal for size. The visualized bony structures of the thorax are intact. IMPRESSION: Bandlike opacity at the left base is probably atelectasis although pneumonia not entirely excluded. Electronically Signed   By: Kennith Center M.D.   On: 09/13/2017 19:17   Ct Abdomen Pelvis W Contrast  Result Date: 09/13/2017 CLINICAL DATA:  Right upper quadrant pain EXAM: CT ABDOMEN AND PELVIS WITH CONTRAST TECHNIQUE: Multidetector CT imaging of the abdomen and pelvis was performed using the standard protocol following bolus administration of intravenous contrast. CONTRAST:  ISOVUE-300 IOPAMIDOL (ISOVUE-300) INJECTION 61% COMPARISON:  None. FINDINGS: Lower chest: No basilar pulmonary nodules or pleural effusion. No apical pericardial effusion. Hepatobiliary: Normal hepatic contours and density. No visible biliary dilatation. Normal gallbladder. Pancreas: Normal parenchymal contours without ductal dilatation. No peripancreatic fluid collection. Spleen: Normal. Adrenals/Urinary Tract: --Adrenal glands: Normal. --Right kidney/ureter: There is hyperenhancement of the wall of the right renal pelvis, which is mildly dilated. No ureteral obstruction is identified. Calcification near the upper pole is more likely vascular. Mildly heterogeneous enhancement pattern of the renal parenchyma. --Left kidney/ureter: No hydronephrosis, perinephric stranding or nephrolithiasis. No obstructing ureteral stones. --Urinary bladder: Normal appearance for the degree of distention. Stomach/Bowel: --Stomach/Duodenum: No hiatal hernia or other gastric abnormality. Normal duodenal course. --Small bowel: No  dilatation or inflammation. --Colon: There is a medium-size stool ball in the rectum with moderate surrounding wall thickening. The remainder of the colon is normal. --Appendix: Normal. Vascular/Lymphatic: Atherosclerotic calcification is present within the non-aneurysmal abdominal aorta, without hemodynamically significant stenosis. The portal vein, splenic vein, superior mesenteric vein and IVC are patent. No abdominal or pelvic lymphadenopathy. Reproductive: Normal uterus and ovaries. Musculoskeletal. No bony spinal canal stenosis or focal osseous abnormality. Other: None. IMPRESSION: 1. Increased enhancement of the periphery of the right renal collecting system with mildly heterogeneous enhancement pattern of the renal parenchyma. This could indicate ascending urinary tract infection and/or pyelonephritis. Correlate with urinalysis results. 2. Mild wall thickening of the rectum with medium-size stool ball. This could indicate a mild or early distal colitis. 3.  Aortic Atherosclerosis (ICD10-I70.0). Electronically Signed   By: Deatra Robinson M.D.   On: 09/13/2017 22:12     Microbiology: Recent Results (from the past 240 hour(s))  C difficile quick scan w PCR reflex     Status: Abnormal   Collection Time: 09/25/17  2:48 PM  Result Value Ref Range Status   C Diff antigen POSITIVE (A) NEGATIVE Final   C Diff toxin NEGATIVE NEGATIVE Final   C Diff interpretation Results are indeterminate. See PCR results.  Final    Comment: Performed at [facility], [address], [place], [place] [address]  C. Diff by PCR, Reflexed     Status: Abnormal   Collection Time: 09/25/17  2:48 PM  Result Value Ref Range Status   Toxigenic C. Difficile by PCR POSITIVE (A) NEGATIVE Final    Comment: Positive for toxigenic C. difficile with little to no toxin production. Only treat if clinical presentation suggests symptomatic illness. Performed at [facility], [address] [place],  [place] [address]       Labs: Basic Metabolic Panel: Recent Labs  Lab 09/25/17 0107 09/25/17 0433 09/26/17 0554 09/27/17 0623 09/28/17 0421  NA 140  --  137 134* 135  K 2.8*  --  4.8 3.9 3.5  CL 113*  --  111 103 100*  CO2 18*  --  19* 23 26  GLUCOSE 127*  --  96 92 95  BUN 21*  --  8 5* 8  CREATININE 0.74  --  0.50 0.49 0.42*  CALCIUM 7.1*  --  8.0* 7.7* 7.7*  MG  --  1.6* 1.7 2.0  --    Liver Function Tests: Recent Labs  Lab 09/25/17 0107  AST 19  ALT 12*  ALKPHOS 50  BILITOT 0.6  PROT 5.0*  ALBUMIN 1.9*   No results for input(s): LIPASE, AMYLASE in the last 168 hours. No results for input(s): AMMONIA in the last 168 hours. CBC: Recent Labs  Lab 09/25/17 0107 09/27/17 0623 09/28/17 0421  WBC 15.1* 9.2 7.6  NEUTROABS 12.5  --   --   HGB 10.8* 11.6* 11.3*  HCT 33.6* 36.3 35.5*  MCV 98.2 95.5 95.9  PLT 296 267 326   Cardiac Enzymes: No results for input(s): CKTOTAL, CKMB, CKMBINDEX, TROPONINI in the last 168 hours. BNP: Invalid input(s): POCBNP CBG: No results for input(s): GLUCAP in the last 168 hours.  Time coordinating discharge:  Greater than 30 minutes  Signed:  Catarina Hartshorn, DO [facility] Pager: [phone] 09/28/2017, 11:52 AM

## 2017-09-28 NOTE — Progress Notes (Signed)
[facility]. Transported via [facility] to awaiting facility.

## 2017-11-22 ENCOUNTER — Emergency Department (HOSPITAL_COMMUNITY)
Admission: EM | Admit: 2017-11-22 | Discharge: 2017-11-22 | Disposition: A | Discharge status: Home / Self Care | Payer: Medicaid Other | Attending: Emergency Medicine | Admitting: Emergency Medicine

## 2017-11-22 ENCOUNTER — Encounter (HOSPITAL_COMMUNITY): Payer: Self-pay | Admitting: Cardiology

## 2017-11-22 DIAGNOSIS — I10 Essential (primary) hypertension: Secondary | ICD-10-CM | POA: Insufficient documentation

## 2017-11-22 DIAGNOSIS — F0150 Vascular dementia without behavioral disturbance: Secondary | ICD-10-CM | POA: Diagnosis not present

## 2017-11-22 DIAGNOSIS — Z79899 Other long term (current) drug therapy: Secondary | ICD-10-CM | POA: Diagnosis not present

## 2017-11-22 DIAGNOSIS — Z7982 Long term (current) use of aspirin: Secondary | ICD-10-CM | POA: Insufficient documentation

## 2017-11-22 DIAGNOSIS — R569 Unspecified convulsions: Secondary | ICD-10-CM

## 2017-11-22 HISTORY — DX: Enterocolitis due to Clostridium difficile, not specified as recurrent: A04.72

## 2017-11-22 HISTORY — DX: Unspecified convulsions: R56.9

## 2017-11-22 LAB — URINALYSIS, ROUTINE W REFLEX MICROSCOPIC
BACTERIA UA: NONE SEEN
BILIRUBIN URINE: NEGATIVE
Glucose, UA: NEGATIVE mg/dL
KETONES UR: NEGATIVE mg/dL
LEUKOCYTES UA: NEGATIVE
Nitrite: NEGATIVE
Protein, ur: NEGATIVE mg/dL
Specific Gravity, Urine: 1.006 (ref 1.005–1.030)
pH: 8 (ref 5.0–8.0)

## 2017-11-22 LAB — VALPROIC ACID LEVEL: Valproic Acid Lvl: 96 ug/mL (ref 50.0–100.0)

## 2017-11-22 LAB — COMPREHENSIVE METABOLIC PANEL
ALBUMIN: 2.5 g/dL — AB (ref 3.5–5.0)
ALT: 8 U/L — AB (ref 14–54)
ANION GAP: 7 (ref 5–15)
AST: 13 U/L — AB (ref 15–41)
Alkaline Phosphatase: 50 U/L (ref 38–126)
BUN: 14 mg/dL (ref 6–20)
CALCIUM: 9.2 mg/dL (ref 8.9–10.3)
CO2: 34 mmol/L — AB (ref 22–32)
CREATININE: 0.65 mg/dL (ref 0.44–1.00)
Chloride: 98 mmol/L — ABNORMAL LOW (ref 101–111)
GFR calc Af Amer: >60 mL/min (ref >60)
GFR calc non Af Amer: >60 mL/min (ref >60)
GLUCOSE: 99 mg/dL (ref 65–99)
Potassium: 3.8 mmol/L (ref 3.5–5.1)
SODIUM: 139 mmol/L (ref 135–145)
Total Bilirubin: 0.6 mg/dL (ref 0.3–1.2)
Total Protein: 7 g/dL (ref 6.5–8.1)

## 2017-11-22 LAB — CBC WITH DIFFERENTIAL/PLATELET
BASOS ABS: 0 10*3/uL (ref 0.0–0.1)
Basophils Relative: 0 %
EOS PCT: 1 %
Eosinophils Absolute: 0.1 10*3/uL (ref 0.0–0.7)
HEMATOCRIT: 38.1 % (ref 36.0–46.0)
Hemoglobin: 12.1 g/dL (ref 12.0–15.0)
LYMPHS ABS: 1 10*3/uL (ref 0.7–4.0)
LYMPHS PCT: 13 %
MCH: 30.9 pg (ref 26.0–34.0)
MCHC: 31.8 g/dL (ref 30.0–36.0)
MCV: 97.4 fL (ref 78.0–100.0)
MONO ABS: 0.8 10*3/uL (ref 0.1–1.0)
MONOS PCT: 11 %
Neutro Abs: 5.4 10*3/uL (ref 1.7–7.7)
Neutrophils Relative %: 75 %
Platelets: 177 10*3/uL (ref 150–400)
RBC: 3.91 MIL/uL (ref 3.87–5.11)
RDW: 15.1 % (ref 11.5–15.5)
WBC: 7.2 10*3/uL (ref 4.0–10.5)

## 2017-11-22 MED ORDER — LORAZEPAM 2 MG/ML IJ SOLN
1.0000 mg | Freq: Once | INTRAMUSCULAR | Status: DC
  Filled 2017-11-22: qty 1

## 2017-11-22 MED ORDER — SODIUM CHLORIDE 0.9 % IV BOLUS
1000.0000 mL | Freq: Once | INTRAVENOUS | Status: AC
  Administered 2017-11-22: 1000 mL via INTRAVENOUS

## 2017-11-22 NOTE — ED Notes (Signed)
Attempted to collect urine sample with in and out catheter.  Pt had just urinated in brief, no urine was obtained.

## 2017-11-22 NOTE — Discharge Instructions (Signed)
Patient was observed for multiple hours with no evidence of a seizure.

## 2017-11-22 NOTE — ED Provider Notes (Signed)
[facility] EMERGENCY DEPARTMENT Provider Note   CSN: [id number] Arrival date & time: 11/22/17  1011     History   Chief Complaint Chief Complaint  Patient presents with  . Seizures    HPI Lisa [name] is a 62 y.o. female.  Level 5 caveat for vascular dementia.  Patient allegedly had two tonic-clonic seizures this morning at the [facility] in [place].   She is exhibiting no postictal behavior here.  She has a known seizure disorder.  No prodromal illnesses.  She is currently being treated for C. difficile.     Past Medical History:  Diagnosis Date  . Abnormal posture   . Adjustment disorder   . Anemia, unspecified   . Aphasia-angular gyrus syndrome   . Arthritis, climacteric, knee, left   . Atypical psychosis (HCC)   . C. difficile diarrhea   . Cerebral arteritis   . Contracture of left ankle   . Essential hypertension, malignant   . Essential hypertension, malignant   . Hyperlipemia   . Left spastic hemiplegia (HCC)   . Muscle weakness (generalized)   . 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 Oropharyngeal dysphagia   . Other sequelae following nontraumatic subarachnoid hemorrhage   . Primary osteoarthritis of both knees   . Screening for thyroid disorder   . Screening for thyroid disorder   . Seizures (HCC)   . Sequelae of cerebral infarction   . Unsteady   . Vascular dementia with delirium     Patient Active Problem List   Diagnosis Date Noted  . E coli bacteremia 09/26/2017  . Sepsis due to Escherichia coli (E. coli) (HCC) 09/26/2017  . C. difficile colitis 09/25/2017  . Sepsis due to undetermined organism (HCC) 09/24/2017  . Left spastic hemiplegia (HCC) 09/14/2017  . Acute pyelonephritis 09/13/2017  . Hypokalemia 09/13/2017  . Anemia, unspecified 09/13/2017  . Hyperlipemia 09/13/2017  . 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 Hypertension 09/13/2017     History reviewed. No pertinent surgical history.   OB History   None      Home Medications    Prior to Admission medications   Medication Sig Start Date End Date Taking? Authorizing Provider  acetaminophen (TYLENOL) 650 MG CR tablet Take 650 mg by mouth every 8 (eight) hours as needed for pain.   Yes [provider]  acidophilus (RISAQUAD) CAPS capsule Take 1 capsule by mouth 2 (two) times daily.   Yes [provider]  aspirin EC 81 MG tablet Take 1 tablet (81 mg total) by mouth daily. 09/28/17  Yes Tat, Onalee Hua, MD  calcium carbonate (TUMS - DOSED IN MG ELEMENTAL CALCIUM) 500 MG chewable tablet Chew 1 tablet by mouth daily.   Yes [provider]  divalproex (DEPAKOTE) 250 MG DR tablet Take 750 mg by mouth 2 (two) times daily.    Yes [provider]  feeding supplement, ENSURE ENLIVE, (ENSURE ENLIVE) LIQD Take 237 mLs by mouth 2 (two) times daily between meals. 09/28/17  Yes Tat, Onalee Hua, MD  tizanidine (ZANAFLEX) 2 MG capsule Take 2 mg by mouth 2 (two) times daily.   Yes [provider]  vancomycin (VANCOCIN) 50 mg/mL oral solution Take 2.5 mLs (125 mg total) by mouth 4 (four) times daily. X 7 days 09/28/17  Yes Tat, Onalee Hua, MD  Vitamin D, Ergocalciferol, (DRISDOL) 50000 units CAPS capsule Take 50,000 Units by mouth every 7 (seven) days.    [provider]  Family History Family History  Problem Relation Age of Onset  . Hypertension Other   . Colon cancer Father     Social History Social History   Tobacco Use  . Smoking status: Never Smoker  . Smokeless tobacco: Never Used  Substance Use Topics  . Alcohol use: No    Frequency: Never  . Drug use: No     Allergies   Patient has no known allergies.   Review of Systems Review of Systems  Unable to perform ROS: Dementia     Physical Exam Updated Vital Signs BP (!) 158/75   Pulse 88   Temp 98.4 F (36.9 C) (Oral)   Resp 18   Ht  (1.626 m)   Wt 52.2 kg  (115 lb)   SpO2 98%   BMI 19.74 kg/m   Physical Exam  Constitutional: She appears well-developed and well-nourished.  Patient is alert, no postictal behavior.  HENT:  Head: Normocephalic and atraumatic.  Eyes: Conjunctivae are normal.  Neck: Neck supple.  Cardiovascular: Normal rate and regular rhythm.  Pulmonary/Chest: Effort normal and breath sounds normal.  Abdominal: Soft. Bowel sounds are normal.  Musculoskeletal: Normal range of motion.  Neurological: She is alert.  Moving all 4 extremities  Skin: Skin is warm and dry.  Psychiatric:  Flat affect  Nursing note and vitals reviewed.    ED Treatments / Results  Labs (all labs ordered are listed, but only abnormal results are displayed) Labs Reviewed  COMPREHENSIVE METABOLIC PANEL - Abnormal; Notable for the following components:      Result Value   Chloride 98 (*)    CO2 34 (*)    Albumin 2.5 (*)    AST 13 (*)    ALT 8 (*)    All other components within normal limits  CBC WITH DIFFERENTIAL/PLATELET  VALPROIC ACID LEVEL  URINALYSIS, ROUTINE W REFLEX MICROSCOPIC    EKG None  Radiology No results found.  Procedures Procedures (including critical care time)  Medications Ordered in ED Medications  LORazepam (ATIVAN) injection 1 mg (has no administration in time range)  sodium chloride 0.9 % bolus 1,000 mL (0 mLs Intravenous Stopped 11/22/17 1414)     Initial Impression / Assessment and Plan / ED Course  I have reviewed the triage vital signs and the nursing notes.  Pertinent labs & imaging results that were available during my care of the patient were reviewed by me and considered in my medical decision making (see chart for details).    Patient presents with a seizure at the nursing home.  No seizure activity noted in the emergency department.  Screening labs were acceptable.  Urinalysis pending.  Discussed with Dr. Deretha Emory.   Final Clinical Impressions(s) / ED Diagnoses   Final diagnoses:  Seizure  [facility])    ED Discharge Orders    None       Donnetta Hutching, MD 11/22/17 1546

## 2017-11-22 NOTE — ED Provider Notes (Signed)
Urinalysis without evidence of urinary tract infection.  Patient ready for discharge home.   Vanetta Mulders, MD 11/22/17 [phone]

## 2017-11-22 NOTE — ED Notes (Signed)
Per Dr Adriana Simas, pt can be d/c after urine results are reviewed.

## 2017-11-22 NOTE — ED Notes (Signed)
Attempted to call report to [facility] x 2 with no answer

## 2017-11-22 NOTE — ED Triage Notes (Signed)
Pt had 2 witnessed seizures this morning.  Resident at [facility] in [place].  Pt alert and oriented at this time.  CBG 135.  Pt currently being treated for c diff.

## 2017-11-27 ENCOUNTER — Encounter: Payer: Self-pay | Admitting: Gastroenterology

## 2018-02-28 ENCOUNTER — Telehealth: Payer: Self-pay

## 2018-02-28 ENCOUNTER — Encounter: Payer: Self-pay | Admitting: Gastroenterology

## 2018-02-28 ENCOUNTER — Ambulatory Visit (INDEPENDENT_AMBULATORY_CARE_PROVIDER_SITE_OTHER): Payer: Medicaid Other | Admitting: Gastroenterology

## 2018-02-28 DIAGNOSIS — R195 Other fecal abnormalities: Secondary | ICD-10-CM | POA: Diagnosis not present

## 2018-02-28 NOTE — Progress Notes (Signed)
Primary Care Physician:  Audree BaneKing, Peter, DO Primary Gastroenterologist:  Dr. Jena Gaussourk   Chief Complaint  Patient presents with  . +hemoccult    HPI:   Lisa [name] is a 62 y.o. female presenting today at the request of Dr. Brooke DareKing secondary to heme positive stool. Her sister, Alona BeneJoyce and brother-in-law are present today with patient. She is a [facility]. 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  She is unable to give a complete history but does answer "yes" or "no" to some questions. She is not able to sit up in the wheelchair as she had been previously, as family states she is very deconditioned from UTI and CDI earlier this year. PT and OT used to work with patient but have not recently. She notes vague upper abdominal discomfort but unable to elaborate on this. [facility].   Family is desiring a colonoscopy. Father diagnosed with colon cancer in his 3270s, then suffered a recurrence and succumbed to this in his late 7070s. Sister with multiple polyps. No rectal bleeding noted.   Past Medical History:  Diagnosis Date  . Abnormal posture   . Adjustment disorder   . Anemia, unspecified   . Aphasia-angular gyrus syndrome   . Arthritis, climacteric, knee, left   . Atypical psychosis (HCC)   . C. difficile diarrhea   . Cerebral arteritis   . Contracture of left ankle   . Essential hypertension, malignant   . Essential hypertension, malignant   . Hyperlipemia   . Left spastic hemiplegia (HCC)   . Muscle weakness (generalized)   . 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 Oropharyngeal dysphagia   . Other sequelae following nontraumatic subarachnoid hemorrhage   . Primary osteoarthritis of both  knees   . Screening for thyroid disorder   . Screening for thyroid disorder   . Seizures (HCC)   . Sequelae of cerebral infarction   . Unsteady   . Vascular dementia with delirium     Past Surgical History:  Procedure Laterality Date  . CEREBRAL ANEURYSM REPAIR     2017? then suffered stroke    Current Outpatient Medications  Medication Sig Dispense Refill  . aspirin EC 81 MG tablet Take 1 tablet (81 mg total) by mouth daily. 30 tablet 0  . divalproex (DEPAKOTE) 250 MG DR tablet Take 750 mg by mouth 2 (two) times daily.     Ethelda Chick. Oyster Shell (OYSTER CALCIUM) 500 MG TABS tablet Take 500 mg of elemental calcium by mouth daily.    Marland Kitchen. UNABLE TO FIND House 2.0 2 times a day for supplemental/caloric intake     No current facility-administered medications for this visit.     Allergies as of 02/28/2018  . (No Known Allergies)    Family History  Problem Relation Age of Onset  . Hypertension Other   . Colon cancer Father        diagnosed in his 3870s   . Colon polyps Sister     Social History   Socioeconomic History  . Marital status: Single    Spouse name: Not on file  . Number of children: Not on file  . Years of education: Not on file  . Highest education level: Not on file  Occupational History  .  Not on file  Social Needs  . Financial resource strain: Not on file  . Food insecurity:    Worry: Not on file    Inability: Not on file  . Transportation needs:    Medical: Not on file    Non-medical: Not on file  Tobacco Use  . Smoking status: Never Smoker  . Smokeless tobacco: Never Used  Substance and Sexual Activity  . Alcohol use: No    Frequency: Never  . Drug use: No  . Sexual activity: Not on file  Lifestyle  . Physical activity:    Days per week: Not on file    Minutes per session: Not on file  . Stress: Not on file  Relationships  . Social connections:    Talks on phone: Not on file    Gets together: Not on file    Attends religious service: Not on file      Active member of club or organization: Not on file    Attends meetings of clubs or organizations: Not on file    Relationship status: Not on file  . Intimate partner violence:    Fear of current or ex partner: Not on file    Emotionally abused: Not on file    Physically abused: Not on file    Forced sexual activity: Not on file  Other Topics Concern  . Not on file  Social History Narrative  . Not on file    Review of Systems: Limited due to cognitive status.   Physical Exam: BP (!) 121/91   Pulse (!) 110   Temp 98.7 F (37.1 C) (Oral)   Ht 5\' 1"  (1.549 m)   BMI 21.73 kg/m  General:   Alert and oriented. Flat affect. Slumped over in wheelchair, unable to keep head up.  Head:  Normocephalic and atraumatic. Eyes:  Without icterus, sclera clear and conjunctiva pink.  Ears:  Normal auditory acuity. Nose:  No deformity, discharge,  or lesions. Lungs:  Clear to auscultation bilaterally. Heart:  S1, S2 present without murmurs appreciated.  Abdomen:  +BS, soft, non-tender and non-distended. Limited exam with patient in wheelchair Rectal:  Deferred  Msk:  Kyphosis, unable to maintain upright posture, left sided paralysis  Extremities:  Without  edema. Neurologic:  Alert and  oriented x4 to person  Psych:  Flat affect   Lab Results  Component Value Date   WBC 7.2 11/22/2017   HGB 12.1 11/22/2017   HCT 38.1 11/22/2017   MCV 97.4 11/22/2017   PLT 177 11/22/2017   Lab Results  Component Value Date   ALT 8 (L) 11/22/2017   AST 13 (L) 11/22/2017   ALKPHOS 50 11/22/2017   BILITOT 0.6 11/22/2017   Lab Results  Component Value Date   CREATININE 0.65 11/22/2017   BUN 14 11/22/2017   NA 139 11/22/2017   K 3.8 11/22/2017   CL 98 (L) 11/22/2017   CO2 34 (H) 11/22/2017

## 2018-02-28 NOTE — Patient Instructions (Signed)
We are moving forward with a colonoscopy in the future, but we will need to have you admitted the day before to ensure the colon prep is done well.   I would like to have the neurologist provide clearance prior to the colonoscopy.   Further recommendations to follow!  It was a pleasure to see you today. I strive to create trusting relationships with patients to provide genuine, compassionate, and quality care. I value your feedback. If you receive a survey regarding your visit,  I greatly appreciate you taking time to fill this out.   Gelene MinkAnna W. Boone, PhD, ANP-BC [facility]

## 2018-02-28 NOTE — Telephone Encounter (Signed)
Spoke with receptionist at [facility]. Faxing letter for clearance of TCS at [phone] [employer].

## 2018-03-06 NOTE — Telephone Encounter (Signed)
Called [facility]. Refaxed letter Doylene Canning, for clearance per AB. NP, Karie Georges is the provider that has seen pt. They are looking out for the fax.

## 2018-03-07 ENCOUNTER — Telehealth: Payer: Self-pay | Admitting: Gastroenterology

## 2018-03-07 ENCOUNTER — Encounter: Payer: Self-pay | Admitting: Gastroenterology

## 2018-03-07 NOTE — Telephone Encounter (Signed)
Do we have a tentative date on hold for colonoscopy?  What is status of neuro clearance for patient?   Thanks!

## 2018-03-07 NOTE — Assessment & Plan Note (Signed)
62 year old female who suffered a stroke during emergent cerebral aneurysm repair in 2017, multiple other medical  issues, presenting with heme positive stool. Unfortunately, she has become very deconditioned after hospitalizations for UTI and CDI earlier this year. Residing at the [facility] currently and found to be heme positive. No overt GI bleeding. Sister, [name] [phone]) and Joyce's husband are present today as supportive family members. They are desiring colonoscopy as she has never had one and there is a positive family history of colon cancer (father) and sister with multiple colon polyps.   We discussed at length the difficulties of outpatient bowel prep, particularly in a nursing facility. We discussed risks and benefits in detail, and we discussed possibility of monitoring for any overt GI bleeding and holding on colonoscopy until that point. After thorough discussion, family and patient want to pursue a colonoscopy. This will entail admission day prior with dedicated bowel prep by nursing staff. Family is aware of this and still want to pursue this. Due to her complicated history, I would like to get neurological clearance prior to pursuing this. Family is to obtain and should have an appt at [facility] upcoming.  Will arrange colonoscopy with Propofol by Dr. Jena Gauss once neurology clearance obtained. Will need to be admitted day prior with dedicated bowel prep. We will use a split prep and ask that patient do 2 days of clear liquids prior to ensure this is an excellent prep. Awaiting neurology input first.

## 2018-03-07 NOTE — Telephone Encounter (Signed)
T/C from [place] at the [facility] in [place], asking if we have a form for neurology to fill out. I told her no, that they can just fax [place] a note that she is cleared or give [place] a call. She said she will have them give [place] a call.

## 2018-03-07 NOTE — Progress Notes (Signed)
cc'd to pcp 

## 2018-03-07 NOTE — Telephone Encounter (Signed)
I've contacted Dr. Karie Georges, NP at [facility] twice and faxed letter to them. I will call them back.

## 2018-03-07 NOTE — Telephone Encounter (Signed)
Spoke with Inez Catalina and she put a note in the system for Admin and pts Provider. Waiting to hear back from [facility] neurology.

## 2018-03-07 NOTE — Telephone Encounter (Signed)
She has held spot on the schedule for 04/04/18 according to the schedule

## 2018-03-11 NOTE — Telephone Encounter (Signed)
Spoke to the receptionist at [facility]. The fax was received and she's sending the doctor and nurse a phone message.

## 2018-03-11 NOTE — Telephone Encounter (Signed)
Noted. Will await clearance.

## 2018-03-19 NOTE — Telephone Encounter (Signed)
Spoke with Receptionist at [facility]. She took a message for Dr. Yekaterina PalauSheikh's nurse to call me back in reference to pts procedure.   AB I've been calling and the receptionist confirmed that all the messages I sent were in their system.

## 2018-03-26 NOTE — Telephone Encounter (Signed)
AB, received a call from the office of this pt. Pts provider has been out on medical leave and I wasn't told that. They apologized and are going to fax over special instructions for pt and will approve the TCS.

## 2018-03-26 NOTE — Telephone Encounter (Signed)
Noted. Will await fax and further instructions from AB

## 2018-03-29 NOTE — Telephone Encounter (Signed)
Mindy: when is the new slot held?

## 2018-03-29 NOTE — Telephone Encounter (Signed)
10/10 at 1:15pm.

## 2018-04-02 NOTE — Telephone Encounter (Signed)
LMTCB for schedule at [facility]. Called [name] and is aware of date for TCS. Also aware patient will need admission on 04/10/18.

## 2018-04-02 NOTE — Telephone Encounter (Signed)
May go ahead and put on for colonoscopy with Propofol by Dr. Jena Gauss on 10/10.   I have already informed hospitalists of upcoming need for admission next Wednesday to prep. Please arrange with bed control, admissions, that she be there as early as possible in the morning on Wednesday, October 9th. She will need to start her prep as soon as possible. FLow manager [phone] should be called when patient arrives to page the appropriate hospitalist.   Instructions: Needs to do 2 days of clear liquids prior to colonoscopy, starting October 8th. Continue clear liquids October 9th. Will need split prep, starting prep as soon as possible on October 9th in case more prep is needed. Remainder of prep on October 10th at 0500.   As of note, neurologist asking seizure medication to be given with sips of water the morning of the procedure, which we would normally do anyway.   If at all possible, can pre admissions testing be done prior to this admission in case there is anything extra or any concerns.   I am copying Verlon Au on this, as she will be rounding on Wednesday.

## 2018-04-02 NOTE — Progress Notes (Signed)
Received note from [facility] Department of Neurology for clearance for colonoscopy. Specifically requested that she take her seizure medication with sips of water the usual time day before procedure and morning of procedure. If any seizures, lorazepam 1 mg oral prn. Specifications in letter also to have lorazepam 1 mg orally after procedure once returns to facility and as needed daily for the following 2-3 days. Signed by Donnetta Hail, FNP, office:: [phone].

## 2018-04-03 NOTE — Telephone Encounter (Signed)
Lm with [facility] nurse. She will call back per the receptionist.

## 2018-04-03 NOTE — Telephone Encounter (Signed)
[facility] and unable to get anyone to answer the phone.  Called [place] about pre-op. She is going to have to speak with Britta Mccreedy and will call me back to let me know.

## 2018-04-03 NOTE — Telephone Encounter (Addendum)
Lisa [name] called back and stated pre-op can't be done prior to admission since booking for procedure is different. If we are concerned with anything in particular then we could go ahead and order lab work done for patient to have done. AB is out of the office. Please advise LSL thanks   Also spoke with [facility] but scheduler Annabelle Harman was not available. I was provided with fax # [phone] to fax information to [facility]. I have done so and received confirmation it went through.

## 2018-04-03 NOTE — Telephone Encounter (Signed)
forwarding to [place] regarding lab orders. I have faxed note regarding when patient needing to start clear liquids.

## 2018-04-03 NOTE — Telephone Encounter (Signed)
Let's get CBC, basic metabolic panel sometime this week.   AB wants patient to start clear liquids 10/8, please make sure nursing home aware.

## 2018-04-04 ENCOUNTER — Other Ambulatory Visit: Payer: Self-pay

## 2018-04-04 DIAGNOSIS — R195 Other fecal abnormalities: Secondary | ICD-10-CM

## 2018-04-04 NOTE — Telephone Encounter (Signed)
I had a VM from Cicki pickle returning alicia's call regarding blood work for pt. She can be reached at the [facility]

## 2018-04-04 NOTE — Telephone Encounter (Signed)
Spoke with Tresa Endo from the [facility] and confirmed they did receive fax for patient. She will start clear liquids 04/09/18. Admission to [facility] will be on 04/10/18 and TCS is scheduled for 04/11/18.

## 2018-04-04 NOTE — Telephone Encounter (Signed)
Spoke with Chip Boer and she would like lab orders faxed over for blood work. Orders faxed [phone].

## 2018-04-09 NOTE — Telephone Encounter (Signed)
Called bed placement and patient now has pending admission for tomorrow AM at 8:00am.

## 2018-04-09 NOTE — Telephone Encounter (Signed)
I received outside labs dated Apr 08, 2018.   Hgb 8.6, Hct 26.2, MCV elevated at 95.2, platelets 171, Creatinine 0.43, BUN 15.1. Sodium 143, potassium 3.5.   In review of her past labs, Hgb in the 10-11 range in March 2019 while hospitalized for CDI, then normal at 12.1 in May 2019.  She has had a 3 gram drop since May 2019. No overt GI bleeding. Unknown iron studies. Already planned for colonoscopy on April 11, 2018. Will be admitted oct 9th. Will need repeat CBC, BMP on 10/9 at time of admission.

## 2018-04-09 NOTE — Telephone Encounter (Signed)
Noted. Lisa [name] about patient pending admission tomorrow at 8am. She should be on clear liquids today. Prep to be started tomorrow when arrived. Looks like lab orders completed while I was gone, but I don't have results. Please see if this has already been done.

## 2018-04-09 NOTE — Telephone Encounter (Signed)
Spoke with the receptionist at the [facility]. She will have the nurse call back.

## 2018-04-09 NOTE — Telephone Encounter (Signed)
Spoke with PTs nurse. She's faxing lab results today. They will be placed on AB's desk. Pt started clear liquid today.

## 2018-04-10 ENCOUNTER — Observation Stay (HOSPITAL_COMMUNITY): Payer: Medicaid Other

## 2018-04-10 ENCOUNTER — Encounter (HOSPITAL_COMMUNITY): Payer: Self-pay | Admitting: *Deleted

## 2018-04-10 ENCOUNTER — Other Ambulatory Visit: Payer: Self-pay

## 2018-04-10 ENCOUNTER — Inpatient Hospital Stay (HOSPITAL_COMMUNITY)
Admission: RE | Admit: 2018-04-10 | Discharge: 2018-04-13 | DRG: 394 | Disposition: A | Discharge status: Skilled Nursing Facility | Payer: Medicaid Other | Attending: Internal Medicine | Admitting: Internal Medicine

## 2018-04-10 DIAGNOSIS — I959 Hypotension, unspecified: Secondary | ICD-10-CM | POA: Diagnosis not present

## 2018-04-10 DIAGNOSIS — Z800 Family history of malignant neoplasm of digestive organs: Secondary | ICD-10-CM

## 2018-04-10 DIAGNOSIS — G8114 Spastic hemiplegia affecting left nondominant side: Secondary | ICD-10-CM | POA: Diagnosis present

## 2018-04-10 DIAGNOSIS — R0600 Dyspnea, unspecified: Secondary | ICD-10-CM

## 2018-04-10 DIAGNOSIS — Z66 Do not resuscitate: Secondary | ICD-10-CM | POA: Diagnosis present

## 2018-04-10 DIAGNOSIS — I776 Arteritis, unspecified: Secondary | ICD-10-CM | POA: Diagnosis present

## 2018-04-10 DIAGNOSIS — R1031 Right lower quadrant pain: Secondary | ICD-10-CM | POA: Diagnosis not present

## 2018-04-10 DIAGNOSIS — F05 Delirium due to known physiological condition: Secondary | ICD-10-CM | POA: Diagnosis present

## 2018-04-10 DIAGNOSIS — Z7189 Other specified counseling: Secondary | ICD-10-CM

## 2018-04-10 DIAGNOSIS — F4320 Adjustment disorder, unspecified: Secondary | ICD-10-CM | POA: Diagnosis present

## 2018-04-10 DIAGNOSIS — K661 Hemoperitoneum: Principal | ICD-10-CM | POA: Diagnosis present

## 2018-04-10 DIAGNOSIS — I69353 Hemiplegia and hemiparesis following cerebral infarction affecting right non-dominant side: Secondary | ICD-10-CM

## 2018-04-10 DIAGNOSIS — F0150 Vascular dementia without behavioral disturbance: Secondary | ICD-10-CM | POA: Diagnosis present

## 2018-04-10 DIAGNOSIS — R1312 Dysphagia, oropharyngeal phase: Secondary | ICD-10-CM | POA: Diagnosis present

## 2018-04-10 DIAGNOSIS — R58 Hemorrhage, not elsewhere classified: Secondary | ICD-10-CM

## 2018-04-10 DIAGNOSIS — K5900 Constipation, unspecified: Secondary | ICD-10-CM | POA: Diagnosis present

## 2018-04-10 DIAGNOSIS — G40909 Epilepsy, unspecified, not intractable, without status epilepticus: Secondary | ICD-10-CM

## 2018-04-10 DIAGNOSIS — M24572 Contracture, left ankle: Secondary | ICD-10-CM | POA: Diagnosis present

## 2018-04-10 DIAGNOSIS — Z7401 Bed confinement status: Secondary | ICD-10-CM

## 2018-04-10 DIAGNOSIS — Z7982 Long term (current) use of aspirin: Secondary | ICD-10-CM

## 2018-04-10 DIAGNOSIS — Z8249 Family history of ischemic heart disease and other diseases of the circulatory system: Secondary | ICD-10-CM

## 2018-04-10 DIAGNOSIS — D62 Acute posthemorrhagic anemia: Secondary | ICD-10-CM | POA: Diagnosis not present

## 2018-04-10 DIAGNOSIS — I10 Essential (primary) hypertension: Secondary | ICD-10-CM | POA: Diagnosis present

## 2018-04-10 DIAGNOSIS — E785 Hyperlipidemia, unspecified: Secondary | ICD-10-CM | POA: Diagnosis present

## 2018-04-10 DIAGNOSIS — J9811 Atelectasis: Secondary | ICD-10-CM | POA: Diagnosis present

## 2018-04-10 DIAGNOSIS — Z79891 Long term (current) use of opiate analgesic: Secondary | ICD-10-CM

## 2018-04-10 DIAGNOSIS — Z515 Encounter for palliative care: Secondary | ICD-10-CM

## 2018-04-10 DIAGNOSIS — Z79899 Other long term (current) drug therapy: Secondary | ICD-10-CM

## 2018-04-10 DIAGNOSIS — R4701 Aphasia: Secondary | ICD-10-CM | POA: Diagnosis present

## 2018-04-10 DIAGNOSIS — I6010 Nontraumatic subarachnoid hemorrhage from unspecified middle cerebral artery: Secondary | ICD-10-CM | POA: Diagnosis present

## 2018-04-10 LAB — URINALYSIS, COMPLETE (UACMP) WITH MICROSCOPIC
BACTERIA UA: NONE SEEN
BILIRUBIN URINE: NEGATIVE
Glucose, UA: NEGATIVE mg/dL
Ketones, ur: 5 mg/dL — AB
LEUKOCYTES UA: NEGATIVE
Nitrite: NEGATIVE
PH: 6 (ref 5.0–8.0)
Protein, ur: NEGATIVE mg/dL
Specific Gravity, Urine: 1.018 (ref 1.005–1.030)

## 2018-04-10 LAB — BASIC METABOLIC PANEL
Anion gap: 13 (ref 5–15)
BUN: 19 mg/dL (ref 8–23)
CO2: 25 mmol/L (ref 22–32)
Calcium: 8.5 mg/dL — ABNORMAL LOW (ref 8.9–10.3)
Chloride: 101 mmol/L (ref 98–111)
Creatinine, Ser: 0.64 mg/dL (ref 0.44–1.00)
GFR calc non Af Amer: >60 mL/min (ref >60)
GLUCOSE: 105 mg/dL — AB (ref 70–99)
Potassium: 3.6 mmol/L (ref 3.5–5.1)
SODIUM: 139 mmol/L (ref 135–145)

## 2018-04-10 LAB — CBC
HCT: 32.7 % — ABNORMAL LOW (ref 36.0–46.0)
Hemoglobin: 9.6 g/dL — ABNORMAL LOW (ref 12.0–15.0)
MCH: 29.8 pg (ref 26.0–34.0)
MCHC: 29.4 g/dL — AB (ref 30.0–36.0)
MCV: 101.6 fL — ABNORMAL HIGH (ref 80.0–100.0)
PLATELETS: 245 10*3/uL (ref 150–400)
RBC: 3.22 MIL/uL — AB (ref 3.87–5.11)
RDW: 14.6 % (ref 11.5–15.5)
WBC: 9.1 10*3/uL (ref 4.0–10.5)
nRBC: 0 % (ref 0.0–0.2)

## 2018-04-10 LAB — IRON AND TIBC
Iron: 34 ug/dL (ref 28–170)
Saturation Ratios: 13 % (ref 10.4–31.8)
TIBC: 269 ug/dL (ref 250–450)
UIBC: 235 ug/dL

## 2018-04-10 LAB — PROTIME-INR
INR: 1.03
PROTHROMBIN TIME: 13.4 s (ref 11.4–15.2)

## 2018-04-10 LAB — CORTISOL-AM, BLOOD: CORTISOL - AM: 29.4 ug/dL — AB (ref 6.7–22.6)

## 2018-04-10 LAB — LACTIC ACID, PLASMA
LACTIC ACID, VENOUS: 2.6 mmol/L — AB (ref 0.5–1.9)
Lactic Acid, Venous: 1.5 mmol/L (ref 0.5–1.9)

## 2018-04-10 LAB — FERRITIN: FERRITIN: 255 ng/mL (ref 11–307)

## 2018-04-10 LAB — APTT: APTT: 32 s (ref 24–36)

## 2018-04-10 LAB — PROCALCITONIN: Procalcitonin: <0.1 ng/mL

## 2018-04-10 MED ORDER — ONDANSETRON HCL 4 MG PO TABS: 4.0000 mg | ORAL_TABLET | Freq: Four times a day (QID) | ORAL | Status: DC | PRN

## 2018-04-10 MED ORDER — IOPAMIDOL (ISOVUE-300) INJECTION 61%
100.0000 mL | Freq: Once | INTRAVENOUS | Status: AC | PRN
  Administered 2018-04-10: 100 mL via INTRAVENOUS

## 2018-04-10 MED ORDER — ACETAMINOPHEN 650 MG RE SUPP: 650.0000 mg | Freq: Four times a day (QID) | RECTAL | Status: DC | PRN

## 2018-04-10 MED ORDER — SODIUM CHLORIDE 0.9 % IV BOLUS
1000.0000 mL | Freq: Once | INTRAVENOUS | Status: AC
  Administered 2018-04-10: 1000 mL via INTRAVENOUS

## 2018-04-10 MED ORDER — POTASSIUM CHLORIDE IN NACL 20-0.9 MEQ/L-% IV SOLN
INTRAVENOUS | Status: DC
  Administered 2018-04-10 – 2018-04-13 (×4): via INTRAVENOUS

## 2018-04-10 MED ORDER — POLYETHYLENE GLYCOL 3350 17 G PO PACK
17.0000 g | PACK | Freq: Two times a day (BID) | ORAL | Status: DC
  Administered 2018-04-10 – 2018-04-12 (×5): 17 g via ORAL
  Filled 2018-04-10 (×5): qty 1

## 2018-04-10 MED ORDER — LORAZEPAM 2 MG/ML IJ SOLN
1.0000 mg | INTRAMUSCULAR | Status: DC | PRN
  Administered 2018-04-11: 1 mg via INTRAVENOUS
  Filled 2018-04-10: qty 1

## 2018-04-10 MED ORDER — MILK AND MOLASSES ENEMA
2.0000 | Freq: Once | RECTAL | Status: AC
  Administered 2018-04-10: 500 mL via RECTAL

## 2018-04-10 MED ORDER — ACETAMINOPHEN 325 MG PO TABS: 650.0000 mg | ORAL_TABLET | Freq: Four times a day (QID) | ORAL | Status: DC | PRN

## 2018-04-10 MED ORDER — ONDANSETRON HCL 4 MG/2ML IJ SOLN: 4.0000 mg | Freq: Four times a day (QID) | INTRAMUSCULAR | Status: DC | PRN

## 2018-04-10 MED ORDER — DIVALPROEX SODIUM 250 MG PO DR TAB
750.0000 mg | DELAYED_RELEASE_TABLET | Freq: Two times a day (BID) | ORAL | Status: DC
  Administered 2018-04-10 – 2018-04-13 (×7): 750 mg via ORAL
  Filled 2018-04-10 (×7): qty 3

## 2018-04-10 MED ORDER — SODIUM CHLORIDE 0.9 % IV BOLUS
500.0000 mL | Freq: Once | INTRAVENOUS | Status: AC
  Administered 2018-04-10: 500 mL via INTRAVENOUS

## 2018-04-10 NOTE — Progress Notes (Signed)
CT abdomen completed. Results called to Dr. Jena Gauss per CT report. Text-paged Dr. Arbutus Leas to notify him as well. Earnstine Regal, RN

## 2018-04-10 NOTE — Progress Notes (Signed)
Second milk of molasses enema given as ordered. Patient had large amount of stool prior to second enema. Immediate results of large amount of stool after enema. Tolerated well with no complaints. Tolerated clear liquids well for supper. purewick has been in place for I&O due to urinary incontinence but patient has been incontinent around purewick. Unable to measure output except by occurrences.  Earnstine Regal, RN

## 2018-04-10 NOTE — Progress Notes (Signed)
Received call from Dr. Jena Gauss regarding CT abdomen results. Stated he would like patient to receive 2 milk of molasses enemas due to large amount of stool in rectum. He is aware patient was hypotensive on admission and BP stable after boluses as ordered. Pt remains sleepy but easily arousable and alert when awake. States nursing should continue to monitor BP and for signs of hypovolemia/shock. Earnstine Regal, RN

## 2018-04-10 NOTE — H&P (Signed)
History and Physical  Lisa [name] ZOX:[id number] DOB: March 05, 1956 DOA: 04/10/2018   PCP: Audree Bane, DO   Patient coming from: Home  Chief Complaint: heme positive stool, low Hgb  HPI:  Lisa [name] is a 62 y.o. 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  The patient was seen as an outpatient at [facility] on 02/27/2018 for heme positive stool.  The patient has been more deconditioned since suffering sepsis from E. coli bacteremia and pyelonephritis with resultant CDI in March 2019.  She was noted to have heme positive stools and sent to gastroenterology.  Her hemoglobin was noted to be 11.3 on 09/28/2017.  On 11/22/2017, the patient's hemoglobin was 12.1.  Since that time, the patient had her hemoglobin rechecked on 04/08/2018 and it was noted to be 8.6.  As result, the risks, benefits, and alternatives were discussed with the patient regarding colonoscopy, and the patient's sister want to pursue colonoscopy.  Because of the patient's comorbidities, it was felt that the patient will be better served being admitted to the hospital for preparation and the colonoscopy itself.  Apparently, the patient has been following neurology at [facility].  She has been cleared by neurology for colonoscopy.  Assessment/Plan: Acute blood loss anemia/Hemoccult positive stool -GI consulted--planning for colonoscopy -Check CBC -Check coags  Hypotension -am cortisol -IVF -CBC -lactic acid -procalcitonin -fluid bolus and start maintenance IVF -blood cultures x 2 sets -CXR  Seizure  disorder -Continue Depakote  Essential hypertension -no longer on BP meds -work up for hypotension as above  Right hemiplegia--residual effect of stroke -PT evaluation -holding aspirin for GI work up  Vascular Dementia -at risk for hospital delirium       Past Medical History:  Diagnosis Date  . Abnormal posture   . Adjustment disorder   . Anemia, unspecified   . Aphasia-angular gyrus syndrome   . Arthritis, climacteric, knee, left   . Atypical psychosis (HCC)   . C. difficile diarrhea   . Cerebral arteritis   . Contracture of left ankle   . Essential hypertension, malignant   . Essential hypertension, malignant   . Hyperlipemia   . Left spastic hemiplegia (HCC)   . Muscle weakness (generalized)   . 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 Oropharyngeal dysphagia   . Other sequelae following nontraumatic subarachnoid hemorrhage   . Primary osteoarthritis of both knees   . Screening for thyroid disorder   . Screening for thyroid disorder   . Seizures (HCC)   . Sequelae of cerebral infarction   . Unsteady   . Vascular dementia with delirium    Past Surgical History:  Procedure Laterality Date  . CEREBRAL ANEURYSM REPAIR     2017? then suffered stroke   Social History:  reports that she has never smoked. She has never used smokeless tobacco. She reports that she does not drink alcohol or use drugs.   Family History  Problem Relation Age of Onset  . Hypertension Other   . Colon cancer Father        diagnosed in his 9s   . Colon polyps Sister      No Known Allergies   Prior to Admission medications  Medication Sig Start Date End Date Taking? Authorizing Provider  aspirin EC 81 MG tablet Take 1 tablet (81 mg total) by mouth daily. 09/28/17   Catarina Hartshorn, MD  divalproex (DEPAKOTE) 250 MG DR tablet Take 750 mg by mouth 2 (two) times daily.     [provider]  Ethelda Chick (OYSTER CALCIUM) 500 MG TABS tablet  Take 500 mg of elemental calcium by mouth daily.    [provider]  UNABLE TO FIND House 2.0 2 times a day for supplemental/caloric intake    [provider]    Review of Systems:  Unobtainable due to patient's dementia Physical Exam: Vitals:   04/10/18 0850 04/10/18 0851 04/10/18 0853 04/10/18 0916  BP:  (!) 63/44  (!) 84/58  Pulse:  72    Resp:  16    Temp:  97.6 F (36.4 C)    TempSrc:  Oral    SpO2:  99% 99%   Weight: 56.9 kg     Height: 5\' 4"  (1.626 m)      General:  A&O x 2, NAD, nontoxic, pleasant/cooperative Head/Eye: No conjunctival hemorrhage, no icterus, [place]/AT, No nystagmus ENT:  No icterus,  No thrush, good dentition, no pharyngeal exudate Neck:  No masses, no lymphadenpathy, no bruits CV:  RRR, no rub, no gallop, no S3 Lung:  CTAB, good air movement, no wheeze, no rhonchi Abdomen: soft/RUQ abd pain, +BS, nondistended, no peritoneal signs Ext: No cyanosis, No rashes, No petechiae, No lymphangitis, trace LE edema Neuro: CNII-XII intact, strength 4/5 in bilateral upper and lower extremities, no dysmetria  Labs on Admission:  Basic Metabolic Panel: No results for input(s): NA, K, CL, CO2, GLUCOSE, BUN, CREATININE, CALCIUM, MG, PHOS in the last 168 hours. Liver Function Tests: No results for input(s): AST, ALT, ALKPHOS, BILITOT, PROT, ALBUMIN in the last 168 hours. No results for input(s): LIPASE, AMYLASE in the last 168 hours. No results for input(s): AMMONIA in the last 168 hours. CBC: No results for input(s): WBC, NEUTROABS, HGB, HCT, MCV, PLT in the last 168 hours. Coagulation Profile: No results for input(s): INR, PROTIME in the last 168 hours. Cardiac Enzymes: No results for input(s): CKTOTAL, CKMB, CKMBINDEX, TROPONINI in the last 168 hours. BNP: Invalid input(s): POCBNP CBG: No results for input(s): GLUCAP in the last 168 hours. Urine analysis:    Component Value Date/Time   COLORURINE COLORLESS (A) 11/22/2017 1710   APPEARANCEUR  CLEAR 11/22/2017 1710   LABSPEC 1.006 11/22/2017 1710   PHURINE 8.0 11/22/2017 1710   GLUCOSEU NEGATIVE 11/22/2017 1710   HGBUR MODERATE (A) 11/22/2017 1710   BILIRUBINUR NEGATIVE 11/22/2017 1710   KETONESUR NEGATIVE 11/22/2017 1710   PROTEINUR NEGATIVE 11/22/2017 1710   NITRITE NEGATIVE 11/22/2017 1710   LEUKOCYTESUR NEGATIVE 11/22/2017 1710   Sepsis Labs: @LABRCNTIP (procalcitonin:4,lacticidven:4) )No results found for this or any previous visit (from the past 240 hour(s)).   Radiological Exams on Admission: No results found.      Time spent:60 minutes Code Status:   FULL Family Communication:  No Family at bedside Disposition Plan: expect 1-2 day hospitalization Consults called: [facility] DVT Prophylaxis: SCDs  Catarina Hartshorn, DO  [facility] Pager [phone]  If 7PM-7AM, please contact night-coverage [email or web address] Password [email or web address] 04/10/2018, 9:17 AM

## 2018-04-10 NOTE — Progress Notes (Signed)
Patient drank half of first bottle of oral contrast, unable to tolerate more due to falling asleep while drinking contrast. Notified Jennifer in CT. Stated okay. Earnstine Regal, RN

## 2018-04-10 NOTE — Progress Notes (Signed)
CRITICAL VALUE ALERT  Critical Value:  Lactic acid 2.6  Date & Time Notied:  04/10/18 1030  Provider Notified: Text paged Dr. Arbutus Leas.   Orders Received/Actions taken:

## 2018-04-10 NOTE — Progress Notes (Signed)
Patient had first milk of molasses enema as ordered. Very large amount of soft brown stool resulted. Patient stated "my stomach doesn't hurt anymore." tolerated miralax well. Patient requested not to have second enema if possible. Discussed with Dr. Arbutus Leas since Dr. Jena Gauss is not on call at this time. Stated he would agree with second enema as ordered. Earnstine Regal, RN

## 2018-04-10 NOTE — Telephone Encounter (Signed)
Noted  

## 2018-04-10 NOTE — Consult Note (Addendum)
Referring Provider: Catarina Hartshorn, MD Primary Care Physician:  Audree Bane, DO Primary Gastroenterologist:  Roetta Sessions, MD  Reason for Consultation:  Anemia, heme positive stool  HPI: Lisa [name] is a 62 y.o. 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  History very limited.  Patient very deconditioned from UTI and C. difficile earlier this year, unable to sit in a wheelchair as she previously did.  She is had some vague upper abdominal pain.  Her neurologist is in [place].  Family desiring colonoscopy.  Father diagnosed with colon cancer in his 63s, then suffered a reoccurrence and subsequently died in his late 82s.  Sister with multiple polyps.  No noted rectal bleeding.  [facility] Department of neurology provided clearance for colonoscopy but requesting that she take seizure medication with sips of water the usual time of day before procedure in the morning of the procedure.  If any seizures develop, recommend lorazepam 1 mg oral as needed.   Specifications in letter also to have lorazepam 1 mg orally after procedure once returns to facility and as needed daily for the following 2-3 days. Signed by Donnetta Hail, FNP, office:: [phone].   Patient had labs last week at the facility, hemoglobin 8.6 down from 12.1 in May. Presented this morning to the hospital for overnight stay to assist with bowel preparation due to severe mobility restrictions. She resides at [facility] in [place], [place]. She was on clear liquids all day yesterday in preparation for colonoscopy. Patient reports RLQ pain for several days. She reports BM, loose once daily. No melena, brbpr. She denies heartburn, vomiting, dysphagia. In the office back in 01/2018 she reported upper abdominal pain,  vague in description.   Prior to Admission medications   Medication Sig Start Date End Date Taking? Authorizing Provider  aspirin EC 81 MG tablet Take 1 tablet (81 mg total) by mouth daily. 09/28/17   Catarina Hartshorn, MD  divalproex (DEPAKOTE) 250 MG DR tablet Take 750 mg by mouth 2 (two) times daily.     [provider]  Ethelda Chick (OYSTER CALCIUM) 500 MG TABS tablet Take 500 mg of elemental calcium by mouth daily.    [provider]  UNABLE TO FIND House 2.0 2 times a day for supplemental/caloric intake    [provider]    Current Facility-Administered Medications  Medication Dose Route Frequency Provider Last Rate Last Dose  . 0.9 % NaCl with KCl 20 mEq/ L  infusion   Intravenous Continuous Tat, David, MD      . acetaminophen (TYLENOL) tablet 650 mg  650 mg Oral Q6H PRN Tat, Onalee Hua, MD       Or  . acetaminophen (TYLENOL) suppository 650 mg  650 mg Rectal Q6H PRN Tat, Onalee Hua, MD      . divalproex (DEPAKOTE) DR tablet 750 mg  750 mg Oral BID Tat, David, MD      . LORazepam (ATIVAN) injection 1 mg  1 mg Intravenous Q4H PRN Tat, Onalee Hua, MD      . ondansetron (ZOFRAN) tablet 4 mg  4 mg Oral Q6H PRN Tat, David, MD       Or  . ondansetron (ZOFRAN) injection 4 mg  4 mg Intravenous Q6H PRN Tat, David, MD      . sodium chloride 0.9 % bolus 1,000 mL  1,000  mL Intravenous Once Catarina Hartshorn, MD 983.6 mL/hr at 04/10/18 0936 1,000 mL at 04/10/18 0936    Allergies as of 04/09/2018  . (No Known Allergies)    Past Medical History:  Diagnosis Date  . Abnormal posture   . Adjustment disorder   . Anemia, unspecified   . Aphasia-angular gyrus syndrome   . Arthritis, climacteric, knee, left   . Atypical psychosis (HCC)   . C. difficile diarrhea   . Cerebral arteritis   . Contracture of left ankle   . Essential hypertension, malignant   . Essential hypertension, malignant   . Hyperlipemia   . Left spastic hemiplegia (HCC)   . Muscle weakness (generalized)   . Nontraumatic  subarachnoid hemorrhage from middle cerebral artery (HCC)   . Oropharyngeal dysphagia   . Other sequelae following nontraumatic subarachnoid hemorrhage   . Primary osteoarthritis of both knees   . Screening for thyroid disorder   . Screening for thyroid disorder   . Seizures (HCC)   . Sequelae of cerebral infarction   . Unsteady   . Vascular dementia with delirium     Past Surgical History:  Procedure Laterality Date  . CEREBRAL ANEURYSM REPAIR     2017? then suffered stroke    Family History  Problem Relation Age of Onset  . Hypertension Other   . Colon cancer Father        diagnosed in his 68s   . Colon polyps Sister     Social History   Socioeconomic History  . Marital status: Single    Spouse name: Not on file  . Number of children: Not on file  . Years of education: Not on file  . Highest education level: Not on file  Occupational History  . Not on file  Social Needs  . Financial resource strain: Not on file  . Food insecurity:    Worry: Not on file    Inability: Not on file  . Transportation needs:    Medical: Not on file    Non-medical: Not on file  Tobacco Use  . Smoking status: Never Smoker  . Smokeless tobacco: Never Used  Substance and Sexual Activity  . Alcohol use: No    Frequency: Never  . Drug use: No  . Sexual activity: Not on file  Lifestyle  . Physical activity:    Days per week: Not on file    Minutes per session: Not on file  . Stress: Not on file  Relationships  . Social connections:    Talks on phone: Not on file    Gets together: Not on file    Attends religious service: Not on file    Active member of club or organization: Not on file    Attends meetings of clubs or organizations: Not on file    Relationship status: Not on file  . Intimate partner violence:    Fear of current or ex partner: Not on file    Emotionally abused: Not on file    Physically abused: Not on file    Forced sexual activity: Not on file  Other Topics  Concern  . Not on file  Social History Narrative  . Not on file     ROS: limited history from patient due to cognitive status. See hpi.   Physical Examination: Vital signs in last 24 hours: Temp:  [97.6 F (36.4 C)] 97.6 F (36.4 C) (10/09 0851) Pulse Rate:  [72] 72 (10/09 0851) Resp:  [16] 16 (10/09 0851) BP: (  63-84)/(44-58) 84/58 (10/09 0916) SpO2:  [99 %] 99 % (10/09 0853) Weight:  [56.9 kg] 56.9 kg (10/09 0850) Last BM Date: 04/10/18  General: alert and oriented. Flat affect. Limited verbal communication. reports she resides at private home but she resides at nursing facility.   Head: Normocephalic, atraumatic.   Eyes: Conjunctiva pale, no icterus. Mouth: Oropharyngeal mucosa moist and pink , no lesions erythema or exudate. Neck: Supple without thyromegaly, masses, or lymphadenopathy.  Lungs: Clear to auscultation bilaterally.  Heart: Regular rate and rhythm, no murmurs rubs or gallops.  Abdomen: Bowel sounds are normal, RLQ tenderness with subjective guarding, nondistended, no hepatosplenomegaly or masses, no abdominal bruits or hernia    Rectal: not performed Extremities: No lower extremity edema, clubbing, deformity.  Neuro: Alert and oriented x 4 , grossly normal neurologically.  Skin: Warm and dry, no rash or jaundice.   Psych: Alert and cooperative, normal mood and affect.        Intake/Output from previous day: No intake/output data recorded. Intake/Output this shift: No intake/output data recorded.  Lab Results: CBC Recent Labs    04/10/18 0902  WBC 9.1  HGB 9.6*  HCT 32.7*  MCV 101.6*  PLT 245   BMET Recent Labs    04/10/18 0902  NA 139  K 3.6  CL 101  CO2 25  GLUCOSE 105*  BUN 19  CREATININE 0.64  CALCIUM 8.5*   LFT No results for input(s): BILITOT, BILIDIR, IBILI, ALKPHOS, AST, ALT, PROT, ALBUMIN in the last 72 hours.  Lipase No results for input(s): LIPASE in the last 72 hours.  PT/INR Recent Labs    04/10/18 0905  LABPROT 13.4   INR 1.03      Imaging Studies: No results found.Pierre.Alas week]   Impression: 62 year old female presenting to the hospital for overnight stay to facilitate bowel preparation for colonoscopy being done for anemia, Hemoccult positive stool.  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  She has limited mobility when seen in the office a few weeks back is having great difficulty even sitting up in a wheelchair.  It was felt that she would need significant assistance with bowel preparation therefore she was brought into the hospital today.  She is noted to have significant hypotension, several weeks back had a normal blood pressure.  Hemoglobin slightly improved today as compared to last week although overall down from May.  White blood cell count normal.  Work-up of hypotension being managed by attending.  On exam today she has significant right lower quadrant tenderness.  May benefit from CT imaging.  Plan: 1. Continue clear liquids.  2. Fluid bolus/maintenance IVFs, labs, cxr pending due to hypotension. 3. Await pending labs.  4. Will discuss with Dr. Jena Gauss, consider CT to evaluate RLQ tenderness prior to bowel preparation.  5. Continue depakote, instructions not to miss dose per neurology. [facility] Department of neurology provided clearance for colonoscopy but requesting that she take seizure medication with sips of water the usual time of day before procedure in the morning of the procedure.  If any seizures develop, recommend lorazepam 1 mg oral as needed.   Specifications in letter also to have lorazepam 1 mg orally after procedure once returns to facility and as needed daily for the following 2-3 days. Signed by Donnetta Hail, FNP, office:: [phone].   We would like to thank you for the opportunity to participate  in the care of [name].  Leanna Battles. Dixon Boos [facility] [phone] 10/9/201910:10 AM     LOS: 1 day   Attending note:  We will hold off on colonoscopy prep/procedure for the time being.  Further evaluation of hypotension and right sided abdominal pain needed.  Agree with pursuing CT.  Further recommendations to follow.  Additional addendum: Reviewed CT with Dr. Pia Mau.  Patient has a rather significant right retroperitoneal hemorrhage-etiology not clear from CT images.  No gross vascular abnormality.  Hemorrhage may amount to at least 1 to 2 units.  No obvious bruising and no history of trauma. Large stool burden in the rectum/rectosigmoid segment as well.  Recommendations: Cancel plans for colonoscopy this admission.  Supportive measures.  Stop antiplatelet therapy in the way of aspirin.  Twice daily MiraLAX and milk and molasses enemas x2 this afternoon.  Discussed with nursing staff.  We will continue to follow with you.

## 2018-04-11 ENCOUNTER — Encounter (HOSPITAL_COMMUNITY): Payer: Self-pay | Admitting: *Deleted

## 2018-04-11 ENCOUNTER — Telehealth: Payer: Self-pay | Admitting: Gastroenterology

## 2018-04-11 DIAGNOSIS — G8114 Spastic hemiplegia affecting left nondominant side: Secondary | ICD-10-CM | POA: Diagnosis not present

## 2018-04-11 DIAGNOSIS — Z800 Family history of malignant neoplasm of digestive organs: Secondary | ICD-10-CM | POA: Diagnosis not present

## 2018-04-11 DIAGNOSIS — R4701 Aphasia: Secondary | ICD-10-CM | POA: Diagnosis present

## 2018-04-11 DIAGNOSIS — F4320 Adjustment disorder, unspecified: Secondary | ICD-10-CM | POA: Diagnosis present

## 2018-04-11 DIAGNOSIS — Z515 Encounter for palliative care: Secondary | ICD-10-CM | POA: Diagnosis not present

## 2018-04-11 DIAGNOSIS — I6010 Nontraumatic subarachnoid hemorrhage from unspecified middle cerebral artery: Secondary | ICD-10-CM

## 2018-04-11 DIAGNOSIS — J9811 Atelectasis: Secondary | ICD-10-CM | POA: Diagnosis present

## 2018-04-11 DIAGNOSIS — R58 Hemorrhage, not elsewhere classified: Secondary | ICD-10-CM | POA: Diagnosis not present

## 2018-04-11 DIAGNOSIS — F0150 Vascular dementia without behavioral disturbance: Secondary | ICD-10-CM | POA: Diagnosis present

## 2018-04-11 DIAGNOSIS — I9589 Other hypotension: Secondary | ICD-10-CM | POA: Diagnosis not present

## 2018-04-11 DIAGNOSIS — Z7401 Bed confinement status: Secondary | ICD-10-CM | POA: Diagnosis not present

## 2018-04-11 DIAGNOSIS — Z7982 Long term (current) use of aspirin: Secondary | ICD-10-CM | POA: Diagnosis not present

## 2018-04-11 DIAGNOSIS — Z79891 Long term (current) use of opiate analgesic: Secondary | ICD-10-CM | POA: Diagnosis not present

## 2018-04-11 DIAGNOSIS — E785 Hyperlipidemia, unspecified: Secondary | ICD-10-CM | POA: Diagnosis present

## 2018-04-11 DIAGNOSIS — Z66 Do not resuscitate: Secondary | ICD-10-CM | POA: Diagnosis present

## 2018-04-11 DIAGNOSIS — M24572 Contracture, left ankle: Secondary | ICD-10-CM | POA: Diagnosis present

## 2018-04-11 DIAGNOSIS — Z8249 Family history of ischemic heart disease and other diseases of the circulatory system: Secondary | ICD-10-CM | POA: Diagnosis not present

## 2018-04-11 DIAGNOSIS — I776 Arteritis, unspecified: Secondary | ICD-10-CM | POA: Diagnosis present

## 2018-04-11 DIAGNOSIS — D62 Acute posthemorrhagic anemia: Secondary | ICD-10-CM | POA: Diagnosis present

## 2018-04-11 DIAGNOSIS — G40909 Epilepsy, unspecified, not intractable, without status epilepticus: Secondary | ICD-10-CM | POA: Diagnosis present

## 2018-04-11 DIAGNOSIS — I10 Essential (primary) hypertension: Secondary | ICD-10-CM | POA: Diagnosis present

## 2018-04-11 DIAGNOSIS — Z79899 Other long term (current) drug therapy: Secondary | ICD-10-CM | POA: Diagnosis not present

## 2018-04-11 DIAGNOSIS — I959 Hypotension, unspecified: Secondary | ICD-10-CM | POA: Diagnosis present

## 2018-04-11 DIAGNOSIS — I69353 Hemiplegia and hemiparesis following cerebral infarction affecting right non-dominant side: Secondary | ICD-10-CM | POA: Diagnosis not present

## 2018-04-11 DIAGNOSIS — F05 Delirium due to known physiological condition: Secondary | ICD-10-CM | POA: Diagnosis present

## 2018-04-11 DIAGNOSIS — K661 Hemoperitoneum: Principal | ICD-10-CM

## 2018-04-11 DIAGNOSIS — Z7189 Other specified counseling: Secondary | ICD-10-CM

## 2018-04-11 DIAGNOSIS — R195 Other fecal abnormalities: Secondary | ICD-10-CM

## 2018-04-11 DIAGNOSIS — R1312 Dysphagia, oropharyngeal phase: Secondary | ICD-10-CM | POA: Diagnosis present

## 2018-04-11 DIAGNOSIS — K683 Retroperitoneal hematoma: Secondary | ICD-10-CM

## 2018-04-11 DIAGNOSIS — K5900 Constipation, unspecified: Secondary | ICD-10-CM | POA: Diagnosis present

## 2018-04-11 LAB — CBC
HEMATOCRIT: 28.2 % — AB (ref 36.0–46.0)
HEMOGLOBIN: 8.4 g/dL — AB (ref 12.0–15.0)
MCH: 30.4 pg (ref 26.0–34.0)
MCHC: 29.8 g/dL — AB (ref 30.0–36.0)
MCV: 102.2 fL — AB (ref 80.0–100.0)
Platelets: 193 10*3/uL (ref 150–400)
RBC: 2.76 MIL/uL — ABNORMAL LOW (ref 3.87–5.11)
RDW: 14.6 % (ref 11.5–15.5)
WBC: 6 10*3/uL (ref 4.0–10.5)
nRBC: 0 % (ref 0.0–0.2)

## 2018-04-11 LAB — BASIC METABOLIC PANEL
Anion gap: 7 (ref 5–15)
BUN: 11 mg/dL (ref 8–23)
CHLORIDE: 110 mmol/L (ref 98–111)
CO2: 26 mmol/L (ref 22–32)
CREATININE: 0.38 mg/dL — AB (ref 0.44–1.00)
Calcium: 7.8 mg/dL — ABNORMAL LOW (ref 8.9–10.3)
GFR calc Af Amer: >60 mL/min (ref >60)
GFR calc non Af Amer: >60 mL/min (ref >60)
Glucose, Bld: 79 mg/dL (ref 70–99)
POTASSIUM: 3.6 mmol/L (ref 3.5–5.1)
Sodium: 143 mmol/L (ref 135–145)

## 2018-04-11 LAB — HIV ANTIBODY (ROUTINE TESTING W REFLEX): HIV SCREEN 4TH GENERATION: NONREACTIVE

## 2018-04-11 LAB — PROCALCITONIN: Procalcitonin: <0.1 ng/mL

## 2018-04-11 LAB — VALPROIC ACID LEVEL: Valproic Acid Lvl: 96 ug/mL (ref 50.0–100.0)

## 2018-04-11 MED ORDER — LEVETIRACETAM IN NACL 500 MG/100ML IV SOLN
500.0000 mg | Freq: Two times a day (BID) | INTRAVENOUS | Status: DC
  Administered 2018-04-11 – 2018-04-13 (×4): 500 mg via INTRAVENOUS
  Filled 2018-04-11 (×4): qty 100

## 2018-04-11 MED ORDER — LORAZEPAM 2 MG/ML IJ SOLN: 1.0000 mg | Freq: Once | INTRAMUSCULAR | Status: AC

## 2018-04-11 NOTE — Consult Note (Signed)
Consultation Note Date: 04/11/2018   Patient Name: Lisa [name]  DOB: 12/05/1955  MRN: [id number]  Age / Sex: 62 y.o., female  PCP: Lisa Bane, DO Referring Physician: Catarina Hartshorn, MD  Reason for Consultation: Establishing goals of care  HPI/Patient Profile: 62 y.o. female  with past medical history of contracture, bedbound status, nontraumatic subarachnoid hemorrhage left spastic hemiplegia, high blood pressure and cholesterol, history of C. difficile diarrhea, atypical psychosis, adjustment disorder, seizure disorder, vascular dementia admitted on 04/10/2018 with acute blood loss anemia/retroperitoneal bleed.   Clinical Assessment and Goals of Care: Lisa [name] is resting quietly in bed.  She appears acutely/chronically ill and frail.  She makes and mostly keeps eye contact, is able to make her most basic needs known.  She is oriented to self only at this time, there is no family at bedside at this time.   Call to sister, Lisa [name] at [phone]. Lives in [place].  Lisa Bene states that her sister came in for a colonoscopy.  I share that we did a noninvasive imaging first, which showed this retroperitoneal bleed.  I said when people have this kind of problem, it is safe for to not do invasive procedures.  Lisa [name] states understanding, agreement.  We talked about retroperitoneal bleed.  I review recommendations from general surgery.   Sister Lisa Bene states that a couple of weeks ago they took Lisa [name] to [facility] for follow up from brain coil procedure s/p aneurysm.  Lisa Bene states that this testing was done through her groin.  We discuss labs in detail, including hemoglobin stabilized at close to 9.  I share that most people do not receive transfusion until around 7.  Lisa Bene states Lisa [name] would take blood if needed.   Lisa Bene shares that Lisa [name] has been at [facility] for 2 years.  She had rehab after her stroke/aneurysm,  but has had left hemiplegia and she is left-hand dominant.  Lisa Bene shares that initially Lisa [name] could not even sit up, but after rehab she is now able to sit in a wheelchair, even though she is unable to move herself.  We talked about CODE STATUS, Lisa Bene states that Lisa [name] is no CPR/no intubation.  Orders adjusted.  Plan for family meeting 10/11 at 10 AM for further updates.  At this point, anticipate return to [facility].  Conference with nursing staff related to plan of care, patient needs. Conference with hospitalist related to goals of care.  Healthcare power of attorney NEXT OF KIN -sister, Lisa [name].  No healthcare power of attorney paperwork listed in chart. No husband or children.   SUMMARY OF RECOMMENDATIONS   24-48 hours for outcomes.  Call to sister 10/11 at 1000.   Code Status/Advance Care Planning:  DNR  Symptom Management:   Per hospitalist, no additional needs at this time.  Palliative Prophylaxis:   Aspiration and Turn Reposition  Additional Recommendations (Limitations, Scope, Preferences):  Continue to treat the treatable but no CPR, no intubation  Psycho-social/Spiritual:   Desire for further Chaplaincy support:no  Additional Recommendations: Caregiving  Support/Resources and Education on Hospice  Prognosis:   < 6 months, or less would not be surprising based on poor functional status, recurrent hospitalization, retroperitoneal bleed.  Discharge Planning: Anticipate return to residential SNF      Primary Diagnoses: Present on Admission: . Acute blood loss anemia . Left spastic hemiplegia (HCC) . Nontraumatic subarachnoid hemorrhage from middle cerebral artery (HCC)   I have reviewed the medical record, interviewed the patient and family, and examined the patient. The following aspects are pertinent.  Past Medical History:  Diagnosis Date  . Abnormal posture   . Adjustment disorder   . Anemia, unspecified   . Aphasia-angular gyrus syndrome   .  Arthritis, climacteric, knee, left   . Atypical psychosis (HCC)   . C. difficile diarrhea   . Cerebral arteritis   . Contracture of left ankle   . Essential hypertension, malignant   . Essential hypertension, malignant   . Hyperlipemia   . Left spastic hemiplegia (HCC)   . Muscle weakness (generalized)   . 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 Oropharyngeal dysphagia   . Other sequelae following nontraumatic subarachnoid hemorrhage   . Primary osteoarthritis of both knees   . Screening for thyroid disorder   . Screening for thyroid disorder   . Seizures (HCC)   . Sequelae of cerebral infarction   . Unsteady   . Vascular dementia with delirium [facility])    Social History   Socioeconomic History  . Marital status: Single    Spouse name: Not on file  . Number of children: Not on file  . Years of education: Not on file  . Highest education level: Not on file  Occupational History  . Occupation: na  Social Needs  . Financial resource strain: Not hard at all  . Food insecurity:    Worry: Never true    Inability: Never true  . Transportation needs:    Medical: No    Non-medical: No  Tobacco Use  . Smoking status: Never Smoker  . Smokeless tobacco: Never Used  Substance and Sexual Activity  . Alcohol use: No    Frequency: Never  . Drug use: No  . Sexual activity: Not Currently  Lifestyle  . Physical activity:    Days per week: 0 days    Minutes per session: 0 min  . Stress: Only a little  Relationships  . Social connections:    Talks on phone: Patient refused    Gets together: Patient refused    Attends religious service: Patient refused    Active member of club or organization: Patient refused    Attends meetings of clubs or organizations: Patient refused    Relationship status: Patient refused  Other Topics Concern  . Not on file  Social History Narrative  . Not on file   Family History  Problem Relation Age of Onset  .  Hypertension Other   . Colon cancer Father        diagnosed in his 72s   . Colon polyps Sister    Scheduled Meds: . divalproex  750 mg Oral BID  . polyethylene glycol  17 g Oral BID   Continuous Infusions: . 0.9 % NaCl with KCl 20 mEq / L 50 mL/hr at 04/11/18 0953   PRN Meds:.acetaminophen **OR** acetaminophen, LORazepam, ondansetron **OR** ondansetron (ZOFRAN) IV Medications Prior to Admission:  Prior to Admission medications   Medication Sig Start Date End Date Taking? Authorizing Provider  acetaminophen (TYLENOL) 325 MG  tablet Take by mouth every 6 (six) hours as needed for mild pain.   Yes [provider]  aspirin EC 81 MG tablet Take 1 tablet (81 mg total) by mouth daily. 09/28/17  Yes Tat, Onalee Hua, MD  calcium carbonate (TUMS - DOSED IN MG ELEMENTAL CALCIUM) 500 MG chewable tablet Chew 1 tablet by mouth daily.   Yes [provider]  divalproex (DEPAKOTE) 250 MG DR tablet Take 750 mg by mouth 2 (two) times daily.    Yes [provider]  famotidine (PEPCID) 20 MG tablet Take 20 mg by mouth daily.   Yes [provider]  levETIRAcetam (KEPPRA) 500 MG tablet Take 500 mg by mouth 2 (two) times daily.   Yes [provider]  mirtazapine (REMERON) 15 MG tablet Take 15 mg by mouth at bedtime.   Yes [provider]  sertraline (ZOLOFT) 50 MG tablet Take 50 mg by mouth daily.   Yes [provider]  tiZANidine (ZANAFLEX) 2 MG tablet Take by mouth 2 (two) times daily.   Yes [provider]  traMADol (ULTRAM) 50 MG tablet Take by mouth every 6 (six) hours as needed for moderate pain.   Yes [provider]  Vitamin D, Ergocalciferol, (DRISDOL) 50000 units CAPS capsule Take 50,000 Units by mouth every 7 (seven) days.   Yes [provider]   No Known Allergies Review of Systems  Unable to perform ROS: Mental status change    Physical Exam  Constitutional: No distress.  Appears acutely/chronically ill, frail.   Makes and mostly keeps eye contact.  Able to make her basic needs known  HENT:  Head: Atraumatic.  Cardiovascular: Normal rate.  Pulmonary/Chest: Effort normal. No respiratory distress.  Abdominal: Soft. She exhibits no distension. There is no guarding.  Musculoskeletal: She exhibits no edema.  Contracted, bedbound status  Neurological: She is alert.  Oriented to person only at that time.  Skin: Skin is warm and dry.  Psychiatric:  Smiles, cooperative and pleasant.  Nursing note and vitals reviewed.   Vital Signs: BP (!) 144/69   Pulse 75   Temp 98.3 F (36.8 C) (Oral)   Resp 18   Ht 5\' 4"  (1.626 m)   Wt 56.9 kg   SpO2 97%   BMI 21.53 kg/m  Pain Scale: 0-10   Pain Score: 0-No pain   SpO2: SpO2: 97 % O2 Device:SpO2: 97 % O2 Flow Rate: .   IO: Intake/output summary:   Intake/Output Summary (Last 24 hours) at 04/11/2018 1119 Last data filed at 04/11/2018 1100 Gross per 24 hour  Intake 2116.98 ml  Output 200 ml  Net 1916.98 ml    LBM: Last BM Date: 04/11/18 Baseline Weight: Weight: 56.9 kg Most recent weight: Weight: 56.9 kg     Palliative Assessment/Data:   Flowsheet Rows     Most Recent Value  Intake Tab  Referral Department  Hospitalist  Unit at Time of Referral  Med/Surg Unit  Date Notified  04/11/18  Palliative Care Type  New Palliative care  Reason for referral  Clarify Goals of Care  Date of Admission  04/10/18  # of days IP prior to Palliative referral  1  Clinical Assessment  Pain Max last 24 hours  Not able to report  Pain Min Last 24 hours  Not able to report  Dyspnea Max Last 24 Hours  Not able to report  Dyspnea Min Last 24 hours  Not able to report  Psychosocial & Spiritual Assessment  Palliative Care  Outcomes      Time In: 1500 Time Out: 1550 Time Total: 50 minutes Greater than 50%  of this time was spent counseling and coordinating care related to the above assessment and plan.  Signed by: Katheran Awe, NP   Please contact  Palliative Medicine Team phone at [phone] for questions and concerns.  For individual provider: See Loretha Stapler

## 2018-04-11 NOTE — Consult Note (Signed)
Reason for Consult: Retroperitoneal hematoma Referring Physician: Dr. Rubie [name] is an 62 y.o. female.  HPI: Patient is a 62 year old white female with multiple medical problems who has had several admissions recently for Hemoccult positive stools and anemia.  She was admitted on 04/10/2018 for bowel preparation for intended colonoscopy.  CT scan of the abdomen revealed a large retroperitoneal hematoma.  The etiology of the hematoma is unknown.  Patient denies any trauma.  She has not been anticoagulated.  History is limited from the patient.  Past Medical History:  Diagnosis Date  . Abnormal posture   . Adjustment disorder   . Anemia, unspecified   . Aphasia-angular gyrus syndrome   . Arthritis, climacteric, knee, left   . 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 C. difficile diarrhea   . Cerebral arteritis   . Contracture of left ankle   . Essential hypertension, malignant   . Essential hypertension, malignant   . Hyperlipemia   . Left spastic hemiplegia ([place])   . Muscle weakness (generalized)   . 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 Oropharyngeal dysphagia   . Other sequelae following nontraumatic subarachnoid hemorrhage   . Primary osteoarthritis of both knees   . Screening for thyroid disorder   . Screening for thyroid disorder   . Seizures ([place])   . Sequelae of cerebral infarction   . Unsteady   . Vascular dementia with delirium [facility])     Past Surgical History:  Procedure Laterality Date  . CEREBRAL ANEURYSM REPAIR     2017? then suffered stroke    Family History  Problem Relation Age of Onset  . Hypertension Other   . Colon cancer Father        diagnosed in his 52s   . Colon polyps Sister     Social History:  reports that she has never smoked. She has never used smokeless tobacco. She reports that she does not drink alcohol or use drugs.  Allergies: No Known Allergies  Medications: I have reviewed the patient's current  medications.  Results for orders placed or performed during the hospital encounter of 04/10/18 (from the past 48 hour(s))  CBC     Status: Abnormal   Collection Time: 04/10/18  9:02 AM  Result Value Ref Range   WBC 9.1 4.0 - 10.5 K/uL   RBC 3.22 (L) 3.87 - 5.11 MIL/uL   Hemoglobin 9.6 (L) 12.0 - 15.0 g/dL   HCT 32.7 (L) 36.0 - 46.0 %   MCV 101.6 (H) 80.0 - 100.0 fL   MCH 29.8 26.0 - 34.0 pg   MCHC 29.4 (L) 30.0 - 36.0 g/dL   RDW 14.6 11.5 - 15.5 %   Platelets 245 150 - 400 K/uL   nRBC 0.0 0.0 - 0.2 %    Comment: Performed at [facility], [address], [place], [place] [address]  Basic metabolic panel     Status: Abnormal   Collection Time: 04/10/18  9:02 AM  Result Value Ref Range   Sodium 139 135 - 145 mmol/L   Potassium 3.6 3.5 - 5.1 mmol/L   Chloride 101 98 - 111 mmol/L   CO2 25 22 - 32 mmol/L   Glucose, Bld 105 (H) 70 - 99 mg/dL   BUN 19 8 - 23 mg/dL   Creatinine, Ser 0.64 0.44 - 1.00 mg/dL   Calcium 8.5 (L) 8.9 - 10.3 mg/dL   GFR calc non Af Amer >60 >60 mL/min   GFR calc Af  Amer >60 >60 mL/min    Comment: (NOTE) The eGFR has been calculated using the CKD EPI equation. This calculation has not been validated in all clinical situations. eGFR's persistently <60 mL/min signify possible Chronic Kidney Disease.    Anion gap 13 5 - 15    Comment: Performed at [facility], [address] [place], [place] [address]  Iron and TIBC     Status: None   Collection Time: 04/10/18  9:02 AM  Result Value Ref Range   Iron 34 28 - 170 ug/dL   TIBC 269 250 - 450 ug/dL   Saturation Ratios 13 10.4 - 31.8 %   UIBC 235 ug/dL    Comment: Performed at [facility], [address], [place], [place] [address]  Ferritin     Status: None   Collection Time: 04/10/18  9:02 AM  Result Value Ref Range   Ferritin 255 11 - 307 ng/mL    Comment: Performed at [facility], [address], [place], [place] [address]  HIV antibody (Routine Testing)     Status: None   Collection Time: 04/10/18   9:05 AM  Result Value Ref Range   HIV Screen 4th Generation wRfx Non Reactive Non Reactive    Comment: (NOTE) Performed At: [facility] [place], [place] [id number] Rush Farmer MD HC:[id number]   Protime-INR     Status: None   Collection Time: 04/10/18  9:05 AM  Result Value Ref Range   Prothrombin Time 13.4 11.4 - 15.2 seconds   INR 1.03     Comment: Performed at [facility], [address] [place], [place] [address]  APTT     Status: None   Collection Time: 04/10/18  9:05 AM  Result Value Ref Range   aPTT 32 24 - 36 seconds    Comment: Performed at [facility], [address], [place], [place] [address]  Cortisol-am, blood     Status: Abnormal   Collection Time: 04/10/18  9:27 AM  Result Value Ref Range   Cortisol - AM 29.4 (H) 6.7 - 22.6 ug/dL    Comment: Performed at [facility] [address], [place], [place] [address]  Procalcitonin - Baseline     Status: None   Collection Time: 04/10/18  9:27 AM  Result Value Ref Range   Procalcitonin <0.10 ng/mL    Comment:        Interpretation: PCT (Procalcitonin) <= 0.5 ng/mL: Systemic infection (sepsis) is not likely. Local bacterial infection is possible. (NOTE)       Sepsis PCT Algorithm           Lower Respiratory Tract                                      Infection PCT Algorithm    ----------------------------     ----------------------------         PCT < 0.25 ng/mL                PCT < 0.10 ng/mL         Strongly encourage             Strongly discourage   discontinuation of antibiotics    initiation of antibiotics    ----------------------------     -----------------------------       PCT 0.25 - 0.50 ng/mL  PCT 0.10 - 0.25 ng/mL               OR       >80% decrease in PCT            Discourage initiation of                                            antibiotics      Encourage discontinuation           of antibiotics    ----------------------------      -----------------------------         PCT >= 0.50 ng/mL              PCT 0.26 - 0.50 ng/mL               AND        <80% decrease in PCT             Encourage initiation of                                             antibiotics       Encourage continuation           of antibiotics    ----------------------------     -----------------------------        PCT >= 0.50 ng/mL                  PCT > 0.50 ng/mL               AND         increase in PCT                  Strongly encourage                                      initiation of antibiotics    Strongly encourage escalation           of antibiotics                                     -----------------------------                                           PCT <= 0.25 ng/mL                                                 OR                                        > 80% decrease in PCT  Discontinue / Do not initiate                                             antibiotics Performed at [facility], [address], [place], [place] [address]   Lactic acid, plasma     Status: Abnormal   Collection Time: 04/10/18  9:57 AM  Result Value Ref Range   Lactic Acid, Venous 2.6 (HH) 0.5 - 1.9 mmol/L    Comment: CRITICAL RESULT CALLED TO, READ BACK BY AND VERIFIED WITH: ROGERS,A AT 10:35AM ON 04/10/18 BY [facility] Performed at [facility], [address], [place], [place] [address]   Culture, blood (Routine X 2) w Reflex to ID Panel     Status: None (Preliminary result)   Collection Time: 04/10/18  9:57 AM  Result Value Ref Range   Specimen Description BLOOD RIGHT ANTECUBITAL    Special Requests      BOTTLES DRAWN AEROBIC AND ANAEROBIC Blood Culture adequate volume Performed at [facility], [address] [place], [place] [address]    Culture PENDING    Report Status PENDING   Culture, blood (Routine X 2) w Reflex to ID Panel     Status: None (Preliminary result)   Collection Time: 04/10/18 10:05 AM   Result Value Ref Range   Specimen Description BLOOD BLOOD RIGHT FOREARM    Special Requests      BOTTLES DRAWN AEROBIC AND ANAEROBIC Blood Culture adequate volume Performed at [facility], [address] [place], [place] [address]    Culture PENDING    Report Status PENDING   Urinalysis, Complete w Microscopic     Status: Abnormal   Collection Time: 04/10/18 12:15 PM  Result Value Ref Range   Color, Urine YELLOW YELLOW   APPearance CLEAR CLEAR   Specific Gravity, Urine 1.018 1.005 - 1.030   pH 6.0 5.0 - 8.0   Glucose, UA NEGATIVE NEGATIVE mg/dL   Hgb urine dipstick SMALL (A) NEGATIVE   Bilirubin Urine NEGATIVE NEGATIVE   Ketones, ur 5 (A) NEGATIVE mg/dL   Protein, ur NEGATIVE NEGATIVE mg/dL   Nitrite NEGATIVE NEGATIVE   Leukocytes, UA NEGATIVE NEGATIVE   RBC / HPF 21-50 0 - 5 RBC/hpf   WBC, UA 0-5 0 - 5 WBC/hpf   Bacteria, UA NONE SEEN NONE SEEN   Squamous Epithelial / LPF 0-5 0 - 5   Mucus PRESENT     Comment: Performed at [facility], [address] [place], [place] [address]  Lactic acid, plasma     Status: None   Collection Time: 04/10/18  1:05 PM  Result Value Ref Range   Lactic Acid, Venous 1.5 0.5 - 1.9 mmol/L    Comment: Performed at [facility], [address] [place], [place] [address]  Basic metabolic panel     Status: Abnormal   Collection Time: 04/11/18  5:35 AM  Result Value Ref Range   Sodium 143 135 - 145 mmol/L   Potassium 3.6 3.5 - 5.1 mmol/L   Chloride 110 98 - 111 mmol/L   CO2 26 22 - 32 mmol/L   Glucose, Bld 79 70 - 99 mg/dL   BUN 11 8 - 23 mg/dL   Creatinine, Ser 0.38 (L) 0.44 - 1.00 mg/dL   Calcium 7.8 (L) 8.9 - 10.3 mg/dL   GFR calc non Af Amer >60 >60 mL/min   GFR calc Af  Amer >60 >60 mL/min    Comment: (NOTE) The eGFR has been calculated using the CKD EPI equation. This calculation has not been validated in all clinical situations. eGFR's persistently <60 mL/min signify possible Chronic Kidney Disease.    Anion gap 7 5 - 15     Comment: Performed at [facility], [address], [place], [place] [address]  CBC     Status: Abnormal   Collection Time: 04/11/18  5:35 AM  Result Value Ref Range   WBC 6.0 4.0 - 10.5 K/uL   RBC 2.76 (L) 3.87 - 5.11 MIL/uL   Hemoglobin 8.4 (L) 12.0 - 15.0 g/dL   HCT 28.2 (L) 36.0 - 46.0 %   MCV 102.2 (H) 80.0 - 100.0 fL   MCH 30.4 26.0 - 34.0 pg   MCHC 29.8 (L) 30.0 - 36.0 g/dL   RDW 14.6 11.5 - 15.5 %   Platelets 193 150 - 400 K/uL   nRBC 0.0 0.0 - 0.2 %    Comment: Performed at [facility], [address] [place], [place] [address]  Procalcitonin     Status: None   Collection Time: 04/11/18  5:35 AM  Result Value Ref Range   Procalcitonin <0.10 ng/mL    Comment:        Interpretation: PCT (Procalcitonin) <= 0.5 ng/mL: Systemic infection (sepsis) is not likely. Local bacterial infection is possible. (NOTE)       Sepsis PCT Algorithm           Lower Respiratory Tract                                      Infection PCT Algorithm    ----------------------------     ----------------------------         PCT < 0.25 ng/mL                PCT < 0.10 ng/mL         Strongly encourage             Strongly discourage   discontinuation of antibiotics    initiation of antibiotics    ----------------------------     -----------------------------       PCT 0.25 - 0.50 ng/mL            PCT 0.10 - 0.25 ng/mL               OR       >80% decrease in PCT            Discourage initiation of                                            antibiotics      Encourage discontinuation           of antibiotics    ----------------------------     -----------------------------         PCT >= 0.50 ng/mL              PCT 0.26 - 0.50 ng/mL               AND        <80% decrease in PCT             Encourage initiation of  antibiotics       Encourage continuation           of antibiotics    ----------------------------     -----------------------------        PCT  >= 0.50 ng/mL                  PCT > 0.50 ng/mL               AND         increase in PCT                  Strongly encourage                                      initiation of antibiotics    Strongly encourage escalation           of antibiotics                                     -----------------------------                                           PCT <= 0.25 ng/mL                                                 OR                                        > 80% decrease in PCT                                     Discontinue / Do not initiate                                             antibiotics Performed at [facility], [address] [place], [place] [address]     Ct Abdomen Pelvis W Contrast  Result Date: 04/10/2018 CLINICAL DATA:  RIGHT lower quadrant pain, hypotension, fall in hemoglobin, acute on chronic anemia, elevated lactic acid; history LEFT spastic hemiplegia, prior C difficile colitis EXAM: CT ABDOMEN AND PELVIS WITH CONTRAST TECHNIQUE: Multidetector CT imaging of the abdomen and pelvis was performed using the standard protocol following bolus administration of intravenous contrast. Sagittal and coronal MPR images reconstructed from axial data set. CONTRAST:  126m ISOVUE-300 IOPAMIDOL (ISOVUE-300) INJECTION 61% IV. Dilute oral contrast. COMPARISON:  09/13/2017 FINDINGS: Lower chest: Minimal LEFT pleural effusion and bibasilar atelectasis Hepatobiliary: Gallbladder and liver unremarkable Pancreas: Normal appearance Spleen: Normal appearance Adrenals/Urinary Tract: Adrenal glands normal appearance. Small BILATERAL renal cysts. Kidneys and ureters normal appearance. Question wall thickening of the RIGHT anterolateral bladder wall, see below. Stomach/Bowel: Normal appendix. Markedly increased stool in rectum and distal sigmoid colon, rectum up to 8.5 cm diameter. Minimal rectal wall thickening,  decreased from previous exam. Scattered stool throughout remainder of colon. Stomach  decompressed. Remaining bowel loops unremarkable. Vascular/Lymphatic: Atherosclerotic calcifications aorta, iliac arteries, common femoral arteries, coronary arteries. No adenopathy. Reproductive: Atrophic uterus and ovaries Other: Diffuse intermediate attenuation infiltrative changes are identified in the RIGHT retroperitoneum beginning inferior to the RIGHT kidney at the posterior pararenal fascia and extending into the anterior RIGHT pelvis, with some extension into the RIGHT lateral side wall and into the RIGHT anterior and lateral prevesical space compatible with retroperitoneal hemorrhage. Areas of hemorrhage measure up to 6.1 x 2.3 cm at the base of the RIGHT pericolic gutter and 6.2 x 2.0 cm at the RIGHT prevesical space. Hemorrhage overall approximately 12.5 cm length. Associated mild thickening of the anterolateral RIGHT bladder wall. Source of hemorrhage is uncertain; no aneurysm or mass identified, and no etiology arising from the inferior RIGHT kidney or RIGHT psoas muscle identified. No ascites or free air. Mild stranding and presacral space, nonspecific. No hernia. Musculoskeletal: Scattered subcutaneous edema at the flanks. Bones demineralized. IMPRESSION: RIGHT retroperitoneal hemorrhage in the pelvis as above, of uncertain etiology, maximal dimensions of 12.5 cm length, 2.2 cm thick, and 6.2 cm transverse. Question minimal bladder wall thickening versus artifact in artifact at the anterolateral RIGHT urinary bladder. Markedly increased stool in rectum and distal sigmoid colon. Bibasilar atelectasis and small LEFT pleural effusion. Findings discussed with Dr. Gala Romney on 04/10/2018 at 1440 hours. Electronically Signed   By: Lavonia Dana M.D.   On: 04/10/2018 14:54   Dg Chest Port 1 View  Result Date: 04/10/2018 CLINICAL DATA:  Acute onset shortness of breath. EXAM: PORTABLE CHEST 1 VIEW COMPARISON:  09/13/2017. FINDINGS: Markedly suboptimal inspiration with linear atelectasis at both lung bases.  Lungs otherwise clear. Pulmonary vascularity normal. No pleural effusions. Cardiac silhouette normal in size for AP portable technique. IMPRESSION: Markedly suboptimal inspiration which accounts for atelectasis at the lung bases. No acute cardiopulmonary disease otherwise. Electronically Signed   By: Evangeline Dakin M.D.   On: 04/10/2018 11:43    ROS:  Review of systems not obtained due to patient factors.  Blood pressure (!) 144/73, pulse 82, temperature 98.4 F (36.9 C), temperature source Oral, resp. rate 14, height '5\' 4"'$  (1.626 m), weight 56.9 kg, SpO2 100 %. Physical Exam: Pleasant white female in no acute distress Head is normocephalic, atraumatic Lungs clear to auscultation with good breath sounds bilaterally Heart examination reveals a regular rate and rhythm without S3, S4, murmurs Abdomen soft with some fullness and discomfort to palpation in the right flank and right lower quadrant.  I did not appreciate flank ecchymosis. Extremities: Bilateral femoral pulses noted. CT scan images personally reviewed Assessment/Plan: Impression: Retroperitoneal hematoma of unknown etiology.  In many cases, this is spontaneous in nature and no etiology can be found.  She has had a little bit of a drift downward in her hemoglobin, but appears to be stable overall.  Rarely does this require surgical expiration.  Should she become unstable and drop her hemoglobin, consultation with vascular and interventional radiology would be warranted.  Would avoid any anticoagulation.  It is unknown whether she was receiving any anticoagulation during previous hospitalizations or outpatient care due to her bedridden state.  Lisa [name] 04/11/2018, 8:25 AM

## 2018-04-11 NOTE — NC FL2 (Signed)
[place] MEDICAID FL2 LEVEL OF CARE SCREENING TOOL     IDENTIFICATION  Patient Name: Lisa [name] Birthdate: Jul 08, 1955 Sex: female Admission Date (Current Location): 04/10/2018  [facility] and [place] Number:  [employer] and Address:  [facility],  [address] [place] [address]      Provider Number: [id number]  Attending Physician Name and Address:  Catarina Hartshorn, MD  Relative Name and Phone Number:       Current Level of Care: Other (Comment)(observation) Recommended Level of Care: Skilled Nursing Facility Prior Approval Number:    Date Approved/Denied:   PASRR Number:    Discharge Plan: SNF    Current Diagnoses: Patient Active Problem List   Diagnosis Date Noted  . Retroperitoneal hematoma   . Acute blood loss anemia 04/10/2018  . Hypotension 04/10/2018  . Seizure disorder (HCC) 04/10/2018  . RLQ abdominal pain   . Heme positive stool 02/28/2018  . E coli bacteremia 09/26/2017  . Sepsis due to Escherichia coli (E. coli) (HCC) 09/26/2017  . C. difficile colitis 09/25/2017  . Sepsis due to undetermined organism (HCC) 09/24/2017  . Left spastic hemiplegia (HCC) 09/14/2017  . Acute pyelonephritis 09/13/2017  . Hypokalemia 09/13/2017  . Anemia, unspecified 09/13/2017  . Hyperlipemia 09/13/2017  . 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 Hypertension 09/13/2017    Orientation RESPIRATION BLADDER Height & Weight     Self, Time, Situation, Place  Normal Incontinent Weight: 125 lb 7.1 oz (56.9 kg) Height:  5\' 4"  (162.6 cm)  BEHAVIORAL SYMPTOMS/MOOD NEUROLOGICAL BOWEL NUTRITION STATUS      Incontinent Diet(Regular)  AMBULATORY STATUS COMMUNICATION OF NEEDS Skin   Total Care Verbally Normal                       Personal Care Assistance Level of Assistance  Bathing, Dressing, Total care Bathing Assistance: Maximum assistance Feeding assistance: Independent Dressing Assistance:  Maximum assistance     Functional Limitations Info             SPECIAL CARE FACTORS FREQUENCY                       Contractures Contractures Info: Not present    Additional Factors Info  Code Status, Allergies, Psychotropic Code Status Info: Full Code Allergies Info: NKA Psychotropic Info: Depakote         Current Medications (04/11/2018):  This is the current hospital active medication list Current Facility-Administered Medications  Medication Dose Route Frequency Provider Last Rate Last Dose  . 0.9 % NaCl with KCl 20 mEq/ L  infusion   Intravenous Continuous Tat, Onalee Hua, MD 50 mL/hr at 04/11/18 0953    . acetaminophen (TYLENOL) tablet 650 mg  650 mg Oral Q6H PRN Tat, David, MD       Or  . acetaminophen (TYLENOL) suppository 650 mg  650 mg Rectal Q6H PRN Tat, Onalee Hua, MD      . divalproex (DEPAKOTE) DR tablet 750 mg  750 mg Oral BID Tat, David, MD   750 mg at 04/11/18 1041  . LORazepam (ATIVAN) injection 1 mg  1 mg Intravenous Q4H PRN Tat, David, MD      . ondansetron (ZOFRAN) tablet 4 mg  4 mg Oral Q6H PRN Tat, David, MD       Or  . ondansetron (ZOFRAN) injection 4 mg  4 mg Intravenous Q6H PRN Tat, Onalee Hua, MD      . polyethylene  glycol (MIRALAX / GLYCOLAX) packet 17 g  17 g Oral BID Corbin Ade, MD   17 g at 04/11/18 1040     Discharge Medications: Please see discharge summary for a list of discharge medications.  Relevant Imaging Results:  Relevant Lab Results:   Additional Information    Sulamita Lafountain, Juleen China, LCSW

## 2018-04-11 NOTE — Progress Notes (Addendum)
Responded to nursing call:   Pt began "shaking" again.  Pt having bilateral UE shaking and facial twitching.  Subjective: Pt denies cp, sob, nv/d, headache, visual disturbance.  She is awake and follows commands  Vitals:   04/10/18 1959 04/11/18 0621 04/11/18 1044 04/11/18 1418  BP:  (!) 144/73 (!) 144/69 (!) 165/96  Pulse:  82 75 93  Resp:  14 18 20   Temp:  98.4 F (36.9 C) 98.3 F (36.8 C) 98.8 F (37.1 C)  TempSrc:  Oral Oral Oral  SpO2: 95% 100% 97% (!) 85%  Weight:      Height:       CV--RRR Lung--bibasilar crackles Abd--soft+BS/NT   Assessment/Plan: Seizure vs myoclonus -ativan 1 mg IV x 1 -neurology consult -VPA level still pending -EEG ordered earlier -check ck   Total time 15 min in addition to the 20 min spent earlier  Catarina Hartshorn, DO [facility]

## 2018-04-11 NOTE — Care Management Note (Signed)
Case Management Note  Patient Details  Name: Lisa [name] MRN: [id number] Date of Birth: Jul 11, 1955  Subjective/Objective:                  Acute blood loss anemia. CM consulted for home health aide.   Action/Plan: Patient is a resident of [facility]. CSW aware and following patient. No CM needs.   Expected Discharge Date:       unk           Expected Discharge Plan:  Skilled Nursing Facility  In-House Referral:  Clinical Social Work, Hospice / Palliative Care  Discharge planning Services  CM Consult  Post Acute Care Choice:    Choice offered to:     DME Arranged:    DME Agency:     HH Arranged:    HH Agency:     Status of Service:  Completed, signed off  If discussed at [employer], dates discussed:    Additional Comments:  Ibn Stief, Chrystine Oiler, RN 04/11/2018, 2:54 PM

## 2018-04-11 NOTE — Progress Notes (Signed)
PROGRESS NOTE  Lisa [name] WUJ:[id number] DOB: Aug 16, 1955 DOA: 04/10/2018 PCP: Audree Bane, DO  Brief History:   62 y.o. 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  The patient was seen as an outpatient at [facility] on 02/27/2018 for heme positive stool.  The patient has been more deconditioned since suffering sepsis from E. coli bacteremia and pyelonephritis with resultant CDI in March 2019.  She was noted to have heme positive stools and sent to gastroenterology.  Her hemoglobin was noted to be 11.3 on 09/28/2017.  On 11/22/2017, the patient's hemoglobin was 12.1.  Since that time, the patient had her hemoglobin rechecked on 04/08/2018 and it was noted to be 8.6.  As result, the risks, benefits, and alternatives were discussed with the patient regarding colonoscopy, and the patient's sister want to pursue colonoscopy.  Because of the patient's comorbidities, it was felt that the patient will be better served being admitted to the hospital for preparation and the colonoscopy itself.  Apparently, the patient has been following neurology at [facility].  She has been cleared by neurology for colonoscopy.  Assessment/Plan: Acute blood loss anemia/Hemoccult positive stool -GI consulted--planning for colonoscopy -Check CBC--presenting hemoglobin 9.6 -Check coags--unremarkable -colonoscopy cancelled until later time due to retroperitoneal bleed  Retroperitoneal bleed -04/10/2018 CT abdomen--right retroperitoneal hemorrhage of uncertain etiology; minimal bladder wall thickening  versus artifact; marked increase stool in the rectum and distal sigmoid colon -General surgery consulted--> supportive care, monitor hemoglobin -Drop in hemoglobin dilutional -case discussed with Dr. Lovell Sheehan  Hypotension -am cortisol--29.4 -IVF--1.5 L bolus and started  -CBC--presenting hemoglobin 9.6 -lactic acid 2.6 -procalcitonin<0.10 -fluid bolus and start maintenance IVF -blood cultures x 2 sets--neg -CXR personally reviewed--bibasilar atelectasis  Constipation -abd pain improved after MOM enema -multiple BMs after MOM enema  Seizure disorder -Continue Depakote -Check Depakote level -EEG as pt intermittently having left arm "twitching"  Essential hypertension -no longer on BP meds -work up for hypotension as above  Right hemiplegia--residual effect of stroke -PT evaluation -holding aspirin for GI work up and retroperitoneal bleed  Vascular Dementia -at risk for hospital delirium     Disposition Plan:   SNF in 1-2 days  Family Communication:  No Family at bedside  Consultants:  GI, general surgery  Code Status:  FULL  DVT Prophylaxis:  SCDs   Procedures: As Listed in Progress Note Above  Antibiotics: None    Subjective: Patient states abdominal pain is improved after multiple bowel movements.  She denies any fevers, chills, chest pain, shortness breath, nausea or vomiting, diarrhea, abdominal pain.  There is no dysuria hematuria.  Objective: Vitals:   04/10/18 1959 04/11/18 0621 04/11/18 1044 04/11/18 1418  BP:  (!) 144/73 (!) 144/69 (!) 165/96  Pulse:  82 75 93  Resp:  14 18 20   Temp:  98.4 F (36.9 C) 98.3 F (36.8 C) 98.8 F (37.1 C)  TempSrc:  Oral Oral Oral  SpO2: 95% 100% 97% (!) 85%  Weight:      Height:        Intake/Output Summary (Last 24 hours) at 04/11/2018 1504 Last data filed at 04/11/2018 1100 Gross per 24 hour  Intake 580 ml  Output -  Net 580 ml   Weight change:  Exam:   General:  Pt is alert, follows  commands appropriately, not in acute distress  HEENT: No icterus, No thrush, No neck mass, Leonore/AT  Cardiovascular: RRR, S1/S2, no rubs, no gallops  Respiratory: Fine bibasilar crackles but no wheezing.  Good air movement.  Abdomen: Soft/+BS, non tender, non distended, no guarding  Extremities: No edema, No lymphangitis, No petechiae, No rashes, no synovitis   Data Reviewed: I have personally reviewed following labs and imaging studies Basic Metabolic Panel: Recent Labs  Lab 04/10/18 0902 04/11/18 0535  NA 139 143  K 3.6 3.6  CL 101 110  CO2 25 26  GLUCOSE 105* 79  BUN 19 11  CREATININE 0.64 0.38*  CALCIUM 8.5* 7.8*   Liver Function Tests: No results for input(s): AST, ALT, ALKPHOS, BILITOT, PROT, ALBUMIN in the last 168 hours. No results for input(s): LIPASE, AMYLASE in the last 168 hours. No results for input(s): AMMONIA in the last 168 hours. Coagulation Profile: Recent Labs  Lab 04/10/18 0905  INR 1.03   CBC: Recent Labs  Lab 04/10/18 0902 04/11/18 0535  WBC 9.1 6.0  HGB 9.6* 8.4*  HCT 32.7* 28.2*  MCV 101.6* 102.2*  PLT 245 193   Cardiac Enzymes: No results for input(s): CKTOTAL, CKMB, CKMBINDEX, TROPONINI in the last 168 hours. BNP: Invalid input(s): POCBNP CBG: No results for input(s): GLUCAP in the last 168 hours. HbA1C: No results for input(s): HGBA1C in the last 72 hours. Urine analysis:    Component Value Date/Time   COLORURINE YELLOW 04/10/2018 1215   APPEARANCEUR CLEAR 04/10/2018 1215   LABSPEC 1.018 04/10/2018 1215   PHURINE 6.0 04/10/2018 1215   GLUCOSEU NEGATIVE 04/10/2018 1215   HGBUR SMALL (A) 04/10/2018 1215   BILIRUBINUR NEGATIVE 04/10/2018 1215   KETONESUR 5 (A) 04/10/2018 1215   PROTEINUR NEGATIVE 04/10/2018 1215   NITRITE NEGATIVE 04/10/2018 1215   LEUKOCYTESUR NEGATIVE 04/10/2018 1215   Sepsis Labs: @LABRCNTIP (procalcitonin:4,lacticidven:4) ) Recent Results (from the past 240 hour(s))  Culture, blood (Routine X 2) w  Reflex to ID Panel     Status: None (Preliminary result)   Collection Time: 04/10/18  9:57 AM  Result Value Ref Range Status   Specimen Description BLOOD RIGHT ANTECUBITAL  Final   Special Requests   Final    BOTTLES DRAWN AEROBIC AND ANAEROBIC Blood Culture adequate volume   Culture   Final    NO GROWTH < 24 HOURS Performed at [facility], [address], [place], [place] [address]    Report Status PENDING  Incomplete  Culture, blood (Routine X 2) w Reflex to ID Panel     Status: None (Preliminary result)   Collection Time: 04/10/18 10:05 AM  Result Value Ref Range Status   Specimen Description BLOOD BLOOD RIGHT FOREARM  Final   Special Requests   Final    BOTTLES DRAWN AEROBIC AND ANAEROBIC Blood Culture adequate volume   Culture   Final    NO GROWTH < 24 HOURS Performed at [facility], [address] [place], [place] [address]    Report Status PENDING  Incomplete     Scheduled Meds: . divalproex  750 mg Oral BID  . polyethylene glycol  17 g Oral BID   Continuous Infusions: . 0.9 % NaCl with KCl 20 mEq / L 50 mL/hr at 04/11/18 5366    Procedures/Studies: Ct Abdomen Pelvis W Contrast  Result Date: 04/10/2018 CLINICAL DATA:  RIGHT lower quadrant pain, hypotension, fall in hemoglobin,  acute on chronic anemia, elevated lactic acid; history LEFT spastic hemiplegia, prior C difficile colitis EXAM: CT ABDOMEN AND PELVIS WITH CONTRAST TECHNIQUE: Multidetector CT imaging of the abdomen and pelvis was performed using the standard protocol following bolus administration of intravenous contrast. Sagittal and coronal MPR images reconstructed from axial data set. CONTRAST:  ISOVUE-300 IOPAMIDOL (ISOVUE-300) INJECTION 61% IV. Dilute oral contrast. COMPARISON:  09/13/2017 FINDINGS: Lower chest: Minimal LEFT pleural effusion and bibasilar atelectasis Hepatobiliary: Gallbladder and liver unremarkable Pancreas: Normal appearance Spleen: Normal appearance Adrenals/Urinary Tract: Adrenal  glands normal appearance. Small BILATERAL renal cysts. Kidneys and ureters normal appearance. Question wall thickening of the RIGHT anterolateral bladder wall, see below. Stomach/Bowel: Normal appendix. Markedly increased stool in rectum and distal sigmoid colon, rectum up to 8.5 cm diameter. Minimal rectal wall thickening, decreased from previous exam. Scattered stool throughout remainder of colon. Stomach decompressed. Remaining bowel loops unremarkable. Vascular/Lymphatic: Atherosclerotic calcifications aorta, iliac arteries, common femoral arteries, coronary arteries. No adenopathy. Reproductive: Atrophic uterus and ovaries Other: Diffuse intermediate attenuation infiltrative changes are identified in the RIGHT retroperitoneum beginning inferior to the RIGHT kidney at the posterior pararenal fascia and extending into the anterior RIGHT pelvis, with some extension into the RIGHT lateral side wall and into the RIGHT anterior and lateral prevesical space compatible with retroperitoneal hemorrhage. Areas of hemorrhage measure up to 6.1 x 2.3 cm at the base of the RIGHT pericolic gutter and 6.2 x 2.0 cm at the RIGHT prevesical space. Hemorrhage overall approximately 12.5 cm length. Associated mild thickening of the anterolateral RIGHT bladder wall. Source of hemorrhage is uncertain; no aneurysm or mass identified, and no etiology arising from the inferior RIGHT kidney or RIGHT psoas muscle identified. No ascites or free air. Mild stranding and presacral space, nonspecific. No hernia. Musculoskeletal: Scattered subcutaneous edema at the flanks. Bones demineralized. IMPRESSION: RIGHT retroperitoneal hemorrhage in the pelvis as above, of uncertain etiology, maximal dimensions of 12.5 cm length, 2.2 cm thick, and 6.2 cm transverse. Question minimal bladder wall thickening versus artifact in artifact at the anterolateral RIGHT urinary bladder. Markedly increased stool in rectum and distal sigmoid colon. Bibasilar  atelectasis and small LEFT pleural effusion. Findings discussed with Dr. Jena Gauss on 04/10/2018 at 1440 hours. Electronically Signed   By: Ulyses Southward M.D.   On: 04/10/2018 14:54   Dg Chest Port 1 View  Result Date: 04/10/2018 CLINICAL DATA:  Acute onset shortness of breath. EXAM: PORTABLE CHEST 1 VIEW COMPARISON:  09/13/2017. FINDINGS: Markedly suboptimal inspiration with linear atelectasis at both lung bases. Lungs otherwise clear. Pulmonary vascularity normal. No pleural effusions. Cardiac silhouette normal in size for AP portable technique. IMPRESSION: Markedly suboptimal inspiration which accounts for atelectasis at the lung bases. No acute cardiopulmonary disease otherwise. Electronically Signed   By: Hulan Saas M.D.   On: 04/10/2018 11:43    Catarina Hartshorn, DO  [facility] Pager [phone]  If 7PM-7AM, please contact night-coverage [email or web address] Password [email or web address] 04/11/2018, 3:04 PM   LOS: 1 day

## 2018-04-11 NOTE — Telephone Encounter (Signed)
Patient needs hospital follow up in 3-4 weeks (may use urgent) to reschedule colonoscopy with inpatient bowel prep once recovered from retroperitoneal bleed.

## 2018-04-11 NOTE — Progress Notes (Signed)
Subjective:  Patient reports abdominal pain improved today. She feels hungry. Had good results with enemas yesterday.   Objective: Vital signs in last 24 hours: Temp:  [97.6 F (36.4 C)-98.4 F (36.9 C)] 98.4 F (36.9 C) (10/10 0621) Pulse Rate:  [72-82] 82 (10/10 0621) Resp:  [14-16] 14 (10/10 0621) BP: (63-144)/(44-78) 144/73 (10/10 0621) SpO2:  [95 %-100 %] 100 % (10/10 0621) Weight:  [56.9 kg] 56.9 kg (10/09 0850) Last BM Date: 04/10/18 General:   Alert,  Well-developed, well-nourished, pleasant and cooperative in NAD Head:  Normocephalic and atraumatic. Eyes:  Sclera clear, no icterus.  Abdomen:  Soft, mild rlq tenderness and nondistended.  Normal bowel sounds, without guarding, and without rebound.    Neurologic:  Alert and  oriented x4  Skin:  Intact without significant lesions or rashes. Psych:  Alert and cooperative. Normal mood and affect.  Intake/Output from previous day: 10/09 0701 - 10/10 0700 In: 1777 [P.O.:240; I.V.:36.2; IV Piggyback:1500.8] Out: 200 [Urine:200] Intake/Output this shift: No intake/output data recorded.  Lab Results: CBC Recent Labs    04/10/18 0902 04/11/18 0535  WBC 9.1 6.0  HGB 9.6* 8.4*  HCT 32.7* 28.2*  MCV 101.6* 102.2*  PLT 245 193   BMET Recent Labs    04/10/18 0902 04/11/18 0535  NA 139 143  K 3.6 3.6  CL 101 110  CO2 25 26  GLUCOSE 105* 79  BUN 19 11  CREATININE 0.64 0.38*  CALCIUM 8.5* 7.8*   LFTs No results for input(s): BILITOT, BILIDIR, IBILI, ALKPHOS, AST, ALT, PROT, ALBUMIN in the last 72 hours. No results for input(s): LIPASE in the last 72 hours. PT/INR Recent Labs    04/10/18 0905  LABPROT 13.4  INR 1.03      Imaging Studies: Ct Abdomen Pelvis W Contrast  Result Date: 04/10/2018 CLINICAL DATA:  RIGHT lower quadrant pain, hypotension, fall in hemoglobin, acute on chronic anemia, elevated lactic acid; history LEFT spastic hemiplegia, prior C difficile colitis EXAM: CT ABDOMEN AND PELVIS WITH  CONTRAST TECHNIQUE: Multidetector CT imaging of the abdomen and pelvis was performed using the standard protocol following bolus administration of intravenous contrast. Sagittal and coronal MPR images reconstructed from axial data set. CONTRAST:  ISOVUE-300 IOPAMIDOL (ISOVUE-300) INJECTION 61% IV. Dilute oral contrast. COMPARISON:  09/13/2017 FINDINGS: Lower chest: Minimal LEFT pleural effusion and bibasilar atelectasis Hepatobiliary: Gallbladder and liver unremarkable Pancreas: Normal appearance Spleen: Normal appearance Adrenals/Urinary Tract: Adrenal glands normal appearance. Small BILATERAL renal cysts. Kidneys and ureters normal appearance. Question wall thickening of the RIGHT anterolateral bladder wall, see below. Stomach/Bowel: Normal appendix. Markedly increased stool in rectum and distal sigmoid colon, rectum up to 8.5 cm diameter. Minimal rectal wall thickening, decreased from previous exam. Scattered stool throughout remainder of colon. Stomach decompressed. Remaining bowel loops unremarkable. Vascular/Lymphatic: Atherosclerotic calcifications aorta, iliac arteries, common femoral arteries, coronary arteries. No adenopathy. Reproductive: Atrophic uterus and ovaries Other: Diffuse intermediate attenuation infiltrative changes are identified in the RIGHT retroperitoneum beginning inferior to the RIGHT kidney at the posterior pararenal fascia and extending into the anterior RIGHT pelvis, with some extension into the RIGHT lateral side wall and into the RIGHT anterior and lateral prevesical space compatible with retroperitoneal hemorrhage. Areas of hemorrhage measure up to 6.1 x 2.3 cm at the base of the RIGHT pericolic gutter and 6.2 x 2.0 cm at the RIGHT prevesical space. Hemorrhage overall approximately 12.5 cm length. Associated mild thickening of the anterolateral RIGHT bladder wall. Source of hemorrhage is uncertain; no aneurysm or mass identified,  and no etiology arising from the inferior RIGHT  kidney or RIGHT psoas muscle identified. No ascites or free air. Mild stranding and presacral space, nonspecific. No hernia. Musculoskeletal: Scattered subcutaneous edema at the flanks. Bones demineralized. IMPRESSION: RIGHT retroperitoneal hemorrhage in the pelvis as above, of uncertain etiology, maximal dimensions of 12.5 cm length, 2.2 cm thick, and 6.2 cm transverse. Question minimal bladder wall thickening versus artifact in artifact at the anterolateral RIGHT urinary bladder. Markedly increased stool in rectum and distal sigmoid colon. Bibasilar atelectasis and small LEFT pleural effusion. Findings discussed with Dr. Jena Gauss on 04/10/2018 at 1440 hours. Electronically Signed   By: Ulyses Southward M.D.   On: 04/10/2018 14:54   Dg Chest Port 1 View  Result Date: 04/10/2018 CLINICAL DATA:  Acute onset shortness of breath. EXAM: PORTABLE CHEST 1 VIEW COMPARISON:  09/13/2017. FINDINGS: Markedly suboptimal inspiration with linear atelectasis at both lung bases. Lungs otherwise clear. Pulmonary vascularity normal. No pleural effusions. Cardiac silhouette normal in size for AP portable technique. IMPRESSION: Markedly suboptimal inspiration which accounts for atelectasis at the lung bases. No acute cardiopulmonary disease otherwise. Electronically Signed   By: Hulan Saas M.D.   On: 04/10/2018 11:43  [2 weeks]   Assessment: 62 year old female originally brought into the hospital for assistance with bowel preparation for colonoscopy to evaluate Hemoccult positive stool.  Last week hemoglobin was noted to be down 3 g (12.1 in 10/2017 to 8.6) from baseline without any overt bleeding noted.  Yesterday when she presented she had significant hypotension, complained of right lower quadrant pain which was new.  CT showed significant right retroperitoneal hemorrhage, etiology unclear with no gross vascular abnormalities.  No obvious bruising noted on exam, no reported trauma. Appreciate surgery input.  Hemoglobin  relatively stable. Also noted to have significant stool in the rectum/rectosigmoid colon.  Received milk of molasses enemas yesterday, needs to maintain MiraLAX twice daily daily, can reduce to once daily if develops diarrhea.  Colonoscopy has been postponed due to retroperitoneal hemorrhage with plans to regroup at a later date.   Plan: 1. Continue miralax 17 grams bid to avoid constipation/impaction. Can decrease to once daily if future if develops frequent/loose stools.  2. Cancel plans for colonoscopy this admission.  3. Avoid anticoagulation/antiplatelet therapy/ASA.  4. Transfuse as needed.  5. Will continue to follow with you.   Leanna Battles. [facility] [phone] 10/10/20199:32 AM     LOS: 1 day

## 2018-04-11 NOTE — Consult Note (Signed)
[place] A. Merlene Laughter, MD     [email or web address]          Lisa [name] is an 62 y.o. female.   ASSESSMENT/PLAN: Episode of shaking: The patient seemed to have near persistent tremor of the left upper extremity which fluctuates which appears to have gotten worse.  Although she has a history of epilepsy, I doubt that the persistent shaking are epileptic.  Agree with EEG.  Continue with current antiepileptic medications.  Avoid benzodiazepines as this is unlikely to help with the tremor long-term in the will likely affect her cognition.  Consider discontinuation of tramadol as this can be associated with increased risk of serotonin syndrome and tremors and seizures.  Baseline epilepsy syndrome: Continue with current dose of Keppra and Depakote.  Follow-up Depakote level.  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  She was noted to have a shaking episode and hence a neurological consultation.  The nurses report however that she was having frequent shaking of the left upper extremity and also the right facial region.  It turns out that this got acutely worse.  She does have a baseline history of epilepsy.  The patient cannot provide a history as she has significant cognitive impairment at baseline.    GENERAL: She appears to be in discomfort but no acute distress  HEENT: Poor dentition; there is severe hypertrophy of the sternocleidomastoid muscles on the left with lateralocollis to the right.  ABDOMEN: soft  EXTREMITIES: No edema   BACK: Unremarkable  SKIN: Normal by inspection.    MENTAL STATUS: She is awake and alert.  She speaks with an [place] accent.  She does follow commands.  She thinks he is at the nursing home.  She is not oriented to time and in general not oriented  to her medical condition.    CRANIAL NERVES: Pupils are equal, round and reactive to light and accomodation; extra ocular movements are full, there is no significant nystagmus; visual fields are full; upper and lower facial muscles are normal in strength and symmetric, there is no flattening of the nasolabial folds; tongue is midline  MOTOR: There is a moderate spasticity involving the left upper extremity.  The left leg is flaccid.  Left upper extremity strength is about 2-3.  Left leg 0/5.  Right upper extremity 3/5.  She follows commands briskly involving the right upper extremity.  The lobe of the right lower extremity is externally rotated and abducted.  It is contracted at the knee at the above 45 degree.  She has severe pain with extension of the knee.  COORDINATION: There is continuous rhythmic moderate amplitude moderate frequency tremor involving the left upper extremity.  This is associated with moderate spasticity.  I do not appreciate significant bradykinesia.  There is no tremor of the right upper extremity.  There are no abnormal movements of the lower extremities.  REFLEXES: Deep tendon reflexes are symmetrical and normal.   SENSATION: She responds to painful stimuli bilaterally.        Blood pressure (!) 165/96, pulse 93, temperature 98.8 F (37.1 C), temperature source Oral, resp. rate 20, height '5\' 4"'$  (1.626 m), weight 56.9 kg, SpO2 (!) 85 %.  Past Medical History:  Diagnosis Date  . Abnormal posture   . Adjustment disorder   . Anemia, unspecified   .  Aphasia-angular gyrus syndrome   . Arthritis, climacteric, knee, left   . 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 C. difficile diarrhea   . Cerebral arteritis   . Contracture of left ankle   . Essential hypertension, malignant   . Essential hypertension, malignant   . Hyperlipemia   . Left spastic hemiplegia ([place])   . Muscle weakness (generalized)   . 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 Oropharyngeal dysphagia   . Other sequelae following nontraumatic subarachnoid hemorrhage   . Primary osteoarthritis of both knees   . Screening for thyroid disorder   . Screening for thyroid disorder   . Seizures ([place])   . Sequelae of cerebral infarction   . Unsteady   . Vascular dementia with delirium [facility])     Past Surgical History:  Procedure Laterality Date  . CEREBRAL ANEURYSM REPAIR     2017? then suffered stroke    Family History  Problem Relation Age of Onset  . Hypertension Other   . Colon cancer Father        diagnosed in his 72s   . Colon polyps Sister     Social History:  reports that she has never smoked. She has never used smokeless tobacco. She reports that she does not drink alcohol or use drugs.  Allergies: No Known Allergies  Medications: Prior to Admission medications   Medication Sig Start Date End Date Taking? Authorizing Provider  acetaminophen (TYLENOL) 325 MG tablet Take by mouth every 6 (six) hours as needed for mild pain.   Yes [provider]  aspirin EC 81 MG tablet Take 1 tablet (81 mg total) by mouth daily. 09/28/17  Yes Tat, Shanon Brow, MD  calcium carbonate (TUMS - DOSED IN MG ELEMENTAL CALCIUM) 500 MG chewable tablet Chew 1 tablet by mouth daily.   Yes [provider]  divalproex (DEPAKOTE) 250 MG DR tablet Take 750 mg by mouth 2 (two) times daily.    Yes [provider]  famotidine (PEPCID) 20 MG tablet Take 20 mg by mouth daily.   Yes [provider]  levETIRAcetam (KEPPRA) 500 MG tablet Take 500 mg by mouth 2 (two) times daily.   Yes [provider]  mirtazapine (REMERON) 15 MG tablet Take 15 mg by mouth at bedtime.   Yes [provider]  sertraline (ZOLOFT) 50 MG tablet Take 50 mg by mouth daily.   Yes [provider]  tiZANidine (ZANAFLEX) 2 MG tablet Take by mouth 2 (two) times daily.   Yes [provider]  traMADol (ULTRAM) 50 MG tablet Take by mouth every 6 (six)  hours as needed for moderate pain.   Yes [provider]  Vitamin D, Ergocalciferol, (DRISDOL) 50000 units CAPS capsule Take 50,000 Units by mouth every 7 (seven) days.   Yes [provider]    Scheduled Meds: . divalproex  750 mg Oral BID  . polyethylene glycol  17 g Oral BID   Continuous Infusions: . 0.9 % NaCl with KCl 20 mEq / L 50 mL/hr at 04/11/18 0953  . levETIRAcetam 500 mg (04/11/18 1945)   PRN Meds:.acetaminophen **OR** acetaminophen, LORazepam, ondansetron **OR** ondansetron (ZOFRAN) IV     Results for orders placed or performed during the hospital encounter of 04/10/18 (from the past 48 hour(s))  CBC     Status: Abnormal   Collection Time: 04/10/18  9:02 AM  Result Value Ref Range   WBC 9.1 4.0 - 10.5 K/uL   RBC  3.22 (L) 3.87 - 5.11 MIL/uL   Hemoglobin 9.6 (L) 12.0 - 15.0 g/dL   HCT 32.7 (L) 36.0 - 46.0 %   MCV 101.6 (H) 80.0 - 100.0 fL   MCH 29.8 26.0 - 34.0 pg   MCHC 29.4 (L) 30.0 - 36.0 g/dL   RDW 14.6 11.5 - 15.5 %   Platelets 245 150 - 400 K/uL   nRBC 0.0 0.0 - 0.2 %    Comment: Performed at [facility], [address] [place], [place] [address]  Basic metabolic panel     Status: Abnormal   Collection Time: 04/10/18  9:02 AM  Result Value Ref Range   Sodium 139 135 - 145 mmol/L   Potassium 3.6 3.5 - 5.1 mmol/L   Chloride 101 98 - 111 mmol/L   CO2 25 22 - 32 mmol/L   Glucose, Bld 105 (H) 70 - 99 mg/dL   BUN 19 8 - 23 mg/dL   Creatinine, Ser 0.64 0.44 - 1.00 mg/dL   Calcium 8.5 (L) 8.9 - 10.3 mg/dL   GFR calc non Af Amer >60 >60 mL/min   GFR calc Af Amer >60 >60 mL/min    Comment: (NOTE) The eGFR has been calculated using the CKD EPI equation. This calculation has not been validated in all clinical situations. eGFR's persistently <60 mL/min signify possible Chronic Kidney Disease.    Anion gap 13 5 - 15    Comment: Performed at [facility], [address], [place], [place] [address]  Iron and TIBC     Status: None   Collection  Time: 04/10/18  9:02 AM  Result Value Ref Range   Iron 34 28 - 170 ug/dL   TIBC 269 250 - 450 ug/dL   Saturation Ratios 13 10.4 - 31.8 %   UIBC 235 ug/dL    Comment: Performed at [facility], [address], [place], [place] [address]  Ferritin     Status: None   Collection Time: 04/10/18  9:02 AM  Result Value Ref Range   Ferritin 255 11 - 307 ng/mL    Comment: Performed at [facility], [address] [place], [place] [address]  HIV antibody (Routine Testing)     Status: None   Collection Time: 04/10/18  9:05 AM  Result Value Ref Range   HIV Screen 4th Generation wRfx Non Reactive Non Reactive    Comment: (NOTE) Performed At: [facility] [place], [place] [id number] Rush Farmer MD [id number]   Protime-INR     Status: None   Collection Time: 04/10/18  9:05 AM  Result Value Ref Range   Prothrombin Time 13.4 11.4 - 15.2 seconds   INR 1.03     Comment: Performed at [facility], [address] [place], [place] [address]  APTT     Status: None   Collection Time: 04/10/18  9:05 AM  Result Value Ref Range   aPTT 32 24 - 36 seconds    Comment: Performed at [facility], [address], [place], [place] [address]  Cortisol-am, blood     Status: Abnormal   Collection Time: 04/10/18  9:27 AM  Result Value Ref Range   Cortisol - AM 29.4 (H) 6.7 - 22.6 ug/dL    Comment: Performed at [facility] [address], [place], [place] [address]  Procalcitonin - Baseline     Status: None   Collection Time: 04/10/18  9:27 AM  Result Value Ref Range   Procalcitonin <0.10 ng/mL  Comment:        Interpretation: PCT (Procalcitonin) <= 0.5 ng/mL: Systemic infection (sepsis) is not likely. Local bacterial infection is possible. (NOTE)       Sepsis PCT Algorithm           Lower Respiratory Tract                                      Infection PCT Algorithm    ----------------------------     ----------------------------         PCT < 0.25 ng/mL                 PCT < 0.10 ng/mL         Strongly encourage             Strongly discourage   discontinuation of antibiotics    initiation of antibiotics    ----------------------------     -----------------------------       PCT 0.25 - 0.50 ng/mL            PCT 0.10 - 0.25 ng/mL               OR       >80% decrease in PCT            Discourage initiation of                                            antibiotics      Encourage discontinuation           of antibiotics    ----------------------------     -----------------------------         PCT >= 0.50 ng/mL              PCT 0.26 - 0.50 ng/mL               AND        <80% decrease in PCT             Encourage initiation of                                             antibiotics       Encourage continuation           of antibiotics    ----------------------------     -----------------------------        PCT >= 0.50 ng/mL                  PCT > 0.50 ng/mL               AND         increase in PCT                  Strongly encourage                                      initiation of antibiotics    Strongly encourage escalation           of antibiotics                                     -----------------------------  PCT <= 0.25 ng/mL                                                 OR                                        > 80% decrease in PCT                                     Discontinue / Do not initiate                                             antibiotics Performed at [facility], [address] [place], [place] [address]   Lactic acid, plasma     Status: Abnormal   Collection Time: 04/10/18  9:57 AM  Result Value Ref Range   Lactic Acid, Venous 2.6 (HH) 0.5 - 1.9 mmol/L    Comment: CRITICAL RESULT CALLED TO, READ BACK BY AND VERIFIED WITH: ROGERS,A AT 10:35AM ON 04/10/18 BY [facility] Performed at [facility], [address], [place], [place] [address]   Culture, blood (Routine X 2) w Reflex  to ID Panel     Status: None (Preliminary result)   Collection Time: 04/10/18  9:57 AM  Result Value Ref Range   Specimen Description BLOOD RIGHT ANTECUBITAL    Special Requests      BOTTLES DRAWN AEROBIC AND ANAEROBIC Blood Culture adequate volume   Culture      NO GROWTH < 24 HOURS Performed at [facility], [address] [place], [place] [address]    Report Status PENDING   Culture, blood (Routine X 2) w Reflex to ID Panel     Status: None (Preliminary result)   Collection Time: 04/10/18 10:05 AM  Result Value Ref Range   Specimen Description BLOOD BLOOD RIGHT FOREARM    Special Requests      BOTTLES DRAWN AEROBIC AND ANAEROBIC Blood Culture adequate volume   Culture      NO GROWTH < 24 HOURS Performed at [facility], [address] [place], [place] [address]    Report Status PENDING   Urinalysis, Complete w Microscopic     Status: Abnormal   Collection Time: 04/10/18 12:15 PM  Result Value Ref Range   Color, Urine YELLOW YELLOW   APPearance CLEAR CLEAR   Specific Gravity, Urine 1.018 1.005 - 1.030   pH 6.0 5.0 - 8.0   Glucose, UA NEGATIVE NEGATIVE mg/dL   Hgb urine dipstick SMALL (A) NEGATIVE   Bilirubin Urine NEGATIVE NEGATIVE   Ketones, ur 5 (A) NEGATIVE mg/dL   Protein, ur NEGATIVE NEGATIVE mg/dL   Nitrite NEGATIVE NEGATIVE   Leukocytes, UA NEGATIVE NEGATIVE   RBC / HPF 21-50 0 - 5 RBC/hpf   WBC, UA 0-5 0 - 5 WBC/hpf   Bacteria, UA NONE SEEN NONE SEEN   Squamous Epithelial / LPF 0-5 0 - 5   Mucus PRESENT     Comment: Performed at [facility], [address], [place], [place] [address]  Lactic acid, plasma  Status: None   Collection Time: 04/10/18  1:05 PM  Result Value Ref Range   Lactic Acid, Venous 1.5 0.5 - 1.9 mmol/L    Comment: Performed at [facility], [address], [place], [place] [address]  Basic metabolic panel     Status: Abnormal   Collection Time: 04/11/18  5:35 AM  Result Value Ref Range   Sodium 143 135 - 145 mmol/L   Potassium 3.6 3.5 -  5.1 mmol/L   Chloride 110 98 - 111 mmol/L   CO2 26 22 - 32 mmol/L   Glucose, Bld 79 70 - 99 mg/dL   BUN 11 8 - 23 mg/dL   Creatinine, Ser 0.38 (L) 0.44 - 1.00 mg/dL   Calcium 7.8 (L) 8.9 - 10.3 mg/dL   GFR calc non Af Amer >60 >60 mL/min   GFR calc Af Amer >60 >60 mL/min    Comment: (NOTE) The eGFR has been calculated using the CKD EPI equation. This calculation has not been validated in all clinical situations. eGFR's persistently <60 mL/min signify possible Chronic Kidney Disease.    Anion gap 7 5 - 15    Comment: Performed at [facility], [address] [place], [place] [address]  CBC     Status: Abnormal   Collection Time: 04/11/18  5:35 AM  Result Value Ref Range   WBC 6.0 4.0 - 10.5 K/uL   RBC 2.76 (L) 3.87 - 5.11 MIL/uL   Hemoglobin 8.4 (L) 12.0 - 15.0 g/dL   HCT 28.2 (L) 36.0 - 46.0 %   MCV 102.2 (H) 80.0 - 100.0 fL   MCH 30.4 26.0 - 34.0 pg   MCHC 29.8 (L) 30.0 - 36.0 g/dL   RDW 14.6 11.5 - 15.5 %   Platelets 193 150 - 400 K/uL   nRBC 0.0 0.0 - 0.2 %    Comment: Performed at [facility], [address], [place], [place] [address]  Procalcitonin     Status: None   Collection Time: 04/11/18  5:35 AM  Result Value Ref Range   Procalcitonin <0.10 ng/mL    Comment:        Interpretation: PCT (Procalcitonin) <= 0.5 ng/mL: Systemic infection (sepsis) is not likely. Local bacterial infection is possible. (NOTE)       Sepsis PCT Algorithm           Lower Respiratory Tract                                      Infection PCT Algorithm    ----------------------------     ----------------------------         PCT < 0.25 ng/mL                PCT < 0.10 ng/mL         Strongly encourage             Strongly discourage   discontinuation of antibiotics    initiation of antibiotics    ----------------------------     -----------------------------       PCT 0.25 - 0.50 ng/mL            PCT 0.10 - 0.25 ng/mL               OR       >80% decrease in PCT            Discourage  initiation of  antibiotics      Encourage discontinuation           of antibiotics    ----------------------------     -----------------------------         PCT >= 0.50 ng/mL              PCT 0.26 - 0.50 ng/mL               AND        <80% decrease in PCT             Encourage initiation of                                             antibiotics       Encourage continuation           of antibiotics    ----------------------------     -----------------------------        PCT >= 0.50 ng/mL                  PCT > 0.50 ng/mL               AND         increase in PCT                  Strongly encourage                                      initiation of antibiotics    Strongly encourage escalation           of antibiotics                                     -----------------------------                                           PCT <= 0.25 ng/mL                                                 OR                                        > 80% decrease in PCT                                     Discontinue / Do not initiate                                             antibiotics Performed at [facility], [address] [place], [place] [address]     Studies/Results:     Reina Wilton A. Merlene Laughter, M.D.  Diplomate, Tax adviser of  Psychiatry and Neurology ( Neurology). 04/11/2018, 8:26 PM

## 2018-04-12 ENCOUNTER — Encounter: Payer: Self-pay | Admitting: Internal Medicine

## 2018-04-12 ENCOUNTER — Inpatient Hospital Stay (HOSPITAL_COMMUNITY)
Admission: RE | Admit: 2018-04-12 | Discharge: 2018-04-12 | Disposition: A | Discharge status: Home / Self Care | Payer: Medicaid Other | Source: Ambulatory Visit | Attending: Internal Medicine | Admitting: Internal Medicine

## 2018-04-12 DIAGNOSIS — Z7189 Other specified counseling: Secondary | ICD-10-CM

## 2018-04-12 DIAGNOSIS — I9589 Other hypotension: Secondary | ICD-10-CM

## 2018-04-12 DIAGNOSIS — Z515 Encounter for palliative care: Secondary | ICD-10-CM

## 2018-04-12 DIAGNOSIS — R58 Hemorrhage, not elsewhere classified: Secondary | ICD-10-CM

## 2018-04-12 LAB — BASIC METABOLIC PANEL
ANION GAP: 8 (ref 5–15)
BUN: 6 mg/dL — AB (ref 8–23)
CALCIUM: 7.6 mg/dL — AB (ref 8.9–10.3)
CO2: 24 mmol/L (ref 22–32)
Chloride: 107 mmol/L (ref 98–111)
Creatinine, Ser: 0.39 mg/dL — ABNORMAL LOW (ref 0.44–1.00)
GFR calc Af Amer: >60 mL/min (ref >60)
GFR calc non Af Amer: >60 mL/min (ref >60)
GLUCOSE: 83 mg/dL (ref 70–99)
Potassium: 3.7 mmol/L (ref 3.5–5.1)
Sodium: 139 mmol/L (ref 135–145)

## 2018-04-12 LAB — CBC
HCT: 30.4 % — ABNORMAL LOW (ref 36.0–46.0)
Hemoglobin: 9.1 g/dL — ABNORMAL LOW (ref 12.0–15.0)
MCH: 30.6 pg (ref 26.0–34.0)
MCHC: 29.9 g/dL — ABNORMAL LOW (ref 30.0–36.0)
MCV: 102.4 fL — AB (ref 80.0–100.0)
PLATELETS: 226 10*3/uL (ref 150–400)
RBC: 2.97 MIL/uL — AB (ref 3.87–5.11)
RDW: 14.7 % (ref 11.5–15.5)
WBC: 5.7 10*3/uL (ref 4.0–10.5)
nRBC: 0 % (ref 0.0–0.2)

## 2018-04-12 LAB — URINE CULTURE: CULTURE: NO GROWTH

## 2018-04-12 LAB — CK: Total CK: 65 U/L (ref 38–234)

## 2018-04-12 LAB — PROCALCITONIN: Procalcitonin: <0.1 ng/mL

## 2018-04-12 MED ORDER — HYDRALAZINE HCL 20 MG/ML IJ SOLN
5.0000 mg | Freq: Once | INTRAMUSCULAR | Status: AC
  Administered 2018-04-12: 5 mg via INTRAVENOUS
  Filled 2018-04-12: qty 1

## 2018-04-12 MED ORDER — AMLODIPINE BESYLATE 5 MG PO TABS
5.0000 mg | ORAL_TABLET | Freq: Every day | ORAL | Status: DC
  Administered 2018-04-12: 5 mg via ORAL
  Filled 2018-04-12: qty 1

## 2018-04-12 MED ORDER — HYDRALAZINE HCL 20 MG/ML IJ SOLN: 5.0000 mg | Freq: Four times a day (QID) | INTRAMUSCULAR | Status: DC | PRN

## 2018-04-12 NOTE — Procedures (Signed)
  [facility] [place] A. [name], MD     [email or web address]           HISTORY: The patient is 62 year old who has a history of seizures.  She presents with shaking of the left upper extremity worrisome for seizures.  MEDICATIONS: Scheduled Meds: . amLODipine  5 mg Oral Daily  . divalproex  750 mg Oral BID  . hydrALAZINE  5 mg Intravenous Once  . polyethylene glycol  17 g Oral BID   Continuous Infusions: . 0.9 % NaCl with KCl 20 mEq / L 50 mL/hr at 04/12/18 1000  . levETIRAcetam Stopped (04/12/18 0838)   PRN Meds:.acetaminophen **OR** acetaminophen, hydrALAZINE, LORazepam, ondansetron **OR** ondansetron (ZOFRAN) IV  Prior to Admission medications   Medication Sig Start Date End Date Taking? Authorizing Provider  acetaminophen (TYLENOL) 325 MG tablet Take by mouth every 6 (six) hours as needed for mild pain.   Yes [provider]  aspirin EC 81 MG tablet Take 1 tablet (81 mg total) by mouth daily. 09/28/17  Yes Tat, Onalee Hua, MD  calcium carbonate (TUMS - DOSED IN MG ELEMENTAL CALCIUM) 500 MG chewable tablet Chew 1 tablet by mouth daily.   Yes [provider]  divalproex (DEPAKOTE) 250 MG DR tablet Take 750 mg by mouth 2 (two) times daily.    Yes [provider]  famotidine (PEPCID) 20 MG tablet Take 20 mg by mouth daily.   Yes [provider]  levETIRAcetam (KEPPRA) 500 MG tablet Take 500 mg by mouth 2 (two) times daily.   Yes [provider]  mirtazapine (REMERON) 15 MG tablet Take 15 mg by mouth at bedtime.   Yes [provider]  sertraline (ZOLOFT) 50 MG tablet Take 50 mg by mouth daily.   Yes [provider]  tiZANidine (ZANAFLEX) 2 MG tablet Take by mouth 2 (two) times daily.   Yes [provider]  traMADol (ULTRAM) 50 MG tablet Take by mouth every 6 (six) hours as needed for moderate pain.   Yes [provider]  Vitamin D, Ergocalciferol, (DRISDOL) 50000 units CAPS capsule Take 50,000 Units  by mouth every 7 (seven) days.   Yes [provider]      ANALYSIS: A 16 channel recording using standard 10 20 measurements is conducted for 23 minutes.   The background activity gets as high as 7-is 8 hertz.  There is beta activity observed in the in the frontal areas.  The recording is replete with spindles likely due to benzodiazepine effect.  There is occasional myogenic artifact seen throughout the recording.  There is no focal or lateralized slowing.  There is no epileptiform activity is observed.   IMPRESSION: 1.  Mild the global slowing indicating a mild global encephalopathy. 2.  Excessive spindling which is seen typically in benzodiazepine use.      [name] A. [name], M.D.  Diplomate, Biomedical engineer of Psychiatry and Neurology ( Neurology).

## 2018-04-12 NOTE — Progress Notes (Signed)
Bedside EEG complete; results pending. 

## 2018-04-12 NOTE — Progress Notes (Signed)
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 The patient was seen as an outpatient at [facility] on 02/27/2018 for heme positive stool. The patient has been more deconditioned since suffering sepsis from E. coli bacteremia and pyelonephritis with resultant CDIin March 2019. She was noted to have heme positive stools and sent to gastroenterology. Her hemoglobin was noted to be 11.3 on 09/28/2017. On 11/22/2017, the patient's hemoglobin was 12.1. Since that time, the patient had her hemoglobin rechecked on 04/08/2018 and it was noted to be 8.6. As result, the risks, benefits, and alternatives were discussed with the patient regarding colonoscopy, and the patient's sister want to pursue colonoscopy. Because of the patient's comorbidities, it was felt that the patient will be better served being admitted to the hospital for preparation and the colonoscopy itself. Apparently, the patient has been following neurology at [facility]. She has been cleared by neurology for colonoscopy.  Assessment/Plan: Acute blood loss anemia/Hemoccult positive stool -GI consulted--plan for colonoscopy delayed for later date due to retroperitoneal bleed -Check CBC--presenting hemoglobin 9.6-->remained stable -Check coags--unremarkable -colonoscopy cancelled until later time due to retroperitoneal bleed  Retroperitoneal bleed -04/10/2018 CT abdomen--right retroperitoneal  hemorrhage of uncertain etiology; minimal bladder wall thickening versus artifact; marked increase stool in the rectum and distal sigmoid colon -General surgery consulted--> supportive care, monitor hemoglobin -Drop in hemoglobin dilutional-->remained stable thereafter -later in hospitalization, it was revealed pt had femoral venous or arterial accessed at [facility] about 3-4 weeks prior to this hospitalization -this was likely the cause of her retroperitoneal bleed -case discussed with Dr. Andee Lineman care as pt is stable  Hypotension -am cortisol--29.4 -IVF--1.5 L bolus and started  -CBC--presenting hemoglobin 9.6 -lactic acid 2.6 -procalcitonin<0.10 -fluid bolusand start maintenance IVF -blood cultures x 2 sets--neg -CXR personally reviewed--bibasilar atelectasis -UA neg for pyuria -resolved  Constipation -abd pain improved after MOM enema -multiple BMs after MOM enema  Seizure disorder -Continue Depakote -Check Depakote level--96 -neurology consult appreciated--agrees with continuing keppra and VPA and EEG -EEG as pt intermittently having left arm "twitching"  Essential hypertension -no longer on BP meds -work up for hypotension as above  Right hemiplegia--residual effect of stroke -PT evaluation -holdingaspirin for GI work up and retroperitoneal bleed  Vascular Dementia -at risk for hospital delirium     Disposition Plan:   SNF 10/12 if stable Family Communication:  No Family at bedside  Consultants:  GI, general surgery, neurology  Code Status:  FULL  DVT Prophylaxis:  SCDs   Procedures: As Listed in Progress Note Above  Antibiotics: None   Subjective: Patient denies fevers, chills, headache, chest pain, dyspnea, nausea, vomiting, diarrhea, abdominal pain, dysuria, hematuria, hematochezia, and melena.   Objective: Vitals:   04/12/18 0528 04/12/18 1409 04/12/18 1424 04/12/18 1429  BP: 119/60 (!) 231/195 (!) 190/97 (!) 186/90   Pulse: 83 91    Resp:  16    Temp: 98.5 F (36.9 C) 97.6 F (36.4 C)    TempSrc: Oral     SpO2: 98% 96%    Weight:      Height:        Intake/Output  Summary (Last 24 hours) at 04/12/2018 1448 Last data filed at 04/12/2018 1048 Gross per 24 hour  Intake 2759.21 ml  Output 1300 ml  Net 1459.21 ml   Weight change:  Exam:   General:  Pt is alert, follows commands appropriately, not in acute distress  HEENT: No icterus, No thrush, No neck mass, [place]/AT  Cardiovascular: RRR, S1/S2, no rubs, no gallops  Respiratory: bibasilar rales, no wheeze  Abdomen: Soft/+BS, non tender, non distended, no guarding  Extremities: No edema, No lymphangitis, No petechiae, No rashes, no synovitis   Data Reviewed: I have personally reviewed following labs and imaging studies Basic Metabolic Panel: Recent Labs  Lab 04/10/18 0902 04/11/18 0535 04/12/18 0551  NA 139 143 139  K 3.6 3.6 3.7  CL 101 110 107  CO2 25 26 24   GLUCOSE 105* 79 83  BUN 19 11 6*  CREATININE 0.64 0.38* 0.39*  CALCIUM 8.5* 7.8* 7.6*   Liver Function Tests: No results for input(s): AST, ALT, ALKPHOS, BILITOT, PROT, ALBUMIN in the last 168 hours. No results for input(s): LIPASE, AMYLASE in the last 168 hours. No results for input(s): AMMONIA in the last 168 hours. Coagulation Profile: Recent Labs  Lab 04/10/18 0905  INR 1.03   CBC: Recent Labs  Lab 04/10/18 0902 04/11/18 0535 04/12/18 0551  WBC 9.1 6.0 5.7  HGB 9.6* 8.4* 9.1*  HCT 32.7* 28.2* 30.4*  MCV 101.6* 102.2* 102.4*  PLT 245 193 226   Cardiac Enzymes: Recent Labs  Lab 04/12/18 0551  CKTOTAL 65   BNP: Invalid input(s): POCBNP CBG: No results for input(s): GLUCAP in the last 168 hours. HbA1C: No results for input(s): HGBA1C in the last 72 hours. Urine analysis:    Component Value Date/Time   COLORURINE YELLOW 04/10/2018 1215   APPEARANCEUR CLEAR 04/10/2018 1215   LABSPEC 1.018 04/10/2018 1215   PHURINE 6.0 04/10/2018 1215    GLUCOSEU NEGATIVE 04/10/2018 1215   HGBUR SMALL (A) 04/10/2018 1215   BILIRUBINUR NEGATIVE 04/10/2018 1215   KETONESUR 5 (A) 04/10/2018 1215   PROTEINUR NEGATIVE 04/10/2018 1215   NITRITE NEGATIVE 04/10/2018 1215   LEUKOCYTESUR NEGATIVE 04/10/2018 1215   Sepsis Labs: @LABRCNTIP (procalcitonin:4,lacticidven:4) ) Recent Results (from the past 240 hour(s))  Culture, blood (Routine X 2) w Reflex to ID Panel     Status: None (Preliminary result)   Collection Time: 04/10/18  9:57 AM  Result Value Ref Range Status   Specimen Description BLOOD RIGHT ANTECUBITAL  Final   Special Requests   Final    BOTTLES DRAWN AEROBIC AND ANAEROBIC Blood Culture adequate volume   Culture   Final    NO GROWTH 2 DAYS Performed at [facility], [address], [place], [place] [address]    Report Status PENDING  Incomplete  Culture, blood (Routine X 2) w Reflex to ID Panel     Status: None (Preliminary result)   Collection Time: 04/10/18 10:05 AM  Result Value Ref Range Status   Specimen Description BLOOD BLOOD RIGHT FOREARM  Final   Special Requests   Final    BOTTLES DRAWN AEROBIC AND ANAEROBIC Blood Culture adequate volume   Culture   Final    NO GROWTH 2 DAYS Performed at [facility], [address], [place], [place] [address]    Report Status PENDING  Incomplete  Culture, Urine     Status: None   Collection Time: 04/10/18 12:19 PM  Result Value Ref Range Status   Specimen Description   Final    URINE,  CATHETERIZED Performed at [facility], [address], [place], [place] [address]    Special Requests   Final    NONE Performed at [facility], [address] [place], [place] [address]    Culture   Final    NO GROWTH Performed at [facility], [address], [place], [place] [address]    Report Status 04/12/2018 FINAL  Final     Scheduled Meds: . amLODipine  5 mg Oral Daily  . divalproex  750 mg Oral BID  . hydrALAZINE  5 mg Intravenous Once  . polyethylene glycol  17 g Oral BID    Continuous Infusions: . 0.9 % NaCl with KCl 20 mEq / L 50 mL/hr at 04/12/18 1000  . levETIRAcetam Stopped (04/12/18 9147)    Procedures/Studies: Ct Abdomen Pelvis W Contrast  Result Date: 04/10/2018 CLINICAL DATA:  RIGHT lower quadrant pain, hypotension, fall in hemoglobin, acute on chronic anemia, elevated lactic acid; history LEFT spastic hemiplegia, prior C difficile colitis EXAM: CT ABDOMEN AND PELVIS WITH CONTRAST TECHNIQUE: Multidetector CT imaging of the abdomen and pelvis was performed using the standard protocol following bolus administration of intravenous contrast. Sagittal and coronal MPR images reconstructed from axial data set. CONTRAST:  ISOVUE-300 IOPAMIDOL (ISOVUE-300) INJECTION 61% IV. Dilute oral contrast. COMPARISON:  09/13/2017 FINDINGS: Lower chest: Minimal LEFT pleural effusion and bibasilar atelectasis Hepatobiliary: Gallbladder and liver unremarkable Pancreas: Normal appearance Spleen: Normal appearance Adrenals/Urinary Tract: Adrenal glands normal appearance. Small BILATERAL renal cysts. Kidneys and ureters normal appearance. Question wall thickening of the RIGHT anterolateral bladder wall, see below. Stomach/Bowel: Normal appendix. Markedly increased stool in rectum and distal sigmoid colon, rectum up to 8.5 cm diameter. Minimal rectal wall thickening, decreased from previous exam. Scattered stool throughout remainder of colon. Stomach decompressed. Remaining bowel loops unremarkable. Vascular/Lymphatic: Atherosclerotic calcifications aorta, iliac arteries, common femoral arteries, coronary arteries. No adenopathy. Reproductive: Atrophic uterus and ovaries Other: Diffuse intermediate attenuation infiltrative changes are identified in the RIGHT retroperitoneum beginning inferior to the RIGHT kidney at the posterior pararenal fascia and extending into the anterior RIGHT pelvis, with some extension into the RIGHT lateral side wall and into the RIGHT anterior and lateral  prevesical space compatible with retroperitoneal hemorrhage. Areas of hemorrhage measure up to 6.1 x 2.3 cm at the base of the RIGHT pericolic gutter and 6.2 x 2.0 cm at the RIGHT prevesical space. Hemorrhage overall approximately 12.5 cm length. Associated mild thickening of the anterolateral RIGHT bladder wall. Source of hemorrhage is uncertain; no aneurysm or mass identified, and no etiology arising from the inferior RIGHT kidney or RIGHT psoas muscle identified. No ascites or free air. Mild stranding and presacral space, nonspecific. No hernia. Musculoskeletal: Scattered subcutaneous edema at the flanks. Bones demineralized. IMPRESSION: RIGHT retroperitoneal hemorrhage in the pelvis as above, of uncertain etiology, maximal dimensions of 12.5 cm length, 2.2 cm thick, and 6.2 cm transverse. Question minimal bladder wall thickening versus artifact in artifact at the anterolateral RIGHT urinary bladder. Markedly increased stool in rectum and distal sigmoid colon. Bibasilar atelectasis and small LEFT pleural effusion. Findings discussed with Dr. Jena Gauss on 04/10/2018 at 1440 hours. Electronically Signed   By: Ulyses Southward M.D.   On: 04/10/2018 14:54   Dg Chest Port 1 View  Result Date: 04/10/2018 CLINICAL DATA:  Acute onset shortness of breath. EXAM: PORTABLE CHEST 1 VIEW COMPARISON:  09/13/2017. FINDINGS: Markedly suboptimal inspiration with linear atelectasis at both lung bases. Lungs otherwise clear. Pulmonary vascularity normal. No pleural effusions. Cardiac silhouette normal  in size for AP portable technique. IMPRESSION: Markedly suboptimal inspiration which accounts for atelectasis at the lung bases. No acute cardiopulmonary disease otherwise. Electronically Signed   By: Hulan Saas M.D.   On: 04/10/2018 11:43    Catarina Hartshorn, DO  [facility] Pager [phone]  If 7PM-7AM, please contact night-coverage [email or web address] Password [email or web address] 04/12/2018, 2:48 PM   LOS: 2 days

## 2018-04-12 NOTE — Telephone Encounter (Signed)
PATIENT SCHEDULED AND LETTER SET

## 2018-04-12 NOTE — Progress Notes (Signed)
Subjective: No new abdominal pain.  Objective: Vital signs in last 24 hours: Temp:  [98.5 F (36.9 C)-99.3 F (37.4 C)] 98.5 F (36.9 C) (10/11 0528) Pulse Rate:  [83-93] 83 (10/11 0528) Resp:  [20] 20 (10/10 1418) BP: (119-165)/(60-96) 119/60 (10/11 0528) SpO2:  [85 %-99 %] 98 % (10/11 0528) Last BM Date: 04/11/18  Intake/Output from previous day: 10/10 0701 - 10/11 0700 In: 580 [P.O.:580] Out: 750 [Urine:750] Intake/Output this shift: Total I/O In: 2519.2 [P.O.:240; I.V.:2179.2; IV Piggyback:100] Out: 550 [Urine:550]  GI: soft, non-tender; bowel sounds normal; no masses,  no organomegaly  Lab Results:  Recent Labs    04/11/18 0535 04/12/18 0551  WBC 6.0 5.7  HGB 8.4* 9.1*  HCT 28.2* 30.4*  PLT 193 226   BMET Recent Labs    04/11/18 0535 04/12/18 0551  NA 143 139  K 3.6 3.7  CL 110 107  CO2 26 24  GLUCOSE 79 83  BUN 11 6*  CREATININE 0.38* 0.39*  CALCIUM 7.8* 7.6*   PT/INR Recent Labs    04/10/18 0905  LABPROT 13.4  INR 1.03    Studies/Results: Ct Abdomen Pelvis W Contrast  Result Date: 04/10/2018 CLINICAL DATA:  RIGHT lower quadrant pain, hypotension, fall in hemoglobin, acute on chronic anemia, elevated lactic acid; history LEFT spastic hemiplegia, prior C difficile colitis EXAM: CT ABDOMEN AND PELVIS WITH CONTRAST TECHNIQUE: Multidetector CT imaging of the abdomen and pelvis was performed using the standard protocol following bolus administration of intravenous contrast. Sagittal and coronal MPR images reconstructed from axial data set. CONTRAST:  ISOVUE-300 IOPAMIDOL (ISOVUE-300) INJECTION 61% IV. Dilute oral contrast. COMPARISON:  09/13/2017 FINDINGS: Lower chest: Minimal LEFT pleural effusion and bibasilar atelectasis Hepatobiliary: Gallbladder and liver unremarkable Pancreas: Normal appearance Spleen: Normal appearance Adrenals/Urinary Tract: Adrenal glands normal appearance. Small BILATERAL renal cysts. Kidneys and ureters normal  appearance. Question wall thickening of the RIGHT anterolateral bladder wall, see below. Stomach/Bowel: Normal appendix. Markedly increased stool in rectum and distal sigmoid colon, rectum up to 8.5 cm diameter. Minimal rectal wall thickening, decreased from previous exam. Scattered stool throughout remainder of colon. Stomach decompressed. Remaining bowel loops unremarkable. Vascular/Lymphatic: Atherosclerotic calcifications aorta, iliac arteries, common femoral arteries, coronary arteries. No adenopathy. Reproductive: Atrophic uterus and ovaries Other: Diffuse intermediate attenuation infiltrative changes are identified in the RIGHT retroperitoneum beginning inferior to the RIGHT kidney at the posterior pararenal fascia and extending into the anterior RIGHT pelvis, with some extension into the RIGHT lateral side wall and into the RIGHT anterior and lateral prevesical space compatible with retroperitoneal hemorrhage. Areas of hemorrhage measure up to 6.1 x 2.3 cm at the base of the RIGHT pericolic gutter and 6.2 x 2.0 cm at the RIGHT prevesical space. Hemorrhage overall approximately 12.5 cm length. Associated mild thickening of the anterolateral RIGHT bladder wall. Source of hemorrhage is uncertain; no aneurysm or mass identified, and no etiology arising from the inferior RIGHT kidney or RIGHT psoas muscle identified. No ascites or free air. Mild stranding and presacral space, nonspecific. No hernia. Musculoskeletal: Scattered subcutaneous edema at the flanks. Bones demineralized. IMPRESSION: RIGHT retroperitoneal hemorrhage in the pelvis as above, of uncertain etiology, maximal dimensions of 12.5 cm length, 2.2 cm thick, and 6.2 cm transverse. Question minimal bladder wall thickening versus artifact in artifact at the anterolateral RIGHT urinary bladder. Markedly increased stool in rectum and distal sigmoid colon. Bibasilar atelectasis and small LEFT pleural effusion. Findings discussed with Dr. Jena Gauss on  04/10/2018 at 1440 hours. Electronically Signed   By: Loraine Leriche  Tyron Russell M.D.   On: 04/10/2018 14:54    Anti-infectives: Anti-infectives (From admission, onward)   None      Assessment/Plan: Impression: In talking with Dr. Arbutus Leas, he informed me that the patient had undergone a femoral arterial catheterization for an arterial study of the brain.  This was done 3 to 4 weeks ago at [facility].  That would explain the patient's retroperitoneal hematoma.  It does not appear to be expanding.  Her hemoglobin is stable.  This should resolve with time.  No surgical intervention warranted.  Will sign off.  Please call me if I can be of further assistance.  LOS: 2 days    Lisa [name] 04/12/2018

## 2018-04-12 NOTE — Progress Notes (Signed)
Subjective: No abdominal pain, N/V this morning. No bowel movement since yesterday. No GI complaints.  Objective: Vital signs in last 24 hours: Temp:  [98.5 F (36.9 C)-99.3 F (37.4 C)] 98.5 F (36.9 C) (10/11 0528) Pulse Rate:  [83-93] 83 (10/11 0528) Resp:  [20] 20 (10/10 1418) BP: (119-165)/(60-96) 119/60 (10/11 0528) SpO2:  [85 %-99 %] 98 % (10/11 0528) Last BM Date: 04/11/18 General:   Alert and oriented, pleasant Head:  Normocephalic and atraumatic. Eyes:  No icterus, sclera clear. Conjuctiva pink.  Heart:  S1, S2 present, no murmurs noted.  Lungs: Clear to auscultation bilaterally, without wheezing, rales, or rhonchi.  Abdomen:  Bowel sounds present, soft, non-tender, non-distended. No HSM or hernias noted. No rebound or guarding. No masses appreciated  Msk:  Symmetrical without gross deformities. Pulses:  Normal bilateral DP pulses noted. Extremities:  Without clubbing or edema. Neurologic:  Alert and  oriented x4;  grossly normal neurologically. Psych:  Alert and cooperative. Normal mood and affect.  Intake/Output from previous day: 10/10 0701 - 10/11 0700 In: 580 [P.O.:580] Out: 750 [Urine:750] Intake/Output this shift: Total I/O In: 2519.2 [P.O.:240; I.V.:2179.2; IV Piggyback:100] Out: 550 [Urine:550]  Lab Results: Recent Labs    04/10/18 0902 04/11/18 0535 04/12/18 0551  WBC 9.1 6.0 5.7  HGB 9.6* 8.4* 9.1*  HCT 32.7* 28.2* 30.4*  PLT 245 193 226   BMET Recent Labs    04/10/18 0902 04/11/18 0535 04/12/18 0551  NA 139 143 139  K 3.6 3.6 3.7  CL 101 110 107  CO2 25 26 24   GLUCOSE 105* 79 83  BUN 19 11 6*  CREATININE 0.64 0.38* 0.39*  CALCIUM 8.5* 7.8* 7.6*   LFT No results for input(s): PROT, ALBUMIN, AST, ALT, ALKPHOS, BILITOT, BILIDIR, IBILI in the last 72 hours. PT/INR Recent Labs    04/10/18 0905  LABPROT 13.4  INR 1.03   Hepatitis Panel No results for input(s): HEPBSAG, HCVAB, HEPAIGM, HEPBIGM in the last 72  hours.   Studies/Results: Ct Abdomen Pelvis W Contrast  Result Date: 04/10/2018 CLINICAL DATA:  RIGHT lower quadrant pain, hypotension, fall in hemoglobin, acute on chronic anemia, elevated lactic acid; history LEFT spastic hemiplegia, prior C difficile colitis EXAM: CT ABDOMEN AND PELVIS WITH CONTRAST TECHNIQUE: Multidetector CT imaging of the abdomen and pelvis was performed using the standard protocol following bolus administration of intravenous contrast. Sagittal and coronal MPR images reconstructed from axial data set. CONTRAST:  ISOVUE-300 IOPAMIDOL (ISOVUE-300) INJECTION 61% IV. Dilute oral contrast. COMPARISON:  09/13/2017 FINDINGS: Lower chest: Minimal LEFT pleural effusion and bibasilar atelectasis Hepatobiliary: Gallbladder and liver unremarkable Pancreas: Normal appearance Spleen: Normal appearance Adrenals/Urinary Tract: Adrenal glands normal appearance. Small BILATERAL renal cysts. Kidneys and ureters normal appearance. Question wall thickening of the RIGHT anterolateral bladder wall, see below. Stomach/Bowel: Normal appendix. Markedly increased stool in rectum and distal sigmoid colon, rectum up to 8.5 cm diameter. Minimal rectal wall thickening, decreased from previous exam. Scattered stool throughout remainder of colon. Stomach decompressed. Remaining bowel loops unremarkable. Vascular/Lymphatic: Atherosclerotic calcifications aorta, iliac arteries, common femoral arteries, coronary arteries. No adenopathy. Reproductive: Atrophic uterus and ovaries Other: Diffuse intermediate attenuation infiltrative changes are identified in the RIGHT retroperitoneum beginning inferior to the RIGHT kidney at the posterior pararenal fascia and extending into the anterior RIGHT pelvis, with some extension into the RIGHT lateral side wall and into the RIGHT anterior and lateral prevesical space compatible with retroperitoneal hemorrhage. Areas of hemorrhage measure up to 6.1 x 2.3 cm at the  base of the  RIGHT pericolic gutter and 6.2 x 2.0 cm at the RIGHT prevesical space. Hemorrhage overall approximately 12.5 cm length. Associated mild thickening of the anterolateral RIGHT bladder wall. Source of hemorrhage is uncertain; no aneurysm or mass identified, and no etiology arising from the inferior RIGHT kidney or RIGHT psoas muscle identified. No ascites or free air. Mild stranding and presacral space, nonspecific. No hernia. Musculoskeletal: Scattered subcutaneous edema at the flanks. Bones demineralized. IMPRESSION: RIGHT retroperitoneal hemorrhage in the pelvis as above, of uncertain etiology, maximal dimensions of 12.5 cm length, 2.2 cm thick, and 6.2 cm transverse. Question minimal bladder wall thickening versus artifact in artifact at the anterolateral RIGHT urinary bladder. Markedly increased stool in rectum and distal sigmoid colon. Bibasilar atelectasis and small LEFT pleural effusion. Findings discussed with Dr. Jena Gauss on 04/10/2018 at 1440 hours. Electronically Signed   By: Ulyses Southward M.D.   On: 04/10/2018 14:54    Assessment: 62 year old female originally brought into the hospital for assistance with bowel preparation for colonoscopy to evaluate Hemoccult positive stool.  Last week hemoglobin was noted to be down 3 g (12.1 in 10/2017 to 8.6) from baseline without any overt bleeding noted.  When she presented 04/10/18 she had significant hypotension, complained of right lower quadrant pain which was new.  CT showed significant right retroperitoneal hemorrhage, etiology unclear with no gross vascular abnormalities.  No obvious bruising noted on exam, no reported trauma. Appreciate surgery input.  Hemoglobin relatively stable. Also noted to have significant stool in the rectum/rectosigmoid colon.  Received milk of molasses enemas with good results; needs to maintain MiraLAX twice daily daily, can reduce to once daily if develops diarrhea.  Colonoscopy has been postponed due to retroperitoneal hemorrhage  with plans to regroup at a later date.  Today she is clinically improved. No further abdominal pain. Hgb stable/improved (9.1 today compared to 8.4 yesterday). Tolerating a diet well.  Plan: 1. Continue MiraLAX to prevent constipation 2. Monitor H/H or for any s/s acute bleeding 3. Transfuse as necessary 4. We will see as outpatient for discussion of further colonoscopy 5. Supportive measures 6. We will follow peripherally   Thank you for allowing [place] to participate in the care of [facility]  Wynne Dust, DNP, AGNP-C Adult & Gerontological Nurse Practitioner [facility]     LOS: 2 days    04/12/2018, 1:07 PM

## 2018-04-12 NOTE — Progress Notes (Signed)
Palliative: [name] [name], [name], is lying quietly in bed.  EEG tech is at bedside.  There is no family at bedside at this time.  Lisa [name] [name] denies issues or concerns, and as usual oriented to self only.   Call to Lisa [name] [name].  We discussed Lisa [name] [name]'s labs in detail including stabilized hemoglobin.  We also talked about nutritional status, and low albumin at 2.5.  Lisa [name] [name] shares that she brings "Ensure Plus" to Lisa [name] [name], enough for her to have 1 or 2 a day.  We talked about the normal changes in the body including the changes we see the hair and skin, there can also be changes inside the body.  I share that sometimes nutritional supplements are not enough to improve nutritional status, people can become malnourished.    We talked about her episode of "shaking" yesterday.  I share that neurology has been consulted, and EEG has just been completed, no results at this time.  Lisa [name] [name] shares that this is not unusual, shaking/seizure-like activity.  She shares that when this happens to Lisa [name] [name] at the [name], they "rush her to the [name]".  She shares that by the time Lisa [name] [name] gets to the [name] the seizure activity is over.  We talked about the effort of going to the [name], the risk for infection.  Lisa [name] [name] and I talk about how to care for Lisa [name] [name] in her facility when she has seizure-like activity again.  Lisa [name] [name] states that she will work with in facility doctors for lab draws or examinations.  Lisa [name] [name] goes further to state that when she was recently seen at [name] for invasive aneurysmal coil testing (through her groin) the doctor there told her that the spasms will continue to happen, they are normal part of her aneurysm/stroke.  Lisa [name] [name] shares that the doctor told her as long as they did not occur more than 6-7 times a month, that he would consider them to be well managed.  Lisa [name] [name] shares that Lisa [name] [name] had invasive testing for her aneurysmal coil, groin cath, every 6 months.  Lisa [name]  share that she now no longer needs invasive testing, but yearly CT.  We also discussed GI bleed in detail.  Lisa [name] [name] states that Lisa [name] [name] was first noted to have bleeding in early summer.  She said there were no further occurrences and so the facility did not mention it again.  She goes further to share that their father died with colon cancer, and multiple relatives have also died with some type of cancer.  She shares that Lisa [name] [name] has never had a colonoscopy, she feels this would be beneficial.  I shared that Lisa [name] [name] is to follow-up with the GI provider in 3 to 4 weeks.  I encourage Lisa [name] [name] to work with Lisa [name] [name] for scheduling, in particular around the holidays.    We discussed the risks for invasive procedures such as colonoscopy.  I share that Lisa [name] [name] had a complication of retroperitoneal bleeding from catheterization at her groin, and I consider that she is also at risk for complications from colonoscopy, in particular if her nutritional status is low, malnutrition/malabsorption.  Lisa [name] [name] states that she had not considered this but she find this to be a valid concern.  We go further to discuss if polyps/tumors are found, would Lisa [name] [name] want/qualify for surgery, chemotherapy, radiation.  How would we care for her if/when she has worsening health problems.    Lisa [name] [name] shares that she will talk with Lisa [name] [name] about her choices.  She shares that Lisa [name] [name] has already  decided she does not want to attempt resuscitation.  We talked about her diagnosis of vascular dementia.  Lisa [name] [name] shares that her sister is normally oriented when she sees her at the facility, but a diagnosis of vascular dementia is new to her.  We talked about the many mental status exam, away for Lisa [name] [name] to determine Lisa [name] [name] orientation, ability to make decisions.  I shared that it may be time for Lisa [name] [name] to change from a sibling relationship to a parent/child relationship.  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  One brother has passed away, another brother is in [place], their sister recently lost her husband to bone cancer.  Lisa [name] [name] states that it is difficult for family to be close in caring for Lisa [name] [name].   I share that if Lisa [name] [name] hemoglobin remains stable, she would likely return to [name] in the next day or 2.  We discussed follow-up with GI for scheduling of colonoscopy, again encouraging her to consider choices for care.  Lisa [name] [name] asks about follow-up with a neurologist here at [name].  I share that our neurologist has seen her only in the [name], and that she is to follow-up with her regular neurologist as scheduled.    Lisa [name] [name] form completed.  Conference with nursing staff related to goals of care discussion, plan of care. Conference with hospitalist related to goals of care discussion, DNR status, plan of care.  50 minutes, extended time Lillia Carmel, NP Palliative Medicine Team Team Phone # [phone]  Greater than 50% of this time was spent counseling and coordinating care related to the above assessment and plan.

## 2018-04-13 DIAGNOSIS — G8114 Spastic hemiplegia affecting left nondominant side: Secondary | ICD-10-CM

## 2018-04-13 LAB — BASIC METABOLIC PANEL
Anion gap: 7 (ref 5–15)
BUN: <5 mg/dL — ABNORMAL LOW (ref 8–23)
CALCIUM: 8 mg/dL — AB (ref 8.9–10.3)
CO2: 27 mmol/L (ref 22–32)
CREATININE: 0.44 mg/dL (ref 0.44–1.00)
Chloride: 104 mmol/L (ref 98–111)
Glucose, Bld: 93 mg/dL (ref 70–99)
Potassium: 3.7 mmol/L (ref 3.5–5.1)
SODIUM: 138 mmol/L (ref 135–145)

## 2018-04-13 LAB — CBC
HCT: 31.8 % — ABNORMAL LOW (ref 36.0–46.0)
Hemoglobin: 9.4 g/dL — ABNORMAL LOW (ref 12.0–15.0)
MCH: 30.1 pg (ref 26.0–34.0)
MCHC: 29.6 g/dL — ABNORMAL LOW (ref 30.0–36.0)
MCV: 101.9 fL — AB (ref 80.0–100.0)
NRBC: 0 % (ref 0.0–0.2)
PLATELETS: 206 10*3/uL (ref 150–400)
RBC: 3.12 MIL/uL — ABNORMAL LOW (ref 3.87–5.11)
RDW: 15.2 % (ref 11.5–15.5)
WBC: 6.2 10*3/uL (ref 4.0–10.5)

## 2018-04-13 MED ORDER — AMLODIPINE BESYLATE 10 MG PO TABS: 10.0000 mg | ORAL_TABLET | Freq: Every day | ORAL | 1 refills | Status: DC

## 2018-04-13 MED ORDER — AMLODIPINE BESYLATE 5 MG PO TABS
10.0000 mg | ORAL_TABLET | Freq: Every day | ORAL | Status: DC
  Administered 2018-04-13: 10 mg via ORAL
  Filled 2018-04-13: qty 2

## 2018-04-13 MED ORDER — LEVETIRACETAM 500 MG PO TABS: 500.0000 mg | ORAL_TABLET | Freq: Two times a day (BID) | ORAL | Status: DC

## 2018-04-13 MED ORDER — HYDROCODONE-ACETAMINOPHEN 5-325 MG PO TABS: 1.0000 | ORAL_TABLET | Freq: Four times a day (QID) | ORAL | 0 refills | Status: DC | PRN

## 2018-04-13 MED ORDER — POLYETHYLENE GLYCOL 3350 17 G PO PACK: 17.0000 g | PACK | Freq: Two times a day (BID) | ORAL | 0 refills | Status: AC

## 2018-04-13 NOTE — Progress Notes (Signed)
CSW contacted by RN that patient has been discharged back to SNF. CSW contacted SNF and provided clinical information. SNF is ready to accept patient back.   Discharge to: [facility] Anticipated discharge date: 04/13/18 Transportation by: [facility] (RN to call for transport)  Report #: [phone]  CSW signing off.  Saundra Gin LCSW [phone]

## 2018-04-13 NOTE — Plan of Care (Signed)
Adequate for discharge.

## 2018-04-13 NOTE — Progress Notes (Signed)
Report called to [facility] at [facility] of [place], [place].

## 2018-04-13 NOTE — Discharge Summary (Signed)
Physician Discharge Summary  Lisa [name] ZOX:[id number] DOB: 1956/05/04 DOA: 04/10/2018  PCP: Audree Bane, DO  Admit date: 04/10/2018 Discharge date: 04/13/2018  Admitted From: SNF Disposition:  SNF  Recommendations for Outpatient Follow-up:  1. Follow up with PCP in 1-2 weeks 2. 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 The patient was seen as an outpatient at [facility] on 02/27/2018 for heme positive stool. The patient has been more deconditioned since suffering sepsis from E. coli bacteremia and pyelonephritis with resultant CDIin March 2019. She was noted to have heme positive stools and sent to gastroenterology. Her hemoglobin was noted to be 11.3 on 09/28/2017. On 11/22/2017, the patient's hemoglobin was 12.1. Since that time, the patient had her hemoglobin rechecked on 04/08/2018 and it was noted to be 8.6. As result, the risks, benefits, and alternatives were discussed with the patient regarding colonoscopy, and the patient's sister want to pursue colonoscopy. Because of the patient's comorbidities, it was felt that the patient will be better served being admitted to the hospital for preparation and the colonoscopy itself. Apparently, the patient has been following neurology at [facility]. She has been cleared by neurology for colonoscopy.  CT of the abdomen and pelvis was ordered at the time of admission.  Patient  had a right retroperitoneal hemorrhage initially of unclear etiology.  However, further history from the patient's sister revealed that the patient had a vascular procedure where her right femoral artery was accessed approximately 3 to 4 weeks prior to admission.  This likely represented the etiology of the patient's bleed.  The patient remained clinically stable and hemodynamically stable.  Hemoglobin remained stable.  During the hospitalization, the patient had a tremulous type activity in her upper extremities as well as her face.  There was concern for new seizure.  Keppra was added.  EEG was obtained and neurology was consulted.  They agreed with continuing Keppra.  EEG did not show any epileptiform activity.   Discharge Diagnoses:   Acute blood loss anemia/Hemoccult positive stool -GI consulted--plan for colonoscopy delayed for later date due to retroperitoneal bleed -Check CBC--presenting hemoglobin 9.6-->remained stable -Check coags--unremarkable -colonoscopy cancelled until later time due to retroperitoneal bleed -Hgb remained stable throughout the hospitalization -Hgb 9.4 on day of d/c  Retroperitoneal bleed -04/10/2018 CT abdomen--right retroperitoneal hemorrhage of uncertain etiology;minimal bladder wall thickening versus artifact;marked increase stool in the rectum and distal sigmoid colon -General surgery consulted-->supportive care, monitor hemoglobin -Drop in hemoglobin dilutional-->remained stable thereafter -later in hospitalization, it was revealed pt had femoral venous or arterial accessed at [facility] about 3-4 weeks prior to this hospitalization -this was likely the cause of her retroperitoneal bleed -case discussed with Dr. Andee Lineman care as pt is stable -Hgb 9.4 on day of d/c  Hypotension -am cortisol--29.4 -IVF--1.5 L bolus and started -CBC--presenting hemoglobin 9.6 -lactic acid2.6 -procalcitonin<0.10 -fluid bolusand start maintenance IVF -blood  cultures x 2 sets--neg -CXRpersonally reviewed--bibasilar atelectasis -UA neg for pyuria -resolved  Constipation -abd pain improved after MOM enema -multiple BMs after MOM enema  Seizure disorder -Continue Depakote -Check Depakote level--96 -neurology consult appreciated--agrees with continuing keppra and VPA and EEG -EEG--no  epileptiform activity  Essential hypertension -amlodipine started and titrated to 10 mg daily -work up for hypotension as above  Right hemiplegia--residual effect of stroke -PT evaluation -holdingaspirin for GI work upand retroperitoneal bleed  Vascular Dementia -at risk for hospital delirium   Discharge Instructions   Allergies as of 04/13/2018   No Known Allergies     Medication List    STOP taking these medications   traMADol 50 MG tablet Commonly known as:  ULTRAM     TAKE these medications   acetaminophen 325 MG tablet Commonly known as:  TYLENOL Take by mouth every 6 (six) hours as needed for mild pain.   amLODipine 10 MG tablet Commonly known as:  NORVASC Take 1 tablet (10 mg total) by mouth daily.   aspirin EC 81 MG tablet Take 1 tablet (81 mg total) by mouth daily.   calcium carbonate 500 MG chewable tablet Commonly known as:  TUMS - dosed in mg elemental calcium Chew 1 tablet by mouth daily.   divalproex 250 MG DR tablet Commonly known as:  DEPAKOTE Take 750 mg by mouth 2 (two) times daily.   famotidine 20 MG tablet Commonly known as:  PEPCID Take 20 mg by mouth daily.   HYDROcodone-acetaminophen 5-325 MG tablet Commonly known as:  NORCO/VICODIN Take 1 tablet by mouth every 6 (six) hours as needed for moderate pain.   levETIRAcetam 500 MG tablet Commonly known as:  KEPPRA Take 500 mg by mouth 2 (two) times daily.   mirtazapine 15 MG tablet Commonly known as:  REMERON Take 15 mg by mouth at bedtime.   polyethylene glycol packet Commonly known as:  MIRALAX / GLYCOLAX Take 17 g by mouth 2 (two) times  daily.   sertraline 50 MG tablet Commonly known as:  ZOLOFT Take 50 mg by mouth daily.   tiZANidine 2 MG tablet Commonly known as:  ZANAFLEX Take by mouth 2 (two) times daily.   Vitamin D (Ergocalciferol) 50000 units Caps capsule Commonly known as:  DRISDOL Take 50,000 Units by mouth every 7 (seven) days.       No Known Allergies  Consultations:  neurology   Procedures/Studies: Ct Abdomen Pelvis W Contrast  Result Date: 04/10/2018 CLINICAL DATA:  RIGHT lower quadrant pain, hypotension, fall in hemoglobin, acute on chronic anemia, elevated lactic acid; history LEFT spastic hemiplegia, prior C difficile colitis EXAM: CT ABDOMEN AND PELVIS WITH CONTRAST TECHNIQUE: Multidetector CT imaging of the abdomen and pelvis was performed using the standard protocol following bolus administration of intravenous contrast. Sagittal and coronal MPR images reconstructed from axial data set. CONTRAST:  ISOVUE-300 IOPAMIDOL (ISOVUE-300) INJECTION 61% IV. Dilute oral contrast. COMPARISON:  09/13/2017 FINDINGS: Lower chest: Minimal LEFT pleural effusion and bibasilar atelectasis Hepatobiliary: Gallbladder and liver unremarkable Pancreas: Normal appearance Spleen: Normal appearance Adrenals/Urinary Tract: Adrenal glands normal appearance. Small BILATERAL renal cysts. Kidneys and ureters normal appearance. Question wall thickening of the RIGHT anterolateral bladder wall, see below. Stomach/Bowel: Normal appendix. Markedly increased stool in rectum and distal sigmoid colon, rectum up to 8.5 cm diameter. Minimal rectal wall thickening, decreased from previous exam. Scattered stool throughout remainder of colon. Stomach decompressed. Remaining bowel loops unremarkable. Vascular/Lymphatic: Atherosclerotic calcifications aorta, iliac arteries, common femoral arteries, coronary arteries. No adenopathy. Reproductive: Atrophic uterus and ovaries Other: Diffuse intermediate attenuation infiltrative changes are  identified in the RIGHT retroperitoneum beginning inferior to the RIGHT kidney at the posterior pararenal fascia and extending into the anterior RIGHT pelvis, with some extension into the RIGHT lateral side wall  and into the RIGHT anterior and lateral prevesical space compatible with retroperitoneal hemorrhage. Areas of hemorrhage measure up to 6.1 x 2.3 cm at the base of the RIGHT pericolic gutter and 6.2 x 2.0 cm at the RIGHT prevesical space. Hemorrhage overall approximately 12.5 cm length. Associated mild thickening of the anterolateral RIGHT bladder wall. Source of hemorrhage is uncertain; no aneurysm or mass identified, and no etiology arising from the inferior RIGHT kidney or RIGHT psoas muscle identified. No ascites or free air. Mild stranding and presacral space, nonspecific. No hernia. Musculoskeletal: Scattered subcutaneous edema at the flanks. Bones demineralized. IMPRESSION: RIGHT retroperitoneal hemorrhage in the pelvis as above, of uncertain etiology, maximal dimensions of 12.5 cm length, 2.2 cm thick, and 6.2 cm transverse. Question minimal bladder wall thickening versus artifact in artifact at the anterolateral RIGHT urinary bladder. Markedly increased stool in rectum and distal sigmoid colon. Bibasilar atelectasis and small LEFT pleural effusion. Findings discussed with Dr. Jena Gauss on 04/10/2018 at 1440 hours. Electronically Signed   By: Ulyses Southward M.D.   On: 04/10/2018 14:54   Dg Chest Port 1 View  Result Date: 04/10/2018 CLINICAL DATA:  Acute onset shortness of breath. EXAM: PORTABLE CHEST 1 VIEW COMPARISON:  09/13/2017. FINDINGS: Markedly suboptimal inspiration with linear atelectasis at both lung bases. Lungs otherwise clear. Pulmonary vascularity normal. No pleural effusions. Cardiac silhouette normal in size for AP portable technique. IMPRESSION: Markedly suboptimal inspiration which accounts for atelectasis at the lung bases. No acute cardiopulmonary disease otherwise. Electronically  Signed   By: Hulan Saas M.D.   On: 04/10/2018 11:43        Discharge Exam: Vitals:   04/13/18 0632 04/13/18 0633  BP: (!) 153/85   Pulse: (!) 117 85  Resp: 16   Temp: (!) 97.4 F (36.3 C)   SpO2: (!) 70% 100%   Vitals:   04/12/18 1429 04/12/18 2138 04/13/18 0632 04/13/18 0633  BP: (!) 186/90 (!) 138/92 (!) 153/85   Pulse:  89 (!) 117 85  Resp:  16 16   Temp:  98 F (36.7 C) (!) 97.4 F (36.3 C)   TempSrc:  Oral Oral   SpO2:  100% (!) 70% 100%  Weight:      Height:        General: Pt is alert, awake, not in acute distress Cardiovascular: RRR, S1/S2 +, no rubs, no gallops Respiratory: bibasilar crackles, no wheeze Abdominal: Soft, NT, ND, bowel sounds + Extremities: trace LE edema, no cyanosis   The results of significant diagnostics from this hospitalization (including imaging, microbiology, ancillary and laboratory) are listed below for reference.    Significant Diagnostic Studies: Ct Abdomen Pelvis W Contrast  Result Date: 04/10/2018 CLINICAL DATA:  RIGHT lower quadrant pain, hypotension, fall in hemoglobin, acute on chronic anemia, elevated lactic acid; history LEFT spastic hemiplegia, prior C difficile colitis EXAM: CT ABDOMEN AND PELVIS WITH CONTRAST TECHNIQUE: Multidetector CT imaging of the abdomen and pelvis was performed using the standard protocol following bolus administration of intravenous contrast. Sagittal and coronal MPR images reconstructed from axial data set. CONTRAST:  ISOVUE-300 IOPAMIDOL (ISOVUE-300) INJECTION 61% IV. Dilute oral contrast. COMPARISON:  09/13/2017 FINDINGS: Lower chest: Minimal LEFT pleural effusion and bibasilar atelectasis Hepatobiliary: Gallbladder and liver unremarkable Pancreas: Normal appearance Spleen: Normal appearance Adrenals/Urinary Tract: Adrenal glands normal appearance. Small BILATERAL renal cysts. Kidneys and ureters normal appearance. Question wall thickening of the RIGHT anterolateral bladder wall, see below.  Stomach/Bowel: Normal appendix. Markedly increased stool in rectum and distal sigmoid colon, rectum  up to 8.5 cm diameter. Minimal rectal wall thickening, decreased from previous exam. Scattered stool throughout remainder of colon. Stomach decompressed. Remaining bowel loops unremarkable. Vascular/Lymphatic: Atherosclerotic calcifications aorta, iliac arteries, common femoral arteries, coronary arteries. No adenopathy. Reproductive: Atrophic uterus and ovaries Other: Diffuse intermediate attenuation infiltrative changes are identified in the RIGHT retroperitoneum beginning inferior to the RIGHT kidney at the posterior pararenal fascia and extending into the anterior RIGHT pelvis, with some extension into the RIGHT lateral side wall and into the RIGHT anterior and lateral prevesical space compatible with retroperitoneal hemorrhage. Areas of hemorrhage measure up to 6.1 x 2.3 cm at the base of the RIGHT pericolic gutter and 6.2 x 2.0 cm at the RIGHT prevesical space. Hemorrhage overall approximately 12.5 cm length. Associated mild thickening of the anterolateral RIGHT bladder wall. Source of hemorrhage is uncertain; no aneurysm or mass identified, and no etiology arising from the inferior RIGHT kidney or RIGHT psoas muscle identified. No ascites or free air. Mild stranding and presacral space, nonspecific. No hernia. Musculoskeletal: Scattered subcutaneous edema at the flanks. Bones demineralized. IMPRESSION: RIGHT retroperitoneal hemorrhage in the pelvis as above, of uncertain etiology, maximal dimensions of 12.5 cm length, 2.2 cm thick, and 6.2 cm transverse. Question minimal bladder wall thickening versus artifact in artifact at the anterolateral RIGHT urinary bladder. Markedly increased stool in rectum and distal sigmoid colon. Bibasilar atelectasis and small LEFT pleural effusion. Findings discussed with Dr. Jena Gauss on 04/10/2018 at 1440 hours. Electronically Signed   By: Ulyses Southward M.D.   On: 04/10/2018 14:54    Dg Chest Port 1 View  Result Date: 04/10/2018 CLINICAL DATA:  Acute onset shortness of breath. EXAM: PORTABLE CHEST 1 VIEW COMPARISON:  09/13/2017. FINDINGS: Markedly suboptimal inspiration with linear atelectasis at both lung bases. Lungs otherwise clear. Pulmonary vascularity normal. No pleural effusions. Cardiac silhouette normal in size for AP portable technique. IMPRESSION: Markedly suboptimal inspiration which accounts for atelectasis at the lung bases. No acute cardiopulmonary disease otherwise. Electronically Signed   By: Hulan Saas M.D.   On: 04/10/2018 11:43     Microbiology: Recent Results (from the past 240 hour(s))  Culture, blood (Routine X 2) w Reflex to ID Panel     Status: None (Preliminary result)   Collection Time: 04/10/18  9:57 AM  Result Value Ref Range Status   Specimen Description BLOOD RIGHT ANTECUBITAL  Final   Special Requests   Final    BOTTLES DRAWN AEROBIC AND ANAEROBIC Blood Culture adequate volume   Culture   Final    NO GROWTH 2 DAYS Performed at [facility], [address], [place], [place] [address]    Report Status PENDING  Incomplete  Culture, blood (Routine X 2) w Reflex to ID Panel     Status: None (Preliminary result)   Collection Time: 04/10/18 10:05 AM  Result Value Ref Range Status   Specimen Description BLOOD BLOOD RIGHT FOREARM  Final   Special Requests   Final    BOTTLES DRAWN AEROBIC AND ANAEROBIC Blood Culture adequate volume   Culture   Final    NO GROWTH 2 DAYS Performed at [facility], [address] [place], [place] [address]    Report Status PENDING  Incomplete  Culture, Urine     Status: None   Collection Time: 04/10/18 12:19 PM  Result Value Ref Range Status   Specimen Description   Final    URINE, CATHETERIZED Performed at [facility], [address], [place], [place] [address]    Special Requests  Final    NONE Performed at [facility], [address], [place], [place] [address]    Culture   Final    NO  GROWTH Performed at [facility], [address] [place], [place] [address]    Report Status 04/12/2018 FINAL  Final     Labs: Basic Metabolic Panel: Recent Labs  Lab 04/10/18 0902 04/11/18 0535 04/12/18 0551 04/13/18 0636  NA 139 143 139 138  K 3.6 3.6 3.7 3.7  CL 101 110 107 104  CO2 25 26 24 27   GLUCOSE 105* 79 83 93  BUN 19 11 6* <5*  CREATININE 0.64 0.38* 0.39* 0.44  CALCIUM 8.5* 7.8* 7.6* 8.0*   Liver Function Tests: No results for input(s): AST, ALT, ALKPHOS, BILITOT, PROT, ALBUMIN in the last 168 hours. No results for input(s): LIPASE, AMYLASE in the last 168 hours. No results for input(s): AMMONIA in the last 168 hours. CBC: Recent Labs  Lab 04/10/18 0902 04/11/18 0535 04/12/18 0551 04/13/18 0636  WBC 9.1 6.0 5.7 6.2  HGB 9.6* 8.4* 9.1* 9.4*  HCT 32.7* 28.2* 30.4* 31.8*  MCV 101.6* 102.2* 102.4* 101.9*  PLT 245 193 226 206   Cardiac Enzymes: Recent Labs  Lab 04/12/18 0551  CKTOTAL 65   BNP: Invalid input(s): POCBNP CBG: No results for input(s): GLUCAP in the last 168 hours.  Time coordinating discharge:  36 minutes  Signed:  Catarina Hartshorn, DO [facility] Pager: [phone] 04/13/2018, 9:42 AM

## 2018-04-15 LAB — CULTURE, BLOOD (ROUTINE X 2)
Culture: NO GROWTH
Culture: NO GROWTH
SPECIAL REQUESTS: ADEQUATE
SPECIAL REQUESTS: ADEQUATE

## 2018-05-03 ENCOUNTER — Inpatient Hospital Stay (HOSPITAL_COMMUNITY)
Admission: EM | Admit: 2018-05-03 | Discharge: 2018-05-09 | DRG: 872 | Disposition: A | Discharge status: Skilled Nursing Facility | Payer: Medicaid Other | Source: Skilled Nursing Facility | Attending: Family Medicine | Admitting: Family Medicine

## 2018-05-03 ENCOUNTER — Emergency Department (HOSPITAL_COMMUNITY): Payer: Medicaid Other

## 2018-05-03 ENCOUNTER — Other Ambulatory Visit: Payer: Self-pay

## 2018-05-03 ENCOUNTER — Encounter (HOSPITAL_COMMUNITY): Payer: Self-pay | Admitting: Radiology

## 2018-05-03 DIAGNOSIS — G40909 Epilepsy, unspecified, not intractable, without status epilepticus: Secondary | ICD-10-CM | POA: Diagnosis present

## 2018-05-03 DIAGNOSIS — F05 Delirium due to known physiological condition: Secondary | ICD-10-CM | POA: Diagnosis present

## 2018-05-03 DIAGNOSIS — E785 Hyperlipidemia, unspecified: Secondary | ICD-10-CM | POA: Diagnosis present

## 2018-05-03 DIAGNOSIS — N3000 Acute cystitis without hematuria: Secondary | ICD-10-CM

## 2018-05-03 DIAGNOSIS — E870 Hyperosmolality and hypernatremia: Secondary | ICD-10-CM | POA: Diagnosis present

## 2018-05-03 DIAGNOSIS — F329 Major depressive disorder, single episode, unspecified: Secondary | ICD-10-CM | POA: Diagnosis present

## 2018-05-03 DIAGNOSIS — R1312 Dysphagia, oropharyngeal phase: Secondary | ICD-10-CM | POA: Diagnosis present

## 2018-05-03 DIAGNOSIS — I10 Essential (primary) hypertension: Secondary | ICD-10-CM | POA: Diagnosis present

## 2018-05-03 DIAGNOSIS — Z8673 Personal history of transient ischemic attack (TIA), and cerebral infarction without residual deficits: Secondary | ICD-10-CM | POA: Diagnosis not present

## 2018-05-03 DIAGNOSIS — I69320 Aphasia following cerebral infarction: Secondary | ICD-10-CM | POA: Diagnosis not present

## 2018-05-03 DIAGNOSIS — Z8619 Personal history of other infectious and parasitic diseases: Secondary | ICD-10-CM | POA: Diagnosis not present

## 2018-05-03 DIAGNOSIS — Z8679 Personal history of other diseases of the circulatory system: Secondary | ICD-10-CM | POA: Diagnosis not present

## 2018-05-03 DIAGNOSIS — F4320 Adjustment disorder, unspecified: Secondary | ICD-10-CM | POA: Diagnosis present

## 2018-05-03 DIAGNOSIS — Z79891 Long term (current) use of opiate analgesic: Secondary | ICD-10-CM

## 2018-05-03 DIAGNOSIS — Z800 Family history of malignant neoplasm of digestive organs: Secondary | ICD-10-CM

## 2018-05-03 DIAGNOSIS — E876 Hypokalemia: Secondary | ICD-10-CM | POA: Diagnosis present

## 2018-05-03 DIAGNOSIS — G9340 Encephalopathy, unspecified: Secondary | ICD-10-CM | POA: Diagnosis present

## 2018-05-03 DIAGNOSIS — M170 Bilateral primary osteoarthritis of knee: Secondary | ICD-10-CM | POA: Diagnosis present

## 2018-05-03 DIAGNOSIS — R0989 Other specified symptoms and signs involving the circulatory and respiratory systems: Secondary | ICD-10-CM

## 2018-05-03 DIAGNOSIS — Z8371 Family history of colonic polyps: Secondary | ICD-10-CM | POA: Diagnosis not present

## 2018-05-03 DIAGNOSIS — A419 Sepsis, unspecified organism: Secondary | ICD-10-CM | POA: Diagnosis present

## 2018-05-03 DIAGNOSIS — Z87440 Personal history of urinary (tract) infections: Secondary | ICD-10-CM | POA: Diagnosis not present

## 2018-05-03 DIAGNOSIS — Z8249 Family history of ischemic heart disease and other diseases of the circulatory system: Secondary | ICD-10-CM

## 2018-05-03 DIAGNOSIS — G8114 Spastic hemiplegia affecting left nondominant side: Secondary | ICD-10-CM | POA: Diagnosis present

## 2018-05-03 DIAGNOSIS — R9082 White matter disease, unspecified: Secondary | ICD-10-CM | POA: Diagnosis present

## 2018-05-03 DIAGNOSIS — Z7982 Long term (current) use of aspirin: Secondary | ICD-10-CM

## 2018-05-03 DIAGNOSIS — T17908A Unspecified foreign body in respiratory tract, part unspecified causing other injury, initial encounter: Secondary | ICD-10-CM

## 2018-05-03 DIAGNOSIS — F0150 Vascular dementia without behavioral disturbance: Secondary | ICD-10-CM | POA: Diagnosis present

## 2018-05-03 DIAGNOSIS — N390 Urinary tract infection, site not specified: Secondary | ICD-10-CM | POA: Diagnosis present

## 2018-05-03 DIAGNOSIS — Z79899 Other long term (current) drug therapy: Secondary | ICD-10-CM

## 2018-05-03 DIAGNOSIS — Z66 Do not resuscitate: Secondary | ICD-10-CM | POA: Diagnosis present

## 2018-05-03 DIAGNOSIS — R509 Fever, unspecified: Secondary | ICD-10-CM | POA: Diagnosis present

## 2018-05-03 LAB — URINALYSIS, ROUTINE W REFLEX MICROSCOPIC
Bilirubin Urine: NEGATIVE
Glucose, UA: NEGATIVE mg/dL
KETONES UR: 20 mg/dL — AB
Nitrite: POSITIVE — AB
Protein, ur: 30 mg/dL — AB
Specific Gravity, Urine: 1.018 (ref 1.005–1.030)
WBC, UA: >50 WBC/hpf — ABNORMAL HIGH (ref 0–5)
pH: 7 (ref 5.0–8.0)

## 2018-05-03 LAB — COMPREHENSIVE METABOLIC PANEL
ALK PHOS: 63 U/L (ref 38–126)
ALT: 16 U/L (ref 0–44)
AST: 29 U/L (ref 15–41)
Albumin: 2.6 g/dL — ABNORMAL LOW (ref 3.5–5.0)
Anion gap: 10 (ref 5–15)
BUN: 16 mg/dL (ref 8–23)
CALCIUM: 8.7 mg/dL — AB (ref 8.9–10.3)
CO2: 34 mmol/L — ABNORMAL HIGH (ref 22–32)
CREATININE: 0.69 mg/dL (ref 0.44–1.00)
Chloride: 101 mmol/L (ref 98–111)
Glucose, Bld: 83 mg/dL (ref 70–99)
Potassium: 3.9 mmol/L (ref 3.5–5.1)
Sodium: 145 mmol/L (ref 135–145)
Total Bilirubin: 0.9 mg/dL (ref 0.3–1.2)
Total Protein: 7.4 g/dL (ref 6.5–8.1)

## 2018-05-03 LAB — CBC WITH DIFFERENTIAL/PLATELET
Abs Immature Granulocytes: 0.03 10*3/uL (ref 0.00–0.07)
Basophils Absolute: 0 10*3/uL (ref 0.0–0.1)
Basophils Relative: 1 %
EOS ABS: 0 10*3/uL (ref 0.0–0.5)
EOS PCT: 0 %
HEMATOCRIT: 38 % (ref 36.0–46.0)
Hemoglobin: 11.1 g/dL — ABNORMAL LOW (ref 12.0–15.0)
Immature Granulocytes: 0 %
LYMPHS ABS: 1.3 10*3/uL (ref 0.7–4.0)
Lymphocytes Relative: 18 %
MCH: 30 pg (ref 26.0–34.0)
MCHC: 29.2 g/dL — AB (ref 30.0–36.0)
MCV: 102.7 fL — AB (ref 80.0–100.0)
Monocytes Absolute: 0.8 10*3/uL (ref 0.1–1.0)
Monocytes Relative: 11 %
NRBC: 0 % (ref 0.0–0.2)
Neutro Abs: 5 10*3/uL (ref 1.7–7.7)
Neutrophils Relative %: 70 %
Platelets: 133 10*3/uL — ABNORMAL LOW (ref 150–400)
RBC: 3.7 MIL/uL — ABNORMAL LOW (ref 3.87–5.11)
RDW: 17.4 % — ABNORMAL HIGH (ref 11.5–15.5)
WBC: 7.3 10*3/uL (ref 4.0–10.5)

## 2018-05-03 LAB — BLOOD GAS, ARTERIAL
ACID-BASE EXCESS: 7.2 mmol/L — AB (ref 0.0–2.0)
Bicarbonate: 30.5 mmol/L — ABNORMAL HIGH (ref 20.0–28.0)
DRAWN BY: 10555
O2 CONTENT: 4 L/min
O2 SAT: 96.7 %
PH ART: 7.408 (ref 7.350–7.450)
pCO2 arterial: 51.7 mmHg — ABNORMAL HIGH (ref 32.0–48.0)
pO2, Arterial: 95.4 mmHg (ref 83.0–108.0)

## 2018-05-03 LAB — CBG MONITORING, ED
GLUCOSE-CAPILLARY: 67 mg/dL — AB (ref 70–99)
Glucose-Capillary: 63 mg/dL — ABNORMAL LOW (ref 70–99)

## 2018-05-03 LAB — I-STAT CG4 LACTIC ACID, ED: Lactic Acid, Venous: 2.84 mmol/L (ref 0.5–1.9)

## 2018-05-03 LAB — VALPROIC ACID LEVEL: Valproic Acid Lvl: 88 ug/mL (ref 50.0–100.0)

## 2018-05-03 MED ORDER — ACETAMINOPHEN 325 MG RE SUPP
325.0000 mg | Freq: Once | RECTAL | Status: AC
  Administered 2018-05-03: 325 mg via RECTAL
  Filled 2018-05-03: qty 1

## 2018-05-03 MED ORDER — ACETAMINOPHEN 325 MG PO TABS: 650.0000 mg | ORAL_TABLET | Freq: Four times a day (QID) | ORAL | Status: DC | PRN

## 2018-05-03 MED ORDER — ONDANSETRON HCL 4 MG/2ML IJ SOLN: 4.0000 mg | Freq: Four times a day (QID) | INTRAMUSCULAR | Status: DC | PRN

## 2018-05-03 MED ORDER — SODIUM CHLORIDE 0.9 % IV SOLN
1.0000 g | Freq: Three times a day (TID) | INTRAVENOUS | Status: DC
  Administered 2018-05-04 – 2018-05-06 (×8): 1 g via INTRAVENOUS
  Filled 2018-05-03 (×12): qty 1

## 2018-05-03 MED ORDER — SODIUM CHLORIDE 0.9 % IV BOLUS (SEPSIS): 250.0000 mL | Freq: Once | INTRAVENOUS | Status: DC

## 2018-05-03 MED ORDER — DEXTROSE-NACL 5-0.45 % IV SOLN
INTRAVENOUS | Status: DC
  Administered 2018-05-03: 23:00:00 via INTRAVENOUS

## 2018-05-03 MED ORDER — SODIUM CHLORIDE 0.9 % IV SOLN
1.0000 g | INTRAVENOUS | Status: DC
  Administered 2018-05-03: 1 g via INTRAVENOUS
  Filled 2018-05-03: qty 10

## 2018-05-03 MED ORDER — SODIUM CHLORIDE 0.9% FLUSH
3.0000 mL | Freq: Two times a day (BID) | INTRAVENOUS | Status: DC
  Administered 2018-05-03 – 2018-05-08 (×4): 3 mL via INTRAVENOUS

## 2018-05-03 MED ORDER — SODIUM CHLORIDE 0.9 % IV BOLUS (SEPSIS)
1000.0000 mL | Freq: Once | INTRAVENOUS | Status: AC
  Administered 2018-05-03: 1000 mL via INTRAVENOUS

## 2018-05-03 MED ORDER — SODIUM CHLORIDE 0.9 % IV BOLUS (SEPSIS)
500.0000 mL | Freq: Once | INTRAVENOUS | Status: AC
  Administered 2018-05-03: 750 mL via INTRAVENOUS

## 2018-05-03 MED ORDER — POLYETHYLENE GLYCOL 3350 17 G PO PACK: 17.0000 g | PACK | Freq: Every day | ORAL | Status: DC | PRN

## 2018-05-03 MED ORDER — ENOXAPARIN SODIUM 40 MG/0.4ML ~~LOC~~ SOLN
40.0000 mg | SUBCUTANEOUS | Status: DC
  Administered 2018-05-04 – 2018-05-08 (×5): 40 mg via SUBCUTANEOUS
  Filled 2018-05-03 (×5): qty 0.4

## 2018-05-03 MED ORDER — ACETAMINOPHEN 650 MG RE SUPP: 650.0000 mg | Freq: Four times a day (QID) | RECTAL | Status: DC | PRN

## 2018-05-03 MED ORDER — ACETAMINOPHEN 650 MG RE SUPP
650.0000 mg | Freq: Once | RECTAL | Status: AC
  Administered 2018-05-03: 650 mg via RECTAL
  Filled 2018-05-03: qty 1

## 2018-05-03 MED ORDER — ONDANSETRON HCL 4 MG PO TABS: 4.0000 mg | ORAL_TABLET | Freq: Four times a day (QID) | ORAL | Status: DC | PRN

## 2018-05-03 MED ORDER — HYDROCODONE-ACETAMINOPHEN 5-325 MG PO TABS
1.0000 | ORAL_TABLET | ORAL | Status: DC | PRN
  Administered 2018-05-04 – 2018-05-09 (×2): 1 via ORAL
  Filled 2018-05-03 (×2): qty 1

## 2018-05-03 NOTE — ED Notes (Signed)
Blood glucose noted to be 63 2040  Repeat glucose

## 2018-05-03 NOTE — ED Notes (Signed)
hospitalist at bedside

## 2018-05-03 NOTE — ED Provider Notes (Signed)
[facility] EMERGENCY DEPARTMENT Provider Note   CSN: [id number] Arrival date & time: 05/03/18  1932     History   Chief Complaint Chief Complaint  Patient presents with  . Fever    HPI Lisa [name] is a 62 y.o. female.  Pt presents to the ED today with a fever.  She is from the [facility] and a nurse noted that she had a fever.  No antipyretics were given.  She also had a "low bp," but we don't know this number.  Pt has a hx of SAH and aphasia and is unable to give any hx.  Pt was admitted last month for a GI bleed/retroperitoneal hemorrhage.     Past Medical History:  Diagnosis Date  . Abnormal posture   . Adjustment disorder   . Anemia, unspecified   . Aphasia-angular gyrus syndrome   . Arthritis, climacteric, knee, left   . Atypical psychosis (HCC)   . C. difficile diarrhea   . Cerebral arteritis   . Contracture of left ankle   . Essential hypertension, malignant   . Essential hypertension, malignant   . Hyperlipemia   . Left spastic hemiplegia (HCC)   . Muscle weakness (generalized)   . 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 Oropharyngeal dysphagia   . Other sequelae following nontraumatic subarachnoid hemorrhage   . Primary osteoarthritis of both knees   . Screening for thyroid disorder   . Screening for thyroid disorder   . Seizures (HCC)   . Sequelae of cerebral infarction   . Unsteady   . Vascular dementia with delirium [facility])     Patient Active Problem List   Diagnosis Date Noted  . Retroperitoneal bleeding 04/11/2018  . Retroperitoneal hematoma   . Goals of care, counseling/discussion   . Palliative care by specialist   . DNR (do not resuscitate) discussion   . Acute blood loss anemia 04/10/2018  . Hypotension 04/10/2018  . Seizure disorder (HCC) 04/10/2018  . RLQ abdominal pain   . Heme positive stool 02/28/2018  . E coli bacteremia 09/26/2017  . Sepsis due to Escherichia coli (E. coli) (HCC)  09/26/2017  . C. difficile colitis 09/25/2017  . Sepsis due to undetermined organism (HCC) 09/24/2017  . Left spastic hemiplegia (HCC) 09/14/2017  . Acute pyelonephritis 09/13/2017  . Hypokalemia 09/13/2017  . Anemia, unspecified 09/13/2017  . Hyperlipemia 09/13/2017  . 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 Hypertension 09/13/2017    Past Surgical History:  Procedure Laterality Date  . CEREBRAL ANEURYSM REPAIR     2017? then suffered stroke     OB History   None      Home Medications    Prior to Admission medications   Medication Sig Start Date End Date Taking? Authorizing Provider  acetaminophen (TYLENOL) 325 MG tablet Take by mouth every 6 (six) hours as needed for mild pain.    [provider]  amLODipine (NORVASC) 10 MG tablet Take 1 tablet (10 mg total) by mouth daily. 04/13/18   Catarina Hartshorn, MD  aspirin EC 81 MG tablet Take 1 tablet (81 mg total) by mouth daily. 09/28/17   Catarina Hartshorn, MD  calcium carbonate (TUMS - DOSED IN MG ELEMENTAL CALCIUM) 500 MG chewable tablet Chew 1 tablet by mouth daily.    [provider]  divalproex (DEPAKOTE) 250 MG DR tablet Take 750 mg by mouth 2 (two) times daily.     [provider]  famotidine (PEPCID)  20 MG tablet Take 20 mg by mouth daily.    [provider]  HYDROcodone-acetaminophen (NORCO) 5-325 MG tablet Take 1 tablet by mouth every 6 (six) hours as needed for moderate pain. 04/13/18   Catarina Hartshorn, MD  levETIRAcetam (KEPPRA) 500 MG tablet Take 500 mg by mouth 2 (two) times daily.    [provider]  mirtazapine (REMERON) 15 MG tablet Take 15 mg by mouth at bedtime.    [provider]  polyethylene glycol (MIRALAX / GLYCOLAX) packet Take 17 g by mouth 2 (two) times daily. 04/13/18   Catarina Hartshorn, MD  sertraline (ZOLOFT) 50 MG tablet Take 50 mg by mouth daily.    [provider]  tiZANidine (ZANAFLEX) 2 MG tablet Take by mouth 2  (two) times daily.    [provider]  Vitamin D, Ergocalciferol, (DRISDOL) 50000 units CAPS capsule Take 50,000 Units by mouth every 7 (seven) days.    [provider]    Family History Family History  Problem Relation Age of Onset  . Hypertension Other   . Colon cancer Father        diagnosed in his 23s   . Colon polyps Sister     Social History Social History   Tobacco Use  . Smoking status: Never Smoker  . Smokeless tobacco: Never Used  Substance Use Topics  . Alcohol use: No    Frequency: Never  . Drug use: No     Allergies   Patient has no known allergies.   Review of Systems Review of Systems  Unable to perform ROS: Patient nonverbal  Constitutional: Positive for fever.     Physical Exam Updated Vital Signs BP 118/73   Pulse (!) 106   Temp (!) 101.3 F (38.5 C) (Oral)   Resp 18   SpO2 93%   Physical Exam  Constitutional: She appears well-developed and well-nourished. She appears lethargic.  HENT:  Head: Normocephalic and atraumatic.  Right Ear: External ear normal.  Left Ear: External ear normal.  Nose: Nose normal.  Mouth/Throat: Mucous membranes are dry.  Eyes: Pupils are equal, round, and reactive to light. Conjunctivae and EOM are normal.  Neck: Normal range of motion. Neck supple.  Cardiovascular: Regular rhythm, normal heart sounds and intact distal pulses. Tachycardia present.  Pulmonary/Chest: Effort normal and breath sounds normal.  Abdominal: Soft. Bowel sounds are normal.  Musculoskeletal: Normal range of motion.  Neurological: She appears lethargic.  Left sided weakness and contractures Mumbling words She is not following commands  Skin: Skin is warm. Capillary refill takes less than 2 seconds.  Psychiatric:  Unable to assess  Nursing note and vitals reviewed.    ED Treatments / Results  Labs (all labs ordered are listed, but only abnormal results are displayed) Labs Reviewed  COMPREHENSIVE METABOLIC PANEL  - Abnormal; Notable for the following components:      Result Value   CO2 34 (*)    Calcium 8.7 (*)    Albumin 2.6 (*)    All other components within normal limits  CBC WITH DIFFERENTIAL/PLATELET - Abnormal; Notable for the following components:   RBC 3.70 (*)    Hemoglobin 11.1 (*)    MCV 102.7 (*)    MCHC 29.2 (*)    RDW 17.4 (*)    Platelets 133 (*)    All other components within normal limits  URINALYSIS, ROUTINE W REFLEX MICROSCOPIC - Abnormal; Notable for the following components:   Color, Urine AMBER (*)    APPearance  CLOUDY (*)    Hgb urine dipstick MODERATE (*)    Ketones, ur 20 (*)    Protein, ur 30 (*)    Nitrite POSITIVE (*)    Leukocytes, UA LARGE (*)    WBC, UA >50 (*)    Bacteria, UA FEW (*)    Non Squamous Epithelial 0-5 (*)    All other components within normal limits  BLOOD GAS, ARTERIAL - Abnormal; Notable for the following components:   pCO2 arterial 51.7 (*)    Bicarbonate 30.5 (*)    Acid-Base Excess 7.2 (*)    All other components within normal limits  CBG MONITORING, ED - Abnormal; Notable for the following components:   Glucose-Capillary 63 (*)    All other components within normal limits  I-STAT CG4 LACTIC ACID, ED - Abnormal; Notable for the following components:   Lactic Acid, Venous 2.84 (*)    All other components within normal limits  CBG MONITORING, ED - Abnormal; Notable for the following components:   Glucose-Capillary 67 (*)    All other components within normal limits  CULTURE, BLOOD (ROUTINE X 2)  CULTURE, BLOOD (ROUTINE X 2)  URINE CULTURE  VALPROIC ACID LEVEL  LACTIC ACID, PLASMA  LACTIC ACID, PLASMA    EKG EKG Interpretation  Date/Time:  Friday May 03 2018 19:44:15 EDT Ventricular Rate:  137 PR Interval:    QRS Duration: 69 QT Interval:  252 QTC Calculation: 381 R Axis:   15 Text Interpretation:  Sinus tachycardia Repol abnrm suggests ischemia, lateral leads No old tracing to compare Confirmed by Jacalyn Lefevre  ([id number]) on 05/03/2018 7:48:55 PM   Radiology Dg Chest Port 1 View  Result Date: 05/03/2018 CLINICAL DATA:  62 y/o  F; fever. Code sepsis. EXAM: PORTABLE CHEST 1 VIEW COMPARISON:  04/10/2018 chest radiograph FINDINGS: Stable cardiac silhouette given projection and technique. Lung apices partially obscured by patient's chin. No consolidation, effusion, or pneumothorax identified. Bones are unremarkable. IMPRESSION: No acute pulmonary process identified. Electronically Signed   By: Mitzi Hansen M.D.   On: 05/03/2018 20:27    Procedures Procedures (including critical care time)  Medications Ordered in ED Medications  sodium chloride 0.9 % bolus 1,000 mL (0 mLs Intravenous Stopped 05/03/18 2125)    And  sodium chloride 0.9 % bolus 500 mL (750 mLs Intravenous New Bag/Given 05/03/18 2119)    And  sodium chloride 0.9 % bolus 250 mL (250 mLs Intravenous Not Given 05/03/18 2124)  cefTRIAXone (ROCEPHIN) 1 g in sodium chloride 0.9 % 100 mL IVPB (0 g Intravenous Stopped 05/03/18 2125)  acetaminophen (TYLENOL) suppository 650 mg (650 mg Rectal Given 05/03/18 2038)  acetaminophen (TYLENOL) suppository 325 mg (325 mg Rectal Given 05/03/18 2038)     Initial Impression / Assessment and Plan / ED Course  I have reviewed the triage vital signs and the nursing notes.  Pertinent labs & imaging results that were available during my care of the patient were reviewed by me and considered in my medical decision making (see chart for details).    Pt given tylenol for fever.  Code sepsis was called and she was given 30 cc/kg IVFs.  She does have a UTI, so was given rocephin.  HR is coming down.  BP is stable.  CRITICAL CARE Performed by: Jacalyn Lefevre   Total critical care time: 30 minutes  Critical care time was exclusive of separately billable procedures and treating other patients.  Critical care was necessary to treat or prevent imminent or life-threatening deterioration.  Critical care was  time spent personally by me on the following activities: development of treatment plan with patient and/or surrogate as well as nursing, discussions with consultants, evaluation of patient's response to treatment, examination of patient, obtaining history from patient or surrogate, ordering and performing treatments and interventions, ordering and review of laboratory studies, ordering and review of radiographic studies, pulse oximetry and re-evaluation of patient's condition.  Pt d/w Dr. Antionette Char ([facility]) for admission.  Final Clinical Impressions(s) / ED Diagnoses   Final diagnoses:  Sepsis without acute organ dysfunction, due to unspecified organism [facility])  Acute cystitis without hematuria    ED Discharge Orders    None       Jacalyn Lefevre, MD 05/03/18 2207

## 2018-05-03 NOTE — H&P (Signed)
History and Physical    Lisa [name] MVH:[id number] DOB: 1955/09/12 DOA: 05/03/2018  PCP: Audree Bane, DO   Patient coming from: [facility]   Chief Complaint: Low BP, fever  HPI: Lisa [name] is a 62 y.o. female with medical history significant for adjustment disorder, hypertension, history of CVA, left-sided spastic hemiplegia, aphasia, ESBL E. coli bacteremia earlier this year, and recent admission with GI bleeding, now presenting to the emergency department for evaluation of low blood pressure.  Patient is unable to contribute to the history due to her clinical condition.  She was reportedly noted to have a low blood pressure and fever at her nursing facility and directed to the ED for further evaluation of this.  ED Course: Upon arrival to the ED, patient is found to be febrile to 38.5 C, tachycardic to 140, and with stable blood pressure.  EKG features sinus tachycardia with rate 137 and repolarization abnormality.  Chest x-ray is negative for acute cardiopulmonary disease.  Chemistry panel is unremarkable.  CBC is notable for a microcytic anemia with hemoglobin of 11.1, up from 9.4 last month.  Lactic acid is elevated to 2.84.  Urinalysis is suggestive of infection.  Blood and urine cultures were collected, 30 cc/kg normal saline bolus was given, and the patient was treated with empiric Rocephin.  Tachycardia has improved, blood pressure remained stable, and the patient will be admitted for ongoing evaluation and management of sepsis secondary to UTI.  Review of Systems:  Unable to complete ROS secondary to the patient's clinical condition.  Past Medical History:  Diagnosis Date  . Abnormal posture   . Adjustment disorder   . Anemia, unspecified   . Aphasia-angular gyrus syndrome   . Arthritis, climacteric, knee, left   . Atypical psychosis (HCC)   . C. difficile diarrhea   . Cerebral arteritis   . Contracture of left ankle   . Essential hypertension, malignant   .  Essential hypertension, malignant   . Hyperlipemia   . Left spastic hemiplegia (HCC)   . Muscle weakness (generalized)   . 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 Oropharyngeal dysphagia   . Other sequelae following nontraumatic subarachnoid hemorrhage   . Primary osteoarthritis of both knees   . Screening for thyroid disorder   . Screening for thyroid disorder   . Seizures (HCC)   . Sequelae of cerebral infarction   . Unsteady   . Vascular dementia with delirium [facility])     Past Surgical History:  Procedure Laterality Date  . CEREBRAL ANEURYSM REPAIR     2017? then suffered stroke     reports that she has never smoked. She has never used smokeless tobacco. She reports that she does not drink alcohol or use drugs.  No Known Allergies  Family History  Problem Relation Age of Onset  . Hypertension Other   . Colon cancer Father        diagnosed in his 44s   . Colon polyps Sister      Prior to Admission medications   Medication Sig Start Date End Date Taking? Authorizing Provider  acetaminophen (TYLENOL) 325 MG tablet Take by mouth every 6 (six) hours as needed for mild pain.    [provider]  amLODipine (NORVASC) 10 MG tablet Take 1 tablet (10 mg total) by mouth daily. 04/13/18   Catarina Hartshorn, MD  aspirin EC 81 MG tablet Take 1 tablet (81 mg total) by mouth daily. 09/28/17   Catarina Hartshorn, MD  calcium carbonate (TUMS - DOSED IN MG ELEMENTAL CALCIUM) 500 MG chewable tablet Chew 1 tablet by mouth daily.    [provider]  divalproex (DEPAKOTE) 250 MG DR tablet Take 750 mg by mouth 2 (two) times daily.     [provider]  famotidine (PEPCID) 20 MG tablet Take 20 mg by mouth daily.    [provider]  HYDROcodone-acetaminophen (NORCO) 5-325 MG tablet Take 1 tablet by mouth every 6 (six) hours as needed for moderate pain. 04/13/18   Catarina Hartshorn, MD  levETIRAcetam (KEPPRA) 500 MG tablet Take 500 mg by mouth 2 (two)  times daily.    [provider]  mirtazapine (REMERON) 15 MG tablet Take 15 mg by mouth at bedtime.    [provider]  polyethylene glycol (MIRALAX / GLYCOLAX) packet Take 17 g by mouth 2 (two) times daily. 04/13/18   Catarina Hartshorn, MD  sertraline (ZOLOFT) 50 MG tablet Take 50 mg by mouth daily.    [provider]  tiZANidine (ZANAFLEX) 2 MG tablet Take by mouth 2 (two) times daily.    [provider]  Vitamin D, Ergocalciferol, (DRISDOL) 50000 units CAPS capsule Take 50,000 Units by mouth every 7 (seven) days.    [provider]    Physical Exam: Vitals:   05/03/18 2015 05/03/18 2100 05/03/18 2131 05/03/18 2200  BP:   129/81 118/73  Pulse: 73 (!) 127 (!) 118 (!) 106  Resp: (!) 21 17 16 18   Temp:      TempSrc:      SpO2: 94% 100% 100% 93%    Constitutional: NAD, calm  Eyes: PERTLA, lids and conjunctivae normal ENMT: Mucous membranes are moist. Posterior pharynx clear of any exudate or lesions.   Neck: normal, supple, no masses, no thyromegaly Respiratory: clear to auscultation bilaterally, no wheezing, no crackles. Normal respiratory effort.   Cardiovascular: Rate ~110 and regular. No extremity edema.  Abdomen: No distension, no tenderness, soft. Bowel sounds normal.  Musculoskeletal: no clubbing / cyanosis. Flexion contractures on left.    Skin: no significant rashes, lesions, ulcers. Warm, dry, well-perfused. Neurologic: Expressive aphasia, no gross facial asymmetry. Moving right side spontaneously. Left hemiplegia.   Psychiatric: Alert, no meaningful speech. Calm.    Labs on Admission: I have personally reviewed following labs and imaging studies  CBC: Recent Labs  Lab 05/03/18 1954  WBC 7.3  NEUTROABS 5.0  HGB 11.1*  HCT 38.0  MCV 102.7*  PLT 133*   Basic Metabolic Panel: Recent Labs  Lab 05/03/18 1954  NA 145  K 3.9  CL 101  CO2 34*  GLUCOSE 83  BUN 16  CREATININE 0.69  CALCIUM 8.7*   GFR: CrCl cannot be  calculated (Unknown ideal weight.). Liver Function Tests: Recent Labs  Lab 05/03/18 1954  AST 29  ALT 16  ALKPHOS 63  BILITOT 0.9  PROT 7.4  ALBUMIN 2.6*   No results for input(s): LIPASE, AMYLASE in the last 168 hours. No results for input(s): AMMONIA in the last 168 hours. Coagulation Profile: No results for input(s): INR, PROTIME in the last 168 hours. Cardiac Enzymes: No results for input(s): CKTOTAL, CKMB, CKMBINDEX, TROPONINI in the last 168 hours. BNP (last 3 results) No results for input(s): PROBNP in the last 8760 hours. HbA1C: No results for input(s): HGBA1C in the last 72 hours. CBG: Recent Labs  Lab 05/03/18 1941 05/03/18 2140  GLUCAP 63* 67*   Lipid Profile: No results for input(s): CHOL, HDL, LDLCALC, TRIG, CHOLHDL, LDLDIRECT in  the last 72 hours. Thyroid Function Tests: No results for input(s): TSH, T4TOTAL, FREET4, T3FREE, THYROIDAB in the last 72 hours. Anemia Panel: No results for input(s): VITAMINB12, FOLATE, FERRITIN, TIBC, IRON, RETICCTPCT in the last 72 hours. Urine analysis:    Component Value Date/Time   COLORURINE AMBER (A) 05/03/2018 2051   APPEARANCEUR CLOUDY (A) 05/03/2018 2051   LABSPEC 1.018 05/03/2018 2051   PHURINE 7.0 05/03/2018 2051   GLUCOSEU NEGATIVE 05/03/2018 2051   HGBUR MODERATE (A) 05/03/2018 2051   BILIRUBINUR NEGATIVE 05/03/2018 2051   KETONESUR 20 (A) 05/03/2018 2051   PROTEINUR 30 (A) 05/03/2018 2051   NITRITE POSITIVE (A) 05/03/2018 2051   LEUKOCYTESUR LARGE (A) 05/03/2018 2051   Sepsis Labs: @LABRCNTIP (procalcitonin:4,lacticidven:4) )No results found for this or any previous visit (from the past 240 hour(s)).   Radiological Exams on Admission: Dg Chest Port 1 View  Result Date: 05/03/2018 CLINICAL DATA:  62 y/o  F; fever. Code sepsis. EXAM: PORTABLE CHEST 1 VIEW COMPARISON:  04/10/2018 chest radiograph FINDINGS: Stable cardiac silhouette given projection and technique. Lung apices partially obscured by patient's  chin. No consolidation, effusion, or pneumothorax identified. Bones are unremarkable. IMPRESSION: No acute pulmonary process identified. Electronically Signed   By: Mitzi Hansen M.D.   On: 05/03/2018 20:27    EKG: Independently reviewed. Sinus tachycardia (rate 137), repolarization abnormality.   Assessment/Plan  1. Sepsis secondary to UTI  - Presents from SNF with reports of hypotension and is found to be febrile with tachycardia, elevated lactate, and UA suggestive of infection  - Blood and urine cultures were collected, 30 cc/kg NS bolus given, and she was treated with empiric Rocephin  - She had UTI with ESBL E coli bacteremia earlier this year, so favor using meropenem for empiric coverage pending clinical course and cultures    2. History of CVA  - History of hemorrhagic CVA  - Continue ASA   3. Hypertension  - BP was reportedly low at SNF, will hold antihypertensives initially   4. Seizure disorder  - Continue valproic acid and Keppra    5. Depression  - Continue Zoloft and Remeron    DVT prophylaxis: Lovenox  Code Status: DNR  Family Communication: Discussed with patient  Consults called: None Admission status: Inpatient     Briscoe Deutscher, MD [facility] Pager [phone]  If 7PM-7AM, please contact night-coverage [email or web address] Password [email or web address]  05/03/2018, 10:14 PM

## 2018-05-03 NOTE — ED Notes (Signed)
Report to Heather, RN.

## 2018-05-03 NOTE — ED Triage Notes (Signed)
Patient nurse at [facility] noted patient to have fever and "low BP".    Patient has not had any tylenol given by staff or EMS.

## 2018-05-04 ENCOUNTER — Other Ambulatory Visit: Payer: Self-pay

## 2018-05-04 LAB — BASIC METABOLIC PANEL
Anion gap: 5 (ref 5–15)
BUN: 13 mg/dL (ref 8–23)
CHLORIDE: 109 mmol/L (ref 98–111)
CO2: 32 mmol/L (ref 22–32)
Calcium: 7.5 mg/dL — ABNORMAL LOW (ref 8.9–10.3)
Creatinine, Ser: 0.59 mg/dL (ref 0.44–1.00)
GFR calc Af Amer: >60 mL/min (ref >60)
GLUCOSE: 103 mg/dL — AB (ref 70–99)
POTASSIUM: 3.1 mmol/L — AB (ref 3.5–5.1)
Sodium: 146 mmol/L — ABNORMAL HIGH (ref 135–145)

## 2018-05-04 LAB — CBC
HEMATOCRIT: 28 % — AB (ref 36.0–46.0)
HEMOGLOBIN: 8.1 g/dL — AB (ref 12.0–15.0)
MCH: 30 pg (ref 26.0–34.0)
MCHC: 28.9 g/dL — AB (ref 30.0–36.0)
MCV: 103.7 fL — AB (ref 80.0–100.0)
Platelets: 81 10*3/uL — ABNORMAL LOW (ref 150–400)
RBC: 2.7 MIL/uL — ABNORMAL LOW (ref 3.87–5.11)
RDW: 17.3 % — ABNORMAL HIGH (ref 11.5–15.5)
WBC: 7.2 10*3/uL (ref 4.0–10.5)
nRBC: 0 % (ref 0.0–0.2)

## 2018-05-04 LAB — GLUCOSE, CAPILLARY
GLUCOSE-CAPILLARY: 127 mg/dL — AB (ref 70–99)
Glucose-Capillary: 105 mg/dL — ABNORMAL HIGH (ref 70–99)
Glucose-Capillary: 139 mg/dL — ABNORMAL HIGH (ref 70–99)
Glucose-Capillary: 190 mg/dL — ABNORMAL HIGH (ref 70–99)
Glucose-Capillary: 84 mg/dL (ref 70–99)
Glucose-Capillary: 91 mg/dL (ref 70–99)

## 2018-05-04 LAB — LACTIC ACID, PLASMA
LACTIC ACID, VENOUS: 1 mmol/L (ref 0.5–1.9)
Lactic Acid, Venous: 1.5 mmol/L (ref 0.5–1.9)

## 2018-05-04 LAB — MAGNESIUM: MAGNESIUM: 2 mg/dL (ref 1.7–2.4)

## 2018-05-04 LAB — MRSA PCR SCREENING: MRSA BY PCR: NEGATIVE

## 2018-05-04 MED ORDER — TIZANIDINE HCL 2 MG PO TABS
2.0000 mg | ORAL_TABLET | Freq: Two times a day (BID) | ORAL | Status: DC
  Administered 2018-05-04 – 2018-05-06 (×6): 2 mg via ORAL
  Filled 2018-05-04 (×6): qty 1

## 2018-05-04 MED ORDER — ASPIRIN EC 81 MG PO TBEC
81.0000 mg | DELAYED_RELEASE_TABLET | Freq: Every day | ORAL | Status: DC
  Administered 2018-05-04 – 2018-05-05 (×2): 81 mg via ORAL
  Filled 2018-05-04 (×3): qty 1

## 2018-05-04 MED ORDER — KCL IN DEXTROSE-NACL 20-5-0.9 MEQ/L-%-% IV SOLN
INTRAVENOUS | Status: DC
  Administered 2018-05-04 (×3): via INTRAVENOUS

## 2018-05-04 MED ORDER — LEVETIRACETAM 500 MG PO TABS
500.0000 mg | ORAL_TABLET | Freq: Two times a day (BID) | ORAL | Status: DC
  Administered 2018-05-04 – 2018-05-06 (×6): 500 mg via ORAL
  Filled 2018-05-04 (×6): qty 1

## 2018-05-04 MED ORDER — FAMOTIDINE 20 MG PO TABS
20.0000 mg | ORAL_TABLET | Freq: Every day | ORAL | Status: DC
  Administered 2018-05-04 – 2018-05-09 (×6): 20 mg via ORAL
  Filled 2018-05-04 (×6): qty 1

## 2018-05-04 MED ORDER — SERTRALINE HCL 50 MG PO TABS
50.0000 mg | ORAL_TABLET | Freq: Every day | ORAL | Status: DC
  Administered 2018-05-04 – 2018-05-09 (×6): 50 mg via ORAL
  Filled 2018-05-04 (×6): qty 1

## 2018-05-04 MED ORDER — MIRTAZAPINE 15 MG PO TABS
15.0000 mg | ORAL_TABLET | Freq: Every day | ORAL | Status: DC
  Administered 2018-05-04 – 2018-05-08 (×5): 15 mg via ORAL
  Filled 2018-05-04 (×5): qty 1

## 2018-05-04 MED ORDER — POLYETHYLENE GLYCOL 3350 17 G PO PACK
17.0000 g | PACK | Freq: Two times a day (BID) | ORAL | Status: DC
  Administered 2018-05-04 – 2018-05-09 (×10): 17 g via ORAL
  Filled 2018-05-04 (×10): qty 1

## 2018-05-04 MED ORDER — ORAL CARE MOUTH RINSE
15.0000 mL | Freq: Two times a day (BID) | OROMUCOSAL | Status: DC
  Administered 2018-05-05 – 2018-05-07 (×5): 15 mL via OROMUCOSAL

## 2018-05-04 MED ORDER — DEXTROSE-NACL 5-0.45 % IV SOLN: INTRAVENOUS | Status: DC

## 2018-05-04 MED ORDER — SALINE SPRAY 0.65 % NA SOLN
1.0000 | NASAL | Status: DC | PRN
  Administered 2018-05-05: 1 via NASAL
  Filled 2018-05-04: qty 44

## 2018-05-04 MED ORDER — DIVALPROEX SODIUM 250 MG PO DR TAB
750.0000 mg | DELAYED_RELEASE_TABLET | Freq: Two times a day (BID) | ORAL | Status: DC
Start: 2018-05-04 — End: 2018-05-06
  Administered 2018-05-04 – 2018-05-05 (×5): 750 mg via ORAL
  Filled 2018-05-04 (×6): qty 3

## 2018-05-04 NOTE — Progress Notes (Signed)
PROGRESS NOTE    Lisa [name]  ZOX:[id number]  DOB: Nov 07, 1955  DOA: 05/03/2018 PCP: Audree Bane, DO   Brief Admission Hx: Lisa [name] is a 62 y.o. female with medical history significant for adjustment disorder, hypertension, history of CVA, left-sided spastic hemiplegia, aphasia, ESBL E. coli bacteremia earlier this year, and recent admission with GI bleeding, now presenting to the emergency department for evaluation of low blood pressure.  Patient is unable to contribute to the history due to her clinical condition.  She was reportedly noted to have a low blood pressure and fever at her nursing facility and directed to the ED for further evaluation of this.  MDM/Assessment & Plan: 1. Sepsis secondary to UTI - continue current therapy with meropenem.  Follow culture and sensitivities.  2. History of CVA - continue aspirin.  3. Essential hypertension - with hypotension on admission, holding antihypertensives for now.  4. Epilepsy - continue home meds.  5. Depression - continue zoloft and remeron.    DVT prophylaxis: lovenox  Code Status: DNR  Family Communication: patient updated at bedside  Disposition Plan:  Inpatient for IV antibiotic   Antimicrobials:  Meropenem    Subjective: Pt says that she is feeling better.    Objective: Vitals:   05/03/18 2335 05/04/18 0222 05/04/18 0505 05/04/18 0949  BP:   (!) 88/60 (!) 144/122  Pulse:   78 82  Resp:   18   Temp:   97.8 F (36.6 C) 99.4 F (37.4 C)  TempSrc:   Oral Oral  SpO2: 99%  100% 99%  Weight:  53.9 kg      Intake/Output Summary (Last 24 hours) at 05/04/2018 1307 Last data filed at 05/04/2018 0650 Gross per 24 hour  Intake 891.02 ml  Output -  Net 891.02 ml   Filed Weights   05/04/18 0222  Weight: 53.9 kg   REVIEW OF SYSTEMS  As per history otherwise all reviewed and reported negative  Exam:  General exam: awake, alert, NAD.  Respiratory system: Clear. No increased work of  breathing. Cardiovascular system: S1 & S2 heard, RRR. No JVD, murmurs, gallops, clicks or pedal edema. Gastrointestinal system: Abdomen is nondistended, soft and nontender. Normal bowel sounds heard. Central nervous system: Alert and oriented. No focal neurological deficits. Extremities: no CCE.  Data Reviewed: Basic Metabolic Panel: Recent Labs  Lab 05/03/18 1954 05/04/18 0507 05/04/18 0554  NA 145 146*  --   K 3.9 3.1*  --   CL 101 109  --   CO2 34* 32  --   GLUCOSE 83 103*  --   BUN 16 13  --   CREATININE 0.69 0.59  --   CALCIUM 8.7* 7.5*  --   MG  --   --  2.0   Liver Function Tests: Recent Labs  Lab 05/03/18 1954  AST 29  ALT 16  ALKPHOS 63  BILITOT 0.9  PROT 7.4  ALBUMIN 2.6*   No results for input(s): LIPASE, AMYLASE in the last 168 hours. No results for input(s): AMMONIA in the last 168 hours. CBC: Recent Labs  Lab 05/03/18 1954 05/04/18 0507  WBC 7.3 7.2  NEUTROABS 5.0  --   HGB 11.1* 8.1*  HCT 38.0 28.0*  MCV 102.7* 103.7*  PLT 133* 81*   Cardiac Enzymes: No results for input(s): CKTOTAL, CKMB, CKMBINDEX, TROPONINI in the last 168 hours. CBG (last 3)  Recent Labs    05/04/18 0442 05/04/18 0732 05/04/18 1136  GLUCAP 127* 84 190*   Recent  Results (from the past 240 hour(s))  Blood Culture (routine x 2)     Status: None (Preliminary result)   Collection Time: 05/03/18  7:54 PM  Result Value Ref Range Status   Specimen Description BLOOD  Final   Special Requests NONE  Final   Culture   Final    NO GROWTH < 12 HOURS Performed at [facility], [address], [place], [place] [address]    Report Status PENDING  Incomplete  Blood Culture (routine x 2)     Status: None (Preliminary result)   Collection Time: 05/03/18  8:07 PM  Result Value Ref Range Status   Specimen Description BLOOD  Final   Special Requests NONE  Final   Culture   Final    NO GROWTH < 12 HOURS Performed at [facility], [address], [place], [place] [address]     Report Status PENDING  Incomplete  MRSA PCR Screening     Status: None   Collection Time: 05/04/18 12:46 AM  Result Value Ref Range Status   MRSA by PCR NEGATIVE NEGATIVE Final    Comment:        The GeneXpert MRSA Assay (FDA approved for NASAL specimens only), is one component of a comprehensive MRSA colonization surveillance program. It is not intended to diagnose MRSA infection nor to guide or monitor treatment for MRSA infections. Performed at [facility], [address] [place], [place] [address]      Studies: Dg Chest [facility] 1 View  Result Date: 05/03/2018 CLINICAL DATA:  62 y/o  F; fever. Code sepsis. EXAM: PORTABLE CHEST 1 VIEW COMPARISON:  04/10/2018 chest radiograph FINDINGS: Stable cardiac silhouette given projection and technique. Lung apices partially obscured by patient's chin. No consolidation, effusion, or pneumothorax identified. Bones are unremarkable. IMPRESSION: No acute pulmonary process identified. Electronically Signed   By: Mitzi Hansen M.D.   On: 05/03/2018 20:27     Scheduled Meds: . aspirin EC  81 mg Oral Daily  . divalproex  750 mg Oral BID  . enoxaparin (LOVENOX) injection  40 mg Subcutaneous Q24H  . famotidine  20 mg Oral Daily  . levETIRAcetam  500 mg Oral BID  . mouth rinse  15 mL Mouth Rinse BID  . mirtazapine  15 mg Oral QHS  . polyethylene glycol  17 g Oral BID  . sertraline  50 mg Oral Daily  . sodium chloride flush  3 mL Intravenous Q12H  . tiZANidine  2 mg Oral BID   Continuous Infusions: . dextrose 5 % and 0.9 % NaCl with KCl 20 mEq/L 125 mL/hr at 05/04/18 0648  . meropenem (MERREM) IV 1 g (05/04/18 0650)  . sodium chloride      Principal Problem:   Sepsis secondary to UTI [facility]) Active Problems:   Hypertension   Seizure disorder (HCC)   History of completed stroke   History of ESBL E. coli infection   Time spent:   Standley Dakins, MD, FAAFP [facility] Pager [phone]  If 7PM-7AM, please contact  night-coverage [email or web address] Password [email or web address] 05/04/2018, 1:07 PM    LOS: 1 day

## 2018-05-05 ENCOUNTER — Encounter (HOSPITAL_COMMUNITY): Payer: Self-pay | Admitting: Family Medicine

## 2018-05-05 ENCOUNTER — Inpatient Hospital Stay (HOSPITAL_COMMUNITY): Payer: Medicaid Other

## 2018-05-05 LAB — RENAL FUNCTION PANEL
ALBUMIN: 2.2 g/dL — AB (ref 3.5–5.0)
ANION GAP: 7 (ref 5–15)
BUN: <5 mg/dL — ABNORMAL LOW (ref 8–23)
CALCIUM: 8.1 mg/dL — AB (ref 8.9–10.3)
CO2: 29 mmol/L (ref 22–32)
CREATININE: 0.42 mg/dL — AB (ref 0.44–1.00)
Chloride: 110 mmol/L (ref 98–111)
GFR calc non Af Amer: >60 mL/min (ref >60)
GLUCOSE: 134 mg/dL — AB (ref 70–99)
PHOSPHORUS: 1.6 mg/dL — AB (ref 2.5–4.6)
Potassium: 3.5 mmol/L (ref 3.5–5.1)
SODIUM: 146 mmol/L — AB (ref 135–145)

## 2018-05-05 LAB — CBC WITH DIFFERENTIAL/PLATELET
Abs Immature Granulocytes: 0.13 10*3/uL — ABNORMAL HIGH (ref 0.00–0.07)
BASOS ABS: 0 10*3/uL (ref 0.0–0.1)
Basophils Relative: 1 %
EOS PCT: 2 %
Eosinophils Absolute: 0.2 10*3/uL (ref 0.0–0.5)
HEMATOCRIT: 32.1 % — AB (ref 36.0–46.0)
Hemoglobin: 9.2 g/dL — ABNORMAL LOW (ref 12.0–15.0)
Immature Granulocytes: 2 %
LYMPHS ABS: 1.4 10*3/uL (ref 0.7–4.0)
Lymphocytes Relative: 21 %
MCH: 29.7 pg (ref 26.0–34.0)
MCHC: 28.7 g/dL — AB (ref 30.0–36.0)
MCV: 103.5 fL — ABNORMAL HIGH (ref 80.0–100.0)
Monocytes Absolute: 0.8 10*3/uL (ref 0.1–1.0)
Monocytes Relative: 12 %
NRBC: 0 % (ref 0.0–0.2)
Neutro Abs: 4.1 10*3/uL (ref 1.7–7.7)
Neutrophils Relative %: 62 %
Platelets: 93 10*3/uL — ABNORMAL LOW (ref 150–400)
RBC: 3.1 MIL/uL — ABNORMAL LOW (ref 3.87–5.11)
RDW: 16.9 % — ABNORMAL HIGH (ref 11.5–15.5)
WBC: 6.6 10*3/uL (ref 4.0–10.5)

## 2018-05-05 LAB — GLUCOSE, CAPILLARY: GLUCOSE-CAPILLARY: 114 mg/dL — AB (ref 70–99)

## 2018-05-05 LAB — MAGNESIUM: Magnesium: 1.5 mg/dL — ABNORMAL LOW (ref 1.7–2.4)

## 2018-05-05 MED ORDER — K PHOS MONO-SOD PHOS DI & MONO 155-852-130 MG PO TABS
250.0000 mg | ORAL_TABLET | Freq: Two times a day (BID) | ORAL | Status: AC
  Administered 2018-05-05 – 2018-05-06 (×4): 250 mg via ORAL
  Filled 2018-05-05 (×4): qty 1

## 2018-05-05 MED ORDER — MAGNESIUM SULFATE 2 GM/50ML IV SOLN
2.0000 g | Freq: Once | INTRAVENOUS | Status: AC
  Administered 2018-05-05: 2 g via INTRAVENOUS
  Filled 2018-05-05: qty 50

## 2018-05-05 MED ORDER — POTASSIUM CHLORIDE IN NACL 20-0.45 MEQ/L-% IV SOLN: INTRAVENOUS | Status: DC

## 2018-05-05 MED ORDER — AMLODIPINE BESYLATE 5 MG PO TABS
10.0000 mg | ORAL_TABLET | Freq: Every day | ORAL | Status: DC
  Administered 2018-05-05 – 2018-05-06 (×2): 10 mg via ORAL
  Filled 2018-05-05 (×2): qty 2

## 2018-05-05 MED ORDER — SODIUM CHLORIDE 0.45 % IV SOLN
INTRAVENOUS | Status: DC
  Administered 2018-05-05: 10:00:00 via INTRAVENOUS

## 2018-05-05 NOTE — Progress Notes (Signed)
PROGRESS NOTE    Lisa [name]  UEA:[id number]  DOB: July 30, 1955  DOA: 05/03/2018 PCP: Audree Bane, DO   Brief Admission Hx: Lisa [name] is a 62 y.o. female with medical history significant for adjustment disorder, hypertension, history of CVA, left-sided spastic hemiplegia, aphasia, ESBL E. coli bacteremia earlier this year, and recent admission with GI bleeding, now presenting to the emergency department for evaluation of low blood pressure.  Patient is unable to contribute to the history due to her clinical condition.  She was reportedly noted to have a low blood pressure and fever at her nursing facility and directed to the ED for further evaluation of this.  MDM/Assessment & Plan: 1. Sepsis secondary to UTI - Improved. History of ESBL UTI.  Continue current therapy with meropenem until cultures are back.  Follow culture and sensitivities.  2. History of CVA with vascular dementia - continue aspirin for secondary prevention.  3. Left spastic hemiplegia / muscle spasticity - resumed zanaflex.  4. Essential hypertension - resumed home amlodipine as BPs have started rising.    5. Hypotension - resolved now, secondary to sepsis/infection. 6. Epilepsy - continue home meds.   7. Depression - continue zoloft and remeron.  8. Hypernatremia - changed IV fluids to 1/2 NS.  9. Low phosphorus - K-phos ordered.   10. Hypomagnesemia - IV magnesium ordered.  Recheck in AM.  11. Hypokalemia - repleted.   DVT prophylaxis: lovenox  Code Status: DNR  Family Communication: patient updated at bedside  Disposition Plan:  Inpatient for IV antibiotics  Antimicrobials:  Meropenem    Subjective: Pt says that she is feeling better and ready to eat breakfast.   Objective: Vitals:   05/04/18 0949 05/04/18 2122 05/04/18 2200 05/05/18 0600  BP: (!) 144/122 (!) 158/106  (!) 148/96  Pulse: 82 (!) 137 94 88  Resp:  20  18  Temp: 99.4 F (37.4 C)   98.5 F (36.9 C)  TempSrc: Oral   Oral    SpO2: 99% 100%  95%  Weight:        Intake/Output Summary (Last 24 hours) at 05/05/2018 0919 Last data filed at 05/05/2018 0849 Gross per 24 hour  Intake 3179.41 ml  Output 1550 ml  Net 1629.41 ml   Filed Weights   05/04/18 0222  Weight: 53.9 kg   REVIEW OF SYSTEMS  As per history otherwise all reviewed and reported negative  Exam:  General exam: awake, alert, NAD.  Respiratory system: Clear. No increased work of breathing. Cardiovascular system: S1 & S2 heard, RRR. No JVD, murmurs, gallops, clicks or pedal edema. Gastrointestinal system: Abdomen is nondistended, soft and nontender. Normal bowel sounds heard. Central nervous system: Alert and oriented. No focal neurological deficits. Extremities: no CCE.  Data Reviewed: Basic Metabolic Panel: Recent Labs  Lab 05/03/18 1954 05/04/18 0507 05/04/18 0554 05/05/18 0624  NA 145 146*  --  146*  K 3.9 3.1*  --  3.5  CL 101 109  --  110  CO2 34* 32  --  29  GLUCOSE 83 103*  --  134*  BUN 16 13  --  <5*  CREATININE 0.69 0.59  --  0.42*  CALCIUM 8.7* 7.5*  --  8.1*  MG  --   --  2.0 1.5*  PHOS  --   --   --  1.6*   Liver Function Tests: Recent Labs  Lab 05/03/18 1954 05/05/18 0624  AST 29  --   ALT 16  --   ALKPHOS  63  --   BILITOT 0.9  --   PROT 7.4  --   ALBUMIN 2.6* 2.2*   No results for input(s): LIPASE, AMYLASE in the last 168 hours. No results for input(s): AMMONIA in the last 168 hours. CBC: Recent Labs  Lab 05/03/18 1954 05/04/18 0507 05/05/18 0624  WBC 7.3 7.2 6.6  NEUTROABS 5.0  --  4.1  HGB 11.1* 8.1* 9.2*  HCT 38.0 28.0* 32.1*  MCV 102.7* 103.7* 103.5*  PLT 133* 81* 93*   Cardiac Enzymes: No results for input(s): CKTOTAL, CKMB, CKMBINDEX, TROPONINI in the last 168 hours. CBG (last 3)  Recent Labs    05/04/18 1136 05/04/18 1613 05/04/18 2019  GLUCAP 190* 105* 139*   Recent Results (from the past 240 hour(s))  Blood Culture (routine x 2)     Status: None (Preliminary result)    Collection Time: 05/03/18  7:54 PM  Result Value Ref Range Status   Specimen Description BLOOD  Final   Special Requests NONE  Final   Culture   Final    NO GROWTH 2 DAYS Performed at [facility], [address] [place], [place] [address]    Report Status PENDING  Incomplete  Blood Culture (routine x 2)     Status: None (Preliminary result)   Collection Time: 05/03/18  8:07 PM  Result Value Ref Range Status   Specimen Description BLOOD  Final   Special Requests NONE  Final   Culture   Final    NO GROWTH 2 DAYS Performed at [facility], [address] [place], [place] [address]    Report Status PENDING  Incomplete  MRSA PCR Screening     Status: None   Collection Time: 05/04/18 12:46 AM  Result Value Ref Range Status   MRSA by PCR NEGATIVE NEGATIVE Final    Comment:        The GeneXpert MRSA Assay (FDA approved for NASAL specimens only), is one component of a comprehensive MRSA colonization surveillance program. It is not intended to diagnose MRSA infection nor to guide or monitor treatment for MRSA infections. Performed at [facility], [address], [place], [place] [address]      Studies: Dg Chest [facility] 1 View  Result Date: 05/03/2018 CLINICAL DATA:  62 y/o  F; fever. Code sepsis. EXAM: PORTABLE CHEST 1 VIEW COMPARISON:  04/10/2018 chest radiograph FINDINGS: Stable cardiac silhouette given projection and technique. Lung apices partially obscured by patient's chin. No consolidation, effusion, or pneumothorax identified. Bones are unremarkable. IMPRESSION: No acute pulmonary process identified. Electronically Signed   By: Mitzi Hansen M.D.   On: 05/03/2018 20:27   Scheduled Meds: . aspirin EC  81 mg Oral Daily  . divalproex  750 mg Oral BID  . enoxaparin (LOVENOX) injection  40 mg Subcutaneous Q24H  . famotidine  20 mg Oral Daily  . levETIRAcetam  500 mg Oral BID  . mouth rinse  15 mL Mouth Rinse BID  . mirtazapine  15 mg Oral QHS  . phosphorus  250 mg  Oral BID  . polyethylene glycol  17 g Oral BID  . sertraline  50 mg Oral Daily  . sodium chloride flush  3 mL Intravenous Q12H  . tiZANidine  2 mg Oral BID   Continuous Infusions: . sodium chloride    . magnesium sulfate 1 - 4 g bolus IVPB    . meropenem (MERREM) IV 1 g (05/05/18 0649)  . sodium chloride     Principal Problem:  Sepsis secondary to UTI [facility]) Active Problems:   Hypertension   Seizure disorder (HCC)   History of completed stroke   History of ESBL E. coli infection  Time spent:   Standley Dakins, MD, FAAFP [facility] Pager [phone]  If 7PM-7AM, please contact night-coverage [email or web address] Password [email or web address] 05/05/2018, 9:19 AM    LOS: 2 days

## 2018-05-05 NOTE — Progress Notes (Signed)
05/05/2018 2:11 PM  Update:  RN reporting that patient having a lot of food in back of mouth and now swallowing well.  Will change to dysphagia diet.  Aspiration precautions.  Obtain SLP evaluation for swallowing.  Obtain portable CXR.    Maryln Manuel MD

## 2018-05-06 ENCOUNTER — Inpatient Hospital Stay (HOSPITAL_COMMUNITY): Payer: Medicaid Other

## 2018-05-06 DIAGNOSIS — N3000 Acute cystitis without hematuria: Secondary | ICD-10-CM

## 2018-05-06 DIAGNOSIS — G9340 Encephalopathy, unspecified: Secondary | ICD-10-CM | POA: Diagnosis not present

## 2018-05-06 LAB — RENAL FUNCTION PANEL
ALBUMIN: 2.1 g/dL — AB (ref 3.5–5.0)
ANION GAP: 7 (ref 5–15)
BUN: <5 mg/dL — ABNORMAL LOW (ref 8–23)
CALCIUM: 8.3 mg/dL — AB (ref 8.9–10.3)
CO2: 32 mmol/L (ref 22–32)
Chloride: 104 mmol/L (ref 98–111)
Creatinine, Ser: 0.4 mg/dL — ABNORMAL LOW (ref 0.44–1.00)
GFR calc Af Amer: >60 mL/min (ref >60)
GFR calc non Af Amer: >60 mL/min (ref >60)
Glucose, Bld: 83 mg/dL (ref 70–99)
PHOSPHORUS: 2.4 mg/dL — AB (ref 2.5–4.6)
Potassium: 3.7 mmol/L (ref 3.5–5.1)
SODIUM: 143 mmol/L (ref 135–145)

## 2018-05-06 LAB — GLUCOSE, CAPILLARY
GLUCOSE-CAPILLARY: 101 mg/dL — AB (ref 70–99)
GLUCOSE-CAPILLARY: 77 mg/dL (ref 70–99)
GLUCOSE-CAPILLARY: 81 mg/dL (ref 70–99)
Glucose-Capillary: 72 mg/dL (ref 70–99)
Glucose-Capillary: 65 mg/dL — ABNORMAL LOW (ref 70–99)

## 2018-05-06 LAB — URINE CULTURE: Culture: >=100000 — AB

## 2018-05-06 LAB — BLOOD GAS, ARTERIAL
Acid-Base Excess: 7.2 mmol/L — ABNORMAL HIGH (ref 0.0–2.0)
BICARBONATE: 30.9 mmol/L — AB (ref 20.0–28.0)
DRAWN BY: 28459
FIO2: 21
O2 Saturation: 93.2 %
Patient temperature: 37
pCO2 arterial: 43.3 mmHg (ref 32.0–48.0)
pH, Arterial: 7.47 — ABNORMAL HIGH (ref 7.350–7.450)
pO2, Arterial: 67.6 mmHg — ABNORMAL LOW (ref 83.0–108.0)

## 2018-05-06 LAB — CBC WITH DIFFERENTIAL/PLATELET
Abs Immature Granulocytes: 0.14 10*3/uL — ABNORMAL HIGH (ref 0.00–0.07)
BASOS ABS: 0.1 10*3/uL (ref 0.0–0.1)
Basophils Relative: 1 %
EOS ABS: 0.2 10*3/uL (ref 0.0–0.5)
EOS PCT: 4 %
HCT: 32 % — ABNORMAL LOW (ref 36.0–46.0)
Hemoglobin: 9.7 g/dL — ABNORMAL LOW (ref 12.0–15.0)
Immature Granulocytes: 2 %
Lymphocytes Relative: 27 %
Lymphs Abs: 1.9 10*3/uL (ref 0.7–4.0)
MCH: 30.9 pg (ref 26.0–34.0)
MCHC: 30.3 g/dL (ref 30.0–36.0)
MCV: 101.9 fL — ABNORMAL HIGH (ref 80.0–100.0)
Monocytes Absolute: 0.7 10*3/uL (ref 0.1–1.0)
Monocytes Relative: 10 %
NRBC: 0 % (ref 0.0–0.2)
Neutro Abs: 3.9 10*3/uL (ref 1.7–7.7)
Neutrophils Relative %: 56 %
Platelets: 106 10*3/uL — ABNORMAL LOW (ref 150–400)
RBC: 3.14 MIL/uL — AB (ref 3.87–5.11)
RDW: 16.6 % — ABNORMAL HIGH (ref 11.5–15.5)
WBC: 6.9 10*3/uL (ref 4.0–10.5)

## 2018-05-06 LAB — VALPROIC ACID LEVEL: Valproic Acid Lvl: <10 ug/mL — ABNORMAL LOW (ref 50.0–100.0)

## 2018-05-06 LAB — MAGNESIUM: Magnesium: 1.8 mg/dL (ref 1.7–2.4)

## 2018-05-06 LAB — AMMONIA: Ammonia: 24 umol/L (ref 9–35)

## 2018-05-06 MED ORDER — DEXTROSE-NACL 5-0.9 % IV SOLN
INTRAVENOUS | Status: DC
  Administered 2018-05-06 – 2018-05-08 (×3): via INTRAVENOUS

## 2018-05-06 MED ORDER — VALPROATE SODIUM 500 MG/5ML IV SOLN
500.0000 mg | Freq: Three times a day (TID) | INTRAVENOUS | Status: DC
  Administered 2018-05-07: 500 mg via INTRAVENOUS
  Filled 2018-05-06 (×10): qty 5

## 2018-05-06 MED ORDER — DIVALPROEX SODIUM 125 MG PO CSDR
500.0000 mg | DELAYED_RELEASE_CAPSULE | Freq: Three times a day (TID) | ORAL | Status: DC
  Filled 2018-05-06 (×7): qty 4

## 2018-05-06 MED ORDER — CIPROFLOXACIN IN D5W 400 MG/200ML IV SOLN
400.0000 mg | Freq: Two times a day (BID) | INTRAVENOUS | Status: DC
  Administered 2018-05-06 – 2018-05-07 (×3): 400 mg via INTRAVENOUS
  Filled 2018-05-06 (×3): qty 200

## 2018-05-06 MED ORDER — SODIUM CHLORIDE 0.9 % IV BOLUS
500.0000 mL | Freq: Once | INTRAVENOUS | Status: AC
  Administered 2018-05-06: 500 mL via INTRAVENOUS

## 2018-05-06 MED ORDER — ASPIRIN 81 MG PO CHEW
81.0000 mg | CHEWABLE_TABLET | Freq: Every day | ORAL | Status: DC
  Administered 2018-05-07 – 2018-05-09 (×3): 81 mg via ORAL
  Filled 2018-05-06 (×3): qty 1

## 2018-05-06 MED ORDER — LEVETIRACETAM IN NACL 500 MG/100ML IV SOLN
500.0000 mg | INTRAVENOUS | Status: AC
  Administered 2018-05-06: 500 mg via INTRAVENOUS
  Filled 2018-05-06: qty 100

## 2018-05-06 MED ORDER — VALPROATE SODIUM 500 MG/5ML IV SOLN
1000.0000 mg | Freq: Once | INTRAVENOUS | Status: AC
  Administered 2018-05-06: 1000 mg via INTRAVENOUS
  Filled 2018-05-06: qty 10

## 2018-05-06 MED ORDER — LEVETIRACETAM IN NACL 500 MG/100ML IV SOLN
500.0000 mg | Freq: Two times a day (BID) | INTRAVENOUS | Status: DC
  Administered 2018-05-06 – 2018-05-08 (×4): 500 mg via INTRAVENOUS
  Filled 2018-05-06 (×4): qty 100

## 2018-05-06 MED ORDER — VALPROATE SODIUM 500 MG/5ML IV SOLN
INTRAVENOUS | Status: AC
  Filled 2018-05-06: qty 10

## 2018-05-06 NOTE — Progress Notes (Signed)
Patient takes medications crushed with applesauce. Noted to have enteric coated aspirin and delayed release depakote ordered. Notified Dr. Kerry Hough to see if they can be changed to something we can crush. Pt tolerated small bites of applesauce well with sips of water. Aspiration precautions in place. Earnstine Regal, RN

## 2018-05-06 NOTE — Progress Notes (Addendum)
PROGRESS NOTE    Lisa [name]  ZOX:[id number]  DOB: February 13, 1956  DOA: 05/03/2018 PCP: Audree Bane, DO   Brief Admission Hx: Lisa [name] is a 62 y.o. female with medical history significant for adjustment disorder, hypertension, history of CVA, left-sided spastic hemiplegia, aphasia, ESBL E. coli bacteremia earlier this year, and recent admission with GI bleeding, now presenting to the emergency department for evaluation of low blood pressure.  Patient is unable to contribute to the history due to her clinical condition.  She was reportedly noted to have a low blood pressure and fever at her nursing facility and directed to the ED for further evaluation of this.  MDM/Assessment & Plan: 1. Acute encephalopathy.  Etiology is unclear.  She was doing well this morning at approximately 11 AM.  At the time of my evaluation around noon, she was lethargic and unresponsive.  Stat CT scan was ordered and by 2 PM, she was back to her baseline per nursing staff.  Through the course of the day, she had repeated episodes of lethargy.  CT head shows remote stroke in the right frontal lobe, smaller foci of chronic or late subacute infarct left frontal lobe.  No acute ischemic findings are mentioned.  She is on chronic seizure medications.  EEG has been ordered.  ABG did not show any elevated PCO2 and pH is normal.  Labs from this morning are unrevealing.  She has a glucose in the 70s which has been stable.  Zanaflex has been discontinued.  Case discussed with tele-neurologist who felt that symptoms were likely related to missed seizure events.  Agreed with EEG.  Recommended giving Keppra 1000 mg x 1 now as well as Depakote 1000 mg x 1.  We will add Depakote level to morning labs.  Can consider MRI imaging tomorrow.  With patient's atypical symptoms, fluctuating mental status, and remote to subacute findings on CT head, he felt the patient was not a candidate for TPA. 2. sepsis secondary to UTI - Improved.  History of ESBL UTI.  Based on cultures, meropenem has been changed to ciprofloxacin.  3. History of CVA with vascular dementia - continue aspirin for secondary prevention.  4. Left spastic hemiplegia / muscle spasticity -Zanaflex discontinued due to mental status changes.  5. Essential hypertension -chronically on amlodipine, this is been discontinued due to low blood pressures 6. Hypotension -patient had another episode of hypotension today.  This improved with fluid bolus.  Amlodipine discontinued.  Continue to monitor. 7. Epilepsy -change Keppra and Depakote to IV.  EEG has been ordered. 8. Depression - continue zoloft and remeron.  9. Hypernatremia -resolved with hypotonic fluids 10. Low phosphorus - K-phos ordered.   11. Hypomagnesemia -improved with replacement.  12. Hypokalemia - repleted.   DVT prophylaxis: lovenox  Code Status: DNR  Family Communication: No family present, sister who is power of attorney was updated over the phone Disposition Plan:  Inpatient for IV antibiotics  Antimicrobials:  Meropenem 11/1 > 11/4  Ciprofloxacin 11/4 >  Subjective: She is lethargic today.  She has had intermittent episodes of improvement in her mental status, but will become lethargic again.  Objective: Vitals:   05/06/18 1317 05/06/18 1318 05/06/18 1500 05/06/18 1625  BP:  110/78 105/80 115/70  Pulse: 62 60  69  Resp: 16 16  16   Temp:  98.2 F (36.8 C)  98.5 F (36.9 C)  TempSrc:  Axillary  Oral  SpO2: 100% 100%  99%  Weight:  Intake/Output Summary (Last 24 hours) at 05/06/2018 1801 Last data filed at 05/06/2018 1626 Gross per 24 hour  Intake 1703.9 ml  Output 2501 ml  Net -797.1 ml   Filed Weights   05/04/18 0222  Weight: 53.9 kg   Exam:  General exam: somnolent, wakes up to voice and follows some commands, but then falls back asleep Respiratory system: Clear to auscultation. Respiratory effort normal. Cardiovascular system:RRR. No murmurs, rubs,  gallops. Gastrointestinal system: Abdomen is nondistended, soft and nontender. No organomegaly or masses felt. Normal bowel sounds heard. Central nervous system: difficult exam since patient does not participate she has chronic LLE weakness from prior stroke. No facial asymmetry Extremities: No C/C/E, +pedal pulses Skin: No rashes, lesions or ulcers Psychiatry: lethargic    Data Reviewed: Basic Metabolic Panel: Recent Labs  Lab 05/03/18 1954 05/04/18 0507 05/04/18 0554 05/05/18 0624 05/06/18 0529  NA 145 146*  --  146* 143  K 3.9 3.1*  --  3.5 3.7  CL 101 109  --  110 104  CO2 34* 32  --  29 32  GLUCOSE 83 103*  --  134* 83  BUN 16 13  --  <5* <5*  CREATININE 0.69 0.59  --  0.42* 0.40*  CALCIUM 8.7* 7.5*  --  8.1* 8.3*  MG  --   --  2.0 1.5* 1.8  PHOS  --   --   --  1.6* 2.4*   Liver Function Tests: Recent Labs  Lab 05/03/18 1954 05/05/18 0624 05/06/18 0529  AST 29  --   --   ALT 16  --   --   ALKPHOS 63  --   --   BILITOT 0.9  --   --   PROT 7.4  --   --   ALBUMIN 2.6* 2.2* 2.1*   No results for input(s): LIPASE, AMYLASE in the last 168 hours. No results for input(s): AMMONIA in the last 168 hours. CBC: Recent Labs  Lab 05/03/18 1954 05/04/18 0507 05/05/18 0624 05/06/18 0529  WBC 7.3 7.2 6.6 6.9  NEUTROABS 5.0  --  4.1 3.9  HGB 11.1* 8.1* 9.2* 9.7*  HCT 38.0 28.0* 32.1* 32.0*  MCV 102.7* 103.7* 103.5* 101.9*  PLT 133* 81* 93* 106*   Cardiac Enzymes: No results for input(s): CKTOTAL, CKMB, CKMBINDEX, TROPONINI in the last 168 hours. CBG (last 3)  Recent Labs    05/05/18 2007 05/06/18 0011 05/06/18 1603  GLUCAP 114* 81 77   Recent Results (from the past 240 hour(s))  Blood Culture (routine x 2)     Status: None (Preliminary result)   Collection Time: 05/03/18  7:54 PM  Result Value Ref Range Status   Specimen Description BLOOD RIGHT ARM  Final   Special Requests   Final    BOTTLES DRAWN AEROBIC AND ANAEROBIC Blood Culture adequate volume    Culture   Final    NO GROWTH 3 DAYS Performed at [facility], [address], [place], [place] [address]    Report Status PENDING  Incomplete  Blood Culture (routine x 2)     Status: None (Preliminary result)   Collection Time: 05/03/18  8:07 PM  Result Value Ref Range Status   Specimen Description BLOOD LEFT ARM  Final   Special Requests   Final    BOTTLES DRAWN AEROBIC AND ANAEROBIC Blood Culture adequate volume   Culture   Final    NO GROWTH 3 DAYS Performed at [facility], [address] [place], [place] [address]  Report Status PENDING  Incomplete  Urine culture     Status: Abnormal   Collection Time: 05/03/18  8:51 PM  Result Value Ref Range Status   Specimen Description   Final    URINE, CLEAN CATCH Performed at [facility], [address], [place], [place] [address]    Special Requests   Final    NONE Performed at [facility], [address], [place], [place] [address]    Culture >=100,000 COLONIES/mL STAPHYLOCOCCUS SIMULANS (A)  Final   Report Status 05/06/2018 FINAL  Final   Organism ID, Bacteria STAPHYLOCOCCUS SIMULANS (A)  Final      Susceptibility   Staphylococcus simulans - MIC*    CIPROFLOXACIN <=0.5 SENSITIVE Sensitive     GENTAMICIN <=0.5 SENSITIVE Sensitive     NITROFURANTOIN <=16 SENSITIVE Sensitive     OXACILLIN <=0.25 SENSITIVE Sensitive     TETRACYCLINE <=1 SENSITIVE Sensitive     VANCOMYCIN <=0.5 SENSITIVE Sensitive     TRIMETH/SULFA <=10 SENSITIVE Sensitive     CLINDAMYCIN RESISTANT Resistant     RIFAMPIN <=0.5 SENSITIVE Sensitive     Inducible Clindamycin POSITIVE Resistant     * >=100,000 COLONIES/mL STAPHYLOCOCCUS SIMULANS  MRSA PCR Screening     Status: None   Collection Time: 05/04/18 12:46 AM  Result Value Ref Range Status   MRSA by PCR NEGATIVE NEGATIVE Final    Comment:        The GeneXpert MRSA Assay (FDA approved for NASAL specimens only), is one component of a comprehensive MRSA colonization surveillance program. It is  not intended to diagnose MRSA infection nor to guide or monitor treatment for MRSA infections. Performed at [facility], [address], [place], [place] [address]      Studies: Ct Head Wo Contrast  Result Date: 05/06/2018 CLINICAL DATA:  Altered mental status. Altered level of consciousness. EXAM: CT HEAD WITHOUT CONTRAST TECHNIQUE: Contiguous axial images were obtained from the base of the skull through the vertex without intravenous contrast. COMPARISON:  None. FINDINGS: Brain: Extensive streak artifact from metal density along the right sellar region., probably related to prior cerebral aneurysm repair. The cerebellum appears normal. Remote encephalomalacia in the right frontal lobe with some evidence of Wallerian degeneration and reduced size of the right cerebral peduncle resulting. Thin corpus callosum and suspected remote infarct involving the head of the right caudate nucleus. Periventricular white matter and corona radiata hypodensities favor chronic ischemic microvascular white matter disease. Mildly confluent white matter hypodensity and focal loss of gray-white junction in the left frontal lobe on image 24/4 and image 25/2. This could be a chronic or subacute left frontal lobe infarct. Upper normal size of the ventricular systems and temporal horns. No intracranial hemorrhage or mass lesion is identified. Vascular: Streak artifact obscures the middle cerebral arteries. There is atherosclerotic calcification of the cavernous carotid arteries bilaterally. Skull: Suspected remote left frontal burr hole on image 52/3. No acute calvarial findings. Sinuses/Orbits: Acute on chronic right maxillary sinusitis with an air-fluid level. Chronic ethmoid, sphenoid, left frontal, and left maxillary sinusitis. Other: No supplemental non-categorized findings. IMPRESSION: 1. Remote stroke in the right frontal lobe with underlying chronic ischemic microvascular white matter disease. 2. Smaller focal chronic or  late subacute infarct in the left frontal lobe. MRI could be utilized to assess chronicity or for occult acute stroke if clinically warranted. 3. Streak artifact from metal density along the right sellar region likely related to prior cerebral aneurysm repair. 4. Acute on chronic right maxillary sinusitis with air-level.  Other chronic paranasal sinusitis is noted. Electronically Signed   By: Gaylyn Rong M.D.   On: 05/06/2018 14:15   Dg Chest Port 1 View  Result Date: 05/05/2018 CLINICAL DATA:  62 year old female with a history of chest congestion and possible aspiration. EXAM: PORTABLE CHEST 1 VIEW COMPARISON:  05/03/2018 FINDINGS: Cardiomediastinal silhouette unchanged in size and contour. Low lung volumes with linear opacities at the bilateral lung bases. No pneumothorax. No pleural effusion. No displaced fracture. IMPRESSION: Low lung volumes with linear opacities, potentially atelectasis/consolidation. Electronically Signed   By: Gilmer Mor D.O.   On: 05/05/2018 14:49   Scheduled Meds: . aspirin  81 mg Oral Daily  . enoxaparin (LOVENOX) injection  40 mg Subcutaneous Q24H  . famotidine  20 mg Oral Daily  . mouth rinse  15 mL Mouth Rinse BID  . mirtazapine  15 mg Oral QHS  . phosphorus  250 mg Oral BID  . polyethylene glycol  17 g Oral BID  . sertraline  50 mg Oral Daily  . sodium chloride flush  3 mL Intravenous Q12H   Continuous Infusions: . ciprofloxacin Stopped (05/06/18 1215)  . dextrose 5 % and 0.9% NaCl    . levETIRAcetam    . valproate sodium     Principal Problem:   Sepsis secondary to UTI [facility]) Active Problems:   Hypertension   Seizure disorder (HCC)   History of completed stroke   History of ESBL E. coli infection  Time spent:  Erick Blinks, MD [facility] Pager [phone]  If 7PM-7AM, please contact night-coverage [email or web address] Password [email or web address] 05/06/2018, 6:01 PM    LOS: 3 days

## 2018-05-06 NOTE — Progress Notes (Signed)
Initial Nutrition Assessment  DOCUMENTATION CODES:  Not applicable  INTERVENTION:  Will order appropriate oral supplements pending slp evaluation/MBS  NUTRITION DIAGNOSIS:  Swallowing difficulty related to Past CVA? as evidenced by RN reporting pt holding food in mouth/not swallowing and subsequent SLP consultation w/ pending MBS.  GOAL:  Patient will meet greater than or equal to 90% of their needs  MONITOR:  Supplement acceptance, PO intake, Diet advancement, Labs, I & O's  REASON FOR ASSESSMENT:  Consult Assessment of nutrition requirement/status  ASSESSMENT:  62 y/o female PMHx adjustment disorder, HTN, CVA, L side hemiplegia, aphasia. Recent admission 10/9-10/12 for GIB. Presents from SNF d/t hypotension and fever. Workup revealed likely UTI related sepsis. Admitted for management.   This AM RN apparently had noted pt was not swallowing well. Currently, pt on D1 diet w/ NTL diet. SLP eval pending via MBS  Patient does not appear to be good historian. She says her UBW is 101 lbs. She says she eats well at SNF and reports she is on a regular diet there. She also says she drinks ~1 Ensure/day there. She reports UBW is 101 lbs? Functionally, she says she walks all the time.   Per chart, she appears to have been 120-125 lbs this past March and 125.4 lbs when she was admitted 1 month ago. This admission, weight was 118.8 lbs, indicating a loss of ~7 lbs x 78month This is a loss of 5% bw, which meets malnutrition criteria, though this is the only criterion met at this time.   Will order appropriate supplements pending her MBS results.   Labs: H/H:9.7/32, Phos:2.4, Albumin:2.1, BGs:80-140 Meds: Pepcid, Keppra, Miralax, Phos, Remeron, IVF,IV ABx  Recent Labs  Lab 05/04/18 0507 05/04/18 0554 05/05/18 0624 05/06/18 0529  NA 146*  --  146* 143  K 3.1*  --  3.5 3.7  CL 109  --  110 104  CO2 32  --  29 32  BUN 13  --  <5* <5*  CREATININE 0.59  --  0.42* 0.40*  CALCIUM 7.5*  --   8.1* 8.3*  MG  --  2.0 1.5* 1.8  PHOS  --   --  1.6* 2.4*  GLUCOSE 103*  --  134* 83   NUTRITION - FOCUSED PHYSICAL EXAM:   Most Recent Value  Orbital Region  No depletion  Upper Arm Region  No depletion  Thoracic and Lumbar Region  No depletion  Buccal Region  No depletion  Temple Region  No depletion  Clavicle Bone Region  No depletion  Clavicle and Acromion Bone Region  No depletion  Scapular Bone Region  No depletion  Dorsal Hand  No depletion  Patellar Region  No depletion  Anterior Thigh Region  No depletion  Posterior Calf Region  No depletion  Edema (RD Assessment)  None     Diet Order:   Diet Order            DIET - DYS 1 Room service appropriate? Yes; Fluid consistency: Nectar Thick  Diet effective now             EDUCATION NEEDS:  No education needs have been identified at this time  Skin:  Abrasion to Jaw/leg  Last BM:  11/3  Height:  Ht Readings from Last 1 Encounters:  04/10/18 '5\' 4"'$  (1.626 m)   Weight:  Wt Readings from Last 1 Encounters:  05/04/18 53.9 kg   Wt Readings from Last 10 Encounters:  05/04/18 53.9 kg  04/10/18 56.9 kg  11/22/17 52.2 kg  09/24/17 61 kg  09/15/17 56 kg   Ideal Body Weight:  54.54 kg  BMI:  Body mass index is 20.4 kg/m.  Estimated Nutritional Needs:  Kcal:  1500-1700 (28-32 kcal/kg bw) Protein:  70-80g Pro (1.3-1.5g/kg bw) Fluid:  1.5-1.7 L fluid (1 ml/kcal)  Burtis Junes RD, LDN, CNSC Clinical Nutrition Available Tues-Sat via Pager: [phone] 05/06/2018 12:41 PM

## 2018-05-06 NOTE — Progress Notes (Signed)
Patient lethargic this afternoon at 1630, responded to sternal rub and answered yes/no questions. Gripped with right hand. Unable to keep patient awake without stimulation. Vitals stable, see flowsheet. Notified Dr. Kerry Hough who stated he would see patient. After MD assessment, orders received for D5NS at 75 ml/hr since CBG 77 this evening. Keppra and Depakote changed to IV routes since unable to take po at this time. MD requested first doses start this evening.Also ordered tele-neurology consult STAT since Dr. [name] is currently out of the office and unable to complete consult this evening. Notified nursing supervisor of need for tele-neuro cart. Tele-neuro consult called via cart at 1815 once cart connected and working. Stated they will notify neurologist and consult should be completed within 20-30 minutes. Nursing remains at bedside with patient. Rechecked CBG was 72. New IV access obtained. IVF and Keppra started with new IV access. Earnstine Regal, RN

## 2018-05-06 NOTE — Progress Notes (Signed)
Patient lethargic, responsive to pain with sternal rub. Answered yes/no to questions and then back to sleep. BP 60/41 and 66/47. See vitals flowsheet. Notified Dr. Kerry Hough. Orders for NS 500 ml bolus x 1 and reassess BP. Nursing attempted manual BP 90/64. Remains lethargic. Nursing remains at bedside. MD aware of morning meds received. Earnstine Regal, RN

## 2018-05-06 NOTE — Evaluation (Signed)
Clinical/Bedside Swallow Evaluation Patient Details  Name: Lisa [name] MRN: [id number] Date of Birth: [birth year]  Today's Date: 05/06/2018 Time: SLP Start Time (ACUTE ONLY): 1010 SLP Stop Time (ACUTE ONLY): 1040 SLP Time Calculation (min) (ACUTE ONLY): 30 min  Past Medical History:  Past Medical History:  Diagnosis Date  . Abnormal posture   . Adjustment disorder   . Anemia, unspecified   . Aphasia-angular gyrus syndrome   . Arthritis, climacteric, knee, left   . Atypical psychosis (HCC)   . C. difficile diarrhea   . Cerebral arteritis   . Contracture of left ankle   . Essential hypertension, malignant   . Essential hypertension, malignant   . Hyperlipemia   . Left spastic hemiplegia (HCC)   . Muscle weakness (generalized)   . 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 Oropharyngeal dysphagia   . Other sequelae following nontraumatic subarachnoid hemorrhage   . Primary osteoarthritis of both knees   . Screening for thyroid disorder   . Screening for thyroid disorder   . Seizures (HCC)   . Sequelae of cerebral infarction   . Unsteady   . Vascular dementia with delirium [facility])    Past Surgical History:  Past Surgical History:  Procedure Laterality Date  . CEREBRAL ANEURYSM REPAIR     2017? then suffered stroke   HPI:  Lisa [name] is a 62 y.o. female with medical history significant for adjustment disorder, hypertension, history of CVA, left-sided spastic hemiplegia, aphasia, ESBL E. coli bacteremia earlier this year, and recent admission with GI bleeding, now presenting to the emergency department for evaluation of low blood pressure.  Patient is unable to contribute to the history due to her clinical condition.  She was reportedly noted to have a low blood pressure and fever at her nursing facility and directed to the ED for further evaluation of this.   Assessment / Plan / Recommendation Clinical Impression  Clinical swallow  evaluation completed at bedside. Pt repositioned as upright as possible, however her head is turned sharply to the right. Pt with thick mucous with some blood tinging in nose and obvious congested cough. Oral care completed and Pt with mild excess oral secretions. Pt initially without overt signs of reduced airway protection, however as trials progressed, Pt with intermittent coughing. It is difficult to determine if coughing is related to po intake, however Pt is certainly at risk for aspiration given previous CVA, altered mental status, left hemiparesis, and reduced trunk/neck support with lean to right at this time.   SLP spoke with Pt's sister, [name] on the phone who provided further information on Pt's medical history. The Pt has been at the [facility] in [place] since her stroke ~2 years ago and did "really well" up until this past spring and has experienced a steady decline since then due to infections. Pt typically consumes a regular diet with thin liquids and only presented with her head leaning to the right about a month ago. Will proceed with MBSS later today to objectively evaluate swallow and assist with diet recommendations. OK to continue diet as ordered for now and present PO meds crushed as able in puree.  SLP Visit Diagnosis: Dysphagia, oral phase (R13.11)    Aspiration Risk  Mild aspiration risk    Diet Recommendation     Medication Administration: Crushed with puree Supervision: Staff to assist with self feeding;Full supervision/cueing for compensatory strategies Compensations: Slow rate Postural Changes: Seated upright at 90 degrees;Remain upright for at least 30  minutes after po intake    Other  Recommendations Oral Care Recommendations: Oral care BID;Staff/trained caregiver to provide oral care   Follow up Recommendations Skilled Nursing facility      Frequency and Duration            Prognosis Prognosis for Safe Diet Advancement: Fair Barriers to Reach Goals:  Cognitive deficits      Swallow Study   General Date of Onset: 05/03/18 HPI: Lisa [name] is a 62 y.o. female with medical history significant for adjustment disorder, hypertension, history of CVA, left-sided spastic hemiplegia, aphasia, ESBL E. coli bacteremia earlier this year, and recent admission with GI bleeding, now presenting to the emergency department for evaluation of low blood pressure.  Patient is unable to contribute to the history due to her clinical condition.  She was reportedly noted to have a low blood pressure and fever at her nursing facility and directed to the ED for further evaluation of this. Type of Study: Bedside Swallow Evaluation Diet Prior to this Study: Dysphagia 1 (puree);Nectar-thick liquids Temperature Spikes Noted: Yes Respiratory Status: Room air History of Recent Intubation: No Behavior/Cognition: Alert;Cooperative;Pleasant mood;Requires cueing Oral Cavity Assessment: Excessive secretions Oral Care Completed by SLP: Yes Oral Cavity - Dentition: Adequate natural dentition Vision: Impaired for self-feeding Self-Feeding Abilities: Needs assist Patient Positioning: Upright in bed;Postural control interferes with function(head turned sharply right) Baseline Vocal Quality: Normal Volitional Cough: Strong;Congested Volitional Swallow: Able to elicit    Oral/Motor/Sensory Function Overall Oral Motor/Sensory Function: Mild impairment   Ice Chips Ice chips: Within functional limits Presentation: Spoon   Thin Liquid Thin Liquid: Within functional limits Presentation: Cup;Self Fed;Straw    Nectar Thick Nectar Thick Liquid: Not tested   Honey Thick Honey Thick Liquid: Not tested   Puree Puree: Impaired Presentation: Self Fed Oral Phase Impairments: Reduced labial seal;Reduced lingual movement/coordination Oral Phase Functional Implications: Prolonged oral transit Pharyngeal Phase Impairments: Cough - Delayed   Solid     Solid: Impaired Presentation:  Spoon Oral Phase Impairments: Reduced labial seal Oral Phase Functional Implications: Prolonged oral transit Pharyngeal Phase Impairments: Cough - Delayed     Thank you,  Havery Moros, CCC-SLP [phone]  Dhamar Gregory 05/06/2018,10:55 AM

## 2018-05-06 NOTE — Progress Notes (Signed)
SLP Cancellation Note  Patient Details Name: Renuka Farfan MRN: [id number] DOB: 30-Oct-1955   Cancelled treatment:       Reason Eval/Treat Not Completed: Patient's level of consciousness; Pt with a change in mental status and head CT completed. Unable to complete MBSS this date. SLP will check on Pt tomorrow AM to determine appropriateness for MBSS and po intake.   Thank you,  Havery Moros, CCC-SLP [phone]    Ilianna Bown 05/06/2018, 3:25 PM

## 2018-05-06 NOTE — Progress Notes (Signed)
Notified Dr. Kerry Hough of ABG and CT head results. Pt alert to self, time and place on return from CT this afternoon. Nursing to continue to monitor and notify MD of any changes, decreased BP or increased lethargy. Earnstine Regal, RN

## 2018-05-06 NOTE — Progress Notes (Signed)
New orders for Keppra 1000 mg IV and depakote 1000 mg IV now per neurology recommendations. Notified Dr. Kerry Hough that the Keppra 500 mg IV dose was already infusing. Stated to continue that dose and have pharmacy add another dose of Keppra 500 mg IV for this evening. Discussed with Sue Lush, PharmD. Nursing to continue monitoring patient tonight. Earnstine Regal, RN

## 2018-05-07 ENCOUNTER — Inpatient Hospital Stay (HOSPITAL_COMMUNITY): Payer: Medicaid Other

## 2018-05-07 DIAGNOSIS — N3000 Acute cystitis without hematuria: Secondary | ICD-10-CM

## 2018-05-07 LAB — RENAL FUNCTION PANEL
ALBUMIN: 2.3 g/dL — AB (ref 3.5–5.0)
Anion gap: 9 (ref 5–15)
BUN: <5 mg/dL — ABNORMAL LOW (ref 8–23)
CALCIUM: 8.2 mg/dL — AB (ref 8.9–10.3)
CO2: 29 mmol/L (ref 22–32)
CREATININE: 0.49 mg/dL (ref 0.44–1.00)
Chloride: 100 mmol/L (ref 98–111)
Glucose, Bld: 113 mg/dL — ABNORMAL HIGH (ref 70–99)
PHOSPHORUS: 3.1 mg/dL (ref 2.5–4.6)
Potassium: 3 mmol/L — ABNORMAL LOW (ref 3.5–5.1)
SODIUM: 138 mmol/L (ref 135–145)

## 2018-05-07 LAB — CBC WITH DIFFERENTIAL/PLATELET
Abs Immature Granulocytes: 0.13 10*3/uL — ABNORMAL HIGH (ref 0.00–0.07)
BASOS PCT: 1 %
Basophils Absolute: 0 10*3/uL (ref 0.0–0.1)
EOS PCT: 2 %
Eosinophils Absolute: 0.1 10*3/uL (ref 0.0–0.5)
HCT: 32 % — ABNORMAL LOW (ref 36.0–46.0)
Hemoglobin: 9.8 g/dL — ABNORMAL LOW (ref 12.0–15.0)
Immature Granulocytes: 2 %
Lymphocytes Relative: 27 %
Lymphs Abs: 2.1 10*3/uL (ref 0.7–4.0)
MCH: 30.3 pg (ref 26.0–34.0)
MCHC: 30.6 g/dL (ref 30.0–36.0)
MCV: 99.1 fL (ref 80.0–100.0)
MONO ABS: 0.8 10*3/uL (ref 0.1–1.0)
Monocytes Relative: 10 %
Neutro Abs: 4.7 10*3/uL (ref 1.7–7.7)
Neutrophils Relative %: 58 %
Platelets: 120 10*3/uL — ABNORMAL LOW (ref 150–400)
RBC: 3.23 MIL/uL — ABNORMAL LOW (ref 3.87–5.11)
RDW: 16.4 % — ABNORMAL HIGH (ref 11.5–15.5)
WBC: 7.9 10*3/uL (ref 4.0–10.5)
nRBC: 0 % (ref 0.0–0.2)

## 2018-05-07 LAB — GLUCOSE, CAPILLARY
Glucose-Capillary: 85 mg/dL (ref 70–99)
Glucose-Capillary: 95 mg/dL (ref 70–99)
Glucose-Capillary: 112 mg/dL — ABNORMAL HIGH (ref 70–99)
Glucose-Capillary: 108 mg/dL — ABNORMAL HIGH (ref 70–99)
Glucose-Capillary: 111 mg/dL — ABNORMAL HIGH (ref 70–99)
Glucose-Capillary: 117 mg/dL — ABNORMAL HIGH (ref 70–99)

## 2018-05-07 LAB — MAGNESIUM: MAGNESIUM: 1.5 mg/dL — AB (ref 1.7–2.4)

## 2018-05-07 MED ORDER — CEFAZOLIN SODIUM-DEXTROSE 1-4 GM/50ML-% IV SOLN
1.0000 g | Freq: Three times a day (TID) | INTRAVENOUS | Status: DC
  Administered 2018-05-07 – 2018-05-09 (×5): 1 g via INTRAVENOUS
  Filled 2018-05-07 (×9): qty 50

## 2018-05-07 MED ORDER — MAGNESIUM SULFATE 4 GM/100ML IV SOLN
4.0000 g | Freq: Once | INTRAVENOUS | Status: AC
  Administered 2018-05-07: 4 g via INTRAVENOUS
  Filled 2018-05-07: qty 100

## 2018-05-07 MED ORDER — POTASSIUM CHLORIDE 10 MEQ/100ML IV SOLN
10.0000 meq | INTRAVENOUS | Status: AC
  Administered 2018-05-07 (×4): 10 meq via INTRAVENOUS
  Filled 2018-05-07 (×4): qty 100

## 2018-05-07 MED ORDER — VALPROATE SODIUM 500 MG/5ML IV SOLN
INTRAVENOUS | Status: AC
  Filled 2018-05-07: qty 10

## 2018-05-07 NOTE — Progress Notes (Addendum)
PROGRESS NOTE    Lisa [name]  ZOX:[id number]  DOB: 1955/09/16  DOA: 05/03/2018 PCP: Audree Bane, DO   Brief Admission Hx: Lisa [name] is a 62 y.o. female with medical history significant for adjustment disorder, hypertension, history of CVA, left-sided spastic hemiplegia, aphasia, ESBL E. coli bacteremia earlier this year, and recent admission with GI bleeding, now presenting to the emergency department for evaluation of low blood pressure.  Patient is unable to contribute to the history due to her clinical condition.  She was reportedly noted to have a low blood pressure and fever at her nursing facility and directed to the ED for further evaluation of this.  MDM/Assessment & Plan: 1. Acute encephalopathy.  Patient had intermittent periods of lethargy through the day.  This was followed by periods of her returning to her baseline mental status.  After discussing with tele-neurologist, they seem more consistent with a seizure etiology.  Her antibiotics were changed to IV form.  It is possible that recent UTI had decreased her seizure threshold.  Depakote level was checked and noted to be too low to detect.  CT head was performed that showed remote stroke in the right frontal lobe with underlying chronic ischemic microvascular white matter disease.  There was also smaller focal chronic or late subacute infarct in the left frontal lobe.  Further assessment by MRI brain was considered, but since she has a history of aneurysm with clips, she could not be cleared to undergo MRI.  After discussing with neurologist on 11/4,  she was not felt to be candidate for TPA due to her intermittent changes in mental status, atypical presentation and findings of possible subacute infarct on CT head.  ABG did not show any elevated PCO2 and pH was normal.  She had a glucose in the 70s which has been stable.  Zanaflex has been discontinued.   2. sepsis secondary to UTI - Improved. History of ESBL UTI.  Based  on cultures, meropenem was initially changed to ciprofloxacin, but since Cipro can be associated with decreasing seizure threshold, this is been changed to cefazolin.  3. History of CVA with vascular dementia - continue aspirin for secondary prevention.  4. Left spastic hemiplegia / muscle spasticity -Zanaflex discontinued due to mental status changes.  5. Essential hypertension -chronically on amlodipine, this has been discontinued due to low blood pressures 6. Hypotension -patient had another episode of hypotension today.  This improved with fluid bolus.  Amlodipine discontinued.  Continue to monitor. 7. Epilepsy -Keppra and Depakote changed to IV.  EEG has been ordered. 8. Depression - continue zoloft and remeron.  9. Hypernatremia -resolved with hypotonic fluids 10. Low phosphorus - K-phos ordered.   11. Hypomagnesemia -replace 12. Hypokalemia -replace  DVT prophylaxis: lovenox  Code Status: DNR  Family Communication: No family present, sister who is power of attorney was updated over the phone Disposition Plan:  Inpatient for IV antibiotics  Antimicrobials:  Meropenem 11/1 > 11/4  Ciprofloxacin 11/4 > 11/5  Cefazolin 11/5 >  Subjective: Patient is more awake today.  Interactive and answering questions.  Denies any pain.  Objective: Vitals:   05/06/18 1625 05/06/18 2211 05/07/18 0549 05/07/18 1502  BP: 115/70 134/83 132/86 (!) 158/91  Pulse: 69 87 75 (!) 104  Resp: 16 14  18   Temp: 98.5 F (36.9 C) 98.5 F (36.9 C) 98.5 F (36.9 C) 98.1 F (36.7 C)  TempSrc: Oral Oral Oral   SpO2: 99% 100% 98% 100%  Weight:  Intake/Output Summary (Last 24 hours) at 05/07/2018 1809 Last data filed at 05/07/2018 1707 Gross per 24 hour  Intake 2100.8 ml  Output 1100 ml  Net 1000.8 ml   Filed Weights   05/04/18 0222  Weight: 53.9 kg   Exam:  General exam: Alert, awake, no distress Respiratory system: Clear to auscultation. Respiratory effort normal. Cardiovascular  system:RRR. No murmurs, rubs, gallops. Gastrointestinal system: Abdomen is nondistended, soft and nontender. No organomegaly or masses felt. Normal bowel sounds heard. Central nervous system: Chronic left-sided hemiparesis Extremities: No C/C/E, +pedal pulses Skin: No rashes, lesions or ulcers Psychiatry: Cooperative, answering questions, more awake today    Data Reviewed: Basic Metabolic Panel: Recent Labs  Lab 05/03/18 1954 05/04/18 0507 05/04/18 0554 05/05/18 0624 05/06/18 0529 05/07/18 0502  NA 145 146*  --  146* 143 138  K 3.9 3.1*  --  3.5 3.7 3.0*  CL 101 109  --  110 104 100  CO2 34* 32  --  29 32 29  GLUCOSE 83 103*  --  134* 83 113*  BUN 16 13  --  <5* <5* <5*  CREATININE 0.69 0.59  --  0.42* 0.40* 0.49  CALCIUM 8.7* 7.5*  --  8.1* 8.3* 8.2*  MG  --   --  2.0 1.5* 1.8 1.5*  PHOS  --   --   --  1.6* 2.4* 3.1   Liver Function Tests: Recent Labs  Lab 05/03/18 1954 05/05/18 0624 05/06/18 0529 05/07/18 0502  AST 29  --   --   --   ALT 16  --   --   --   ALKPHOS 63  --   --   --   BILITOT 0.9  --   --   --   PROT 7.4  --   --   --   ALBUMIN 2.6* 2.2* 2.1* 2.3*   No results for input(s): LIPASE, AMYLASE in the last 168 hours. Recent Labs  Lab 05/06/18 1955  AMMONIA 24   CBC: Recent Labs  Lab 05/03/18 1954 05/04/18 0507 05/05/18 0624 05/06/18 0529 05/07/18 0502  WBC 7.3 7.2 6.6 6.9 7.9  NEUTROABS 5.0  --  4.1 3.9 4.7  HGB 11.1* 8.1* 9.2* 9.7* 9.8*  HCT 38.0 28.0* 32.1* 32.0* 32.0*  MCV 102.7* 103.7* 103.5* 101.9* 99.1  PLT 133* 81* 93* 106* 120*   Cardiac Enzymes: No results for input(s): CKTOTAL, CKMB, CKMBINDEX, TROPONINI in the last 168 hours. CBG (last 3)  Recent Labs    05/07/18 0734 05/07/18 1131 05/07/18 1630  GLUCAP 112* 108* 111*   Recent Results (from the past 240 hour(s))  Blood Culture (routine x 2)     Status: None (Preliminary result)   Collection Time: 05/03/18  7:54 PM  Result Value Ref Range Status   Specimen  Description BLOOD RIGHT ARM  Final   Special Requests   Final    BOTTLES DRAWN AEROBIC AND ANAEROBIC Blood Culture adequate volume   Culture   Final    NO GROWTH 4 DAYS Performed at [facility], [address], [place], [place] [address]    Report Status PENDING  Incomplete  Blood Culture (routine x 2)     Status: None (Preliminary result)   Collection Time: 05/03/18  8:07 PM  Result Value Ref Range Status   Specimen Description BLOOD LEFT ARM  Final   Special Requests   Final    BOTTLES DRAWN AEROBIC AND ANAEROBIC Blood Culture adequate volume   Culture  Final    NO GROWTH 4 DAYS Performed at [facility], [address], [place], [place] [address]    Report Status PENDING  Incomplete  Urine culture     Status: Abnormal   Collection Time: 05/03/18  8:51 PM  Result Value Ref Range Status   Specimen Description   Final    URINE, CLEAN CATCH Performed at [facility], [address] [place], [place] [address]    Special Requests   Final    NONE Performed at [facility], [address], [place], [place] [address]    Culture >=100,000 COLONIES/mL STAPHYLOCOCCUS SIMULANS (A)  Final   Report Status 05/06/2018 FINAL  Final   Organism ID, Bacteria STAPHYLOCOCCUS SIMULANS (A)  Final      Susceptibility   Staphylococcus simulans - MIC*    CIPROFLOXACIN <=0.5 SENSITIVE Sensitive     GENTAMICIN <=0.5 SENSITIVE Sensitive     NITROFURANTOIN <=16 SENSITIVE Sensitive     OXACILLIN <=0.25 SENSITIVE Sensitive     TETRACYCLINE <=1 SENSITIVE Sensitive     VANCOMYCIN <=0.5 SENSITIVE Sensitive     TRIMETH/SULFA <=10 SENSITIVE Sensitive     CLINDAMYCIN RESISTANT Resistant     RIFAMPIN <=0.5 SENSITIVE Sensitive     Inducible Clindamycin POSITIVE Resistant     * >=100,000 COLONIES/mL STAPHYLOCOCCUS SIMULANS  MRSA PCR Screening     Status: None   Collection Time: 05/04/18 12:46 AM  Result Value Ref Range Status   MRSA by PCR NEGATIVE NEGATIVE Final    Comment:        The GeneXpert MRSA Assay  (FDA approved for NASAL specimens only), is one component of a comprehensive MRSA colonization surveillance program. It is not intended to diagnose MRSA infection nor to guide or monitor treatment for MRSA infections. Performed at [facility], [address], [place], [place] [address]      Studies: Ct Head Wo Contrast  Result Date: 05/06/2018 CLINICAL DATA:  Altered mental status. Altered level of consciousness. EXAM: CT HEAD WITHOUT CONTRAST TECHNIQUE: Contiguous axial images were obtained from the base of the skull through the vertex without intravenous contrast. COMPARISON:  None. FINDINGS: Brain: Extensive streak artifact from metal density along the right sellar region., probably related to prior cerebral aneurysm repair. The cerebellum appears normal. Remote encephalomalacia in the right frontal lobe with some evidence of Wallerian degeneration and reduced size of the right cerebral peduncle resulting. Thin corpus callosum and suspected remote infarct involving the head of the right caudate nucleus. Periventricular white matter and corona radiata hypodensities favor chronic ischemic microvascular white matter disease. Mildly confluent white matter hypodensity and focal loss of gray-white junction in the left frontal lobe on image 24/4 and image 25/2. This could be a chronic or subacute left frontal lobe infarct. Upper normal size of the ventricular systems and temporal horns. No intracranial hemorrhage or mass lesion is identified. Vascular: Streak artifact obscures the middle cerebral arteries. There is atherosclerotic calcification of the cavernous carotid arteries bilaterally. Skull: Suspected remote left frontal burr hole on image 52/3. No acute calvarial findings. Sinuses/Orbits: Acute on chronic right maxillary sinusitis with an air-fluid level. Chronic ethmoid, sphenoid, left frontal, and left maxillary sinusitis. Other: No supplemental non-categorized findings. IMPRESSION: 1. Remote  stroke in the right frontal lobe with underlying chronic ischemic microvascular white matter disease. 2. Smaller focal chronic or late subacute infarct in the left frontal lobe. MRI could be utilized to assess chronicity or for occult acute stroke if clinically warranted. 3. Streak artifact from metal  density along the right sellar region likely related to prior cerebral aneurysm repair. 4. Acute on chronic right maxillary sinusitis with air-level. Other chronic paranasal sinusitis is noted. Electronically Signed   By: Gaylyn Rong M.D.   On: 05/06/2018 14:15   Dg Swallowing Func-speech Pathology  Result Date: 05/07/2018 Objective Swallowing Evaluation: Type of Study: MBS-Modified Barium Swallow Study  Patient Details Name: Kattleya Kuhnert MRN: [id number] Date of Birth: Mar 03, 1956 Today's Date: 05/07/2018 Time: SLP Start Time (ACUTE ONLY): 1200 -SLP Stop Time (ACUTE ONLY): 1225 SLP Time Calculation (min) (ACUTE ONLY): 25 min Past Medical History: Past Medical History: Diagnosis Date . Abnormal posture  . Adjustment disorder  . Anemia, unspecified  . Aphasia-angular gyrus syndrome  . Arthritis, climacteric, knee, left  . Atypical psychosis (HCC)  . C. difficile diarrhea  . Cerebral arteritis  . Contracture of left ankle  . Essential hypertension, malignant  . Essential hypertension, malignant  . Hyperlipemia  . Left spastic hemiplegia (HCC)  . Muscle weakness (generalized)  . 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 Oropharyngeal dysphagia  . Other sequelae following nontraumatic subarachnoid hemorrhage  . Primary osteoarthritis of both knees  . Screening for thyroid disorder  . Screening for thyroid disorder  . Seizures (HCC)  . Sequelae of cerebral infarction  . Unsteady  . Vascular dementia with delirium [facility])  Past Surgical History: Past Surgical History: Procedure Laterality Date . CEREBRAL ANEURYSM REPAIR    2017? then suffered stroke HPI: Lisa [name] is a 62 y.o.  female with medical history significant for adjustment disorder, hypertension, history of CVA, left-sided spastic hemiplegia, aphasia, ESBL E. coli bacteremia earlier this year, and recent admission with GI bleeding, now presenting to the emergency department for evaluation of low blood pressure.  Patient is unable to contribute to the history due to her clinical condition.  She was reportedly noted to have a low blood pressure and fever at her nursing facility and directed to the ED for further evaluation of this.  Subjective: "I wish I had my shoes." Assessment / Plan / Recommendation CHL IP CLINICAL IMPRESSIONS 05/07/2018 Clinical Impression Pt presents with mild oropharyngeal phase dysphagia characterized by delayed oral transit with solids and min premature spillage with delay in swallow initiation with liquids with swallow trigger at the level of the valleculae (teaspoon) and pyriforms (cup/straw). No penetration, aspiration, or significant residuals exhibited throughout the study despite sub optimal head positioning (head leaning right). Recommend D3/mech soft due to head positioning and occasional labial spillage and thin liquids via cup/straw and po meds whole with water. Pt will need feeder assist and only feed Pt when alert and upright given intermittent lethargy. SLP will sign off. Above to MD and RN.  SLP Visit Diagnosis Dysphagia, oropharyngeal phase (R13.12) Attention and concentration deficit following -- Frontal lobe and executive function deficit following -- Impact on safety and function Mild aspiration risk   CHL IP TREATMENT RECOMMENDATION 05/07/2018 Treatment Recommendations No treatment recommended at this time   Prognosis 05/07/2018 Prognosis for Safe Diet Advancement Good Barriers to Reach Goals Cognitive deficits Barriers/Prognosis Comment -- CHL IP DIET RECOMMENDATION 05/07/2018 SLP Diet Recommendations Dysphagia 3 (Mech soft) solids;Thin liquid Liquid Administration via Cup;Straw Medication  Administration Whole meds with liquid Compensations Slow rate Postural Changes Remain semi-upright after after feeds/meals (Comment);Seated upright at 90 degrees   CHL IP OTHER RECOMMENDATIONS 05/07/2018 Recommended Consults -- Oral Care Recommendations Oral care BID;Staff/trained caregiver to provide oral care Other Recommendations Clarify dietary restrictions   CHL  IP FOLLOW UP RECOMMENDATIONS 05/07/2018 Follow up Recommendations Skilled Nursing facility   No flowsheet data found.     CHL IP ORAL PHASE 05/07/2018 Oral Phase Impaired Oral - Pudding Teaspoon -- Oral - Pudding Cup -- Oral - Honey Teaspoon -- Oral - Honey Cup -- Oral - Nectar Teaspoon -- Oral - Nectar Cup -- Oral - Nectar Straw -- Oral - Thin Teaspoon -- Oral - Thin Cup -- Oral - Thin Straw -- Oral - Puree Right anterior bolus loss Oral - Mech Soft -- Oral - Regular -- Oral - Multi-Consistency -- Oral - Pill -- Oral Phase - Comment --  CHL IP PHARYNGEAL PHASE 05/07/2018 Pharyngeal Phase Impaired Pharyngeal- Pudding Teaspoon -- Pharyngeal -- Pharyngeal- Pudding Cup -- Pharyngeal -- Pharyngeal- Honey Teaspoon -- Pharyngeal -- Pharyngeal- Honey Cup -- Pharyngeal -- Pharyngeal- Nectar Teaspoon -- Pharyngeal -- Pharyngeal- Nectar Cup -- Pharyngeal -- Pharyngeal- Nectar Straw -- Pharyngeal -- Pharyngeal- Thin Teaspoon Delayed swallow initiation-pyriform sinuses Pharyngeal -- Pharyngeal- Thin Cup Delayed swallow initiation-pyriform sinuses Pharyngeal -- Pharyngeal- Thin Straw Delayed swallow initiation-pyriform sinuses Pharyngeal -- Pharyngeal- Puree Delayed swallow initiation-vallecula;Pharyngeal residue - valleculae Pharyngeal -- Pharyngeal- Mechanical Soft -- Pharyngeal -- Pharyngeal- Regular WFL;Delayed swallow initiation-vallecula Pharyngeal -- Pharyngeal- Multi-consistency -- Pharyngeal -- Pharyngeal- Pill WFL Pharyngeal -- Pharyngeal Comment --  CHL IP CERVICAL ESOPHAGEAL PHASE 05/07/2018 Cervical Esophageal Phase WFL Pudding Teaspoon -- Pudding Cup --  Honey Teaspoon -- Honey Cup -- Nectar Teaspoon -- Nectar Cup -- Nectar Straw -- Thin Teaspoon -- Thin Cup -- Thin Straw -- Puree -- Mechanical Soft -- Regular -- Multi-consistency -- Pill -- Cervical Esophageal Comment -- Thank you, Havery Moros, CCC-SLP [phone] PORTER,DABNEY 05/07/2018, 5:11 PM              Scheduled Meds: . aspirin  81 mg Oral Daily  . enoxaparin (LOVENOX) injection  40 mg Subcutaneous Q24H  . famotidine  20 mg Oral Daily  . mouth rinse  15 mL Mouth Rinse BID  . mirtazapine  15 mg Oral QHS  . polyethylene glycol  17 g Oral BID  . sertraline  50 mg Oral Daily  . sodium chloride flush  3 mL Intravenous Q12H   Continuous Infusions: .  ceFAZolin (ANCEF) IV    . dextrose 5 % and 0.9% NaCl Stopped (05/07/18 1707)  . levETIRAcetam 500 mg (05/07/18 1707)  . valproate sodium Stopped (05/07/18 1409)   Principal Problem:   Sepsis secondary to UTI [facility]) Active Problems:   Hypertension   Left spastic hemiplegia (HCC)   Seizure disorder (HCC)   History of completed stroke   History of ESBL E. coli infection   Acute encephalopathy  Time spent:  Erick Blinks, MD [facility] Pager [phone]  If 7PM-7AM, please contact night-coverage [email or web address] Password [email or web address] 05/07/2018, 6:09 PM    LOS: 4 days

## 2018-05-07 NOTE — Progress Notes (Signed)
Drove from [facility] to attempt EEG. Pt unavailable due to pt going down for swallow procedure, when asked if they could hold off on the swallow eval for EEG, speech therapist said she had no other availability today therefore EEG could not get done. Will attempt tomorrow as schedule permits.

## 2018-05-07 NOTE — Progress Notes (Signed)
Pharmacy Antibiotic Note  Lisa [name] is a 62 y.o. female admitted on 05/03/2018 with UTI.  Pharmacy has been consulted for cefazolin dosing.  Plan: Cefazolin 1000 mg IV every 8 hours.  Monitor labs, c/s, and patient improvement.  Weight: 118 lb 13.3 oz (53.9 kg)  Temp (24hrs), Avg:98.4 F (36.9 C), Min:98.1 F (36.7 C), Max:98.5 F (36.9 C)  Recent Labs  Lab 05/03/18 1954 05/03/18 2026 05/04/18 0219 05/04/18 0507 05/05/18 0624 05/06/18 0529 05/07/18 0502  WBC 7.3  --   --  7.2 6.6 6.9 7.9  CREATININE 0.69  --   --  0.59 0.42* 0.40* 0.49  LATICACIDVEN  --  2.84* 1.5 1.0  --   --   --     Estimated Creatinine Clearance: 62 mL/min (by C-G formula based on SCr of 0.49 mg/dL).    No Known Allergies  Antimicrobials this admission: Cefazolin 11/5 >>  Cipro 11/4 >> 11/5  Dose adjustments this admission: N/A  Microbiology results: 11/1 BCx: ngtd 11/1 UCx: staph simulans  11/2 MRSA PCR: negative  Thank you for allowing pharmacy to be a part of this patient's care.  Tad Moore 05/07/2018 3:42 PM

## 2018-05-07 NOTE — Progress Notes (Signed)
Modified Barium Swallow Progress Note  Patient Details  Name: Lisa [name] MRN: [id number] Date of Birth: 02/07/1956  Today's Date: 05/07/2018  Modified Barium Swallow completed.  Full report located under Chart Review in the Imaging Section.  Brief recommendations include the following:  Clinical Impression  Pt presents with mild oropharyngeal phase dysphagia characterized by delayed oral transit with solids and min premature spillage with delay in swallow initiation with liquids with swallow trigger at the level of the valleculae (teaspoon) and pyriforms (cup/straw). No penetration, aspiration, or significant residuals exhibited throughout the study despite sub optimal head positioning (head leaning right). Recommend D3/mech soft due to head positioning and occasional labial spillage and thin liquids via cup/straw and po meds whole with water. Pt will need feeder assist and only feed Pt when alert and upright given intermittent lethargy. SLP will sign off. Above to MD and RN.    Swallow Evaluation Recommendations       SLP Diet Recommendations: Dysphagia 3 (Mech soft) solids;Thin liquid   Liquid Administration via: Cup;Straw   Medication Administration: Whole meds with liquid   Supervision: Full assist for feeding;Staff to assist with self feeding;Full supervision/cueing for compensatory strategies   Compensations: Slow rate   Postural Changes: Remain semi-upright after after feeds/meals (Comment);Seated upright at 90 degrees   Oral Care Recommendations: Oral care BID;Staff/trained caregiver to provide oral care   Other Recommendations: Clarify dietary restrictions  Thank you,  Havery Moros, CCC-SLP [phone]   , 05/07/2018,12:58 PM

## 2018-05-08 ENCOUNTER — Inpatient Hospital Stay (HOSPITAL_COMMUNITY)
Admit: 2018-05-08 | Discharge: 2018-05-08 | Disposition: A | Discharge status: Home / Self Care | Payer: Medicaid Other | Attending: Internal Medicine | Admitting: Internal Medicine

## 2018-05-08 DIAGNOSIS — G9340 Encephalopathy, unspecified: Secondary | ICD-10-CM

## 2018-05-08 LAB — CULTURE, BLOOD (ROUTINE X 2)
CULTURE: NO GROWTH
CULTURE: NO GROWTH
Special Requests: ADEQUATE
Special Requests: ADEQUATE

## 2018-05-08 LAB — GLUCOSE, CAPILLARY
GLUCOSE-CAPILLARY: 103 mg/dL — AB (ref 70–99)
GLUCOSE-CAPILLARY: 72 mg/dL (ref 70–99)
GLUCOSE-CAPILLARY: 92 mg/dL (ref 70–99)
GLUCOSE-CAPILLARY: 99 mg/dL (ref 70–99)
Glucose-Capillary: 96 mg/dL (ref 70–99)
Glucose-Capillary: 86 mg/dL (ref 70–99)

## 2018-05-08 LAB — CBC WITH DIFFERENTIAL/PLATELET
ABS IMMATURE GRANULOCYTES: 0.12 10*3/uL — AB (ref 0.00–0.07)
Basophils Absolute: 0.1 10*3/uL (ref 0.0–0.1)
Basophils Relative: 1 %
EOS PCT: 3 %
Eosinophils Absolute: 0.3 10*3/uL (ref 0.0–0.5)
HEMATOCRIT: 31.7 % — AB (ref 36.0–46.0)
HEMOGLOBIN: 9.6 g/dL — AB (ref 12.0–15.0)
Immature Granulocytes: 1 %
LYMPHS PCT: 32 %
Lymphs Abs: 3 10*3/uL (ref 0.7–4.0)
MCH: 30.7 pg (ref 26.0–34.0)
MCHC: 30.3 g/dL (ref 30.0–36.0)
MCV: 101.3 fL — AB (ref 80.0–100.0)
MONO ABS: 0.9 10*3/uL (ref 0.1–1.0)
MONOS PCT: 10 %
NEUTROS ABS: 4.9 10*3/uL (ref 1.7–7.7)
Neutrophils Relative %: 53 %
Platelets: 154 10*3/uL (ref 150–400)
RBC: 3.13 MIL/uL — AB (ref 3.87–5.11)
RDW: 16.8 % — ABNORMAL HIGH (ref 11.5–15.5)
WBC: 9.2 10*3/uL (ref 4.0–10.5)
nRBC: 0 % (ref 0.0–0.2)

## 2018-05-08 LAB — RENAL FUNCTION PANEL
Albumin: 2.1 g/dL — ABNORMAL LOW (ref 3.5–5.0)
Anion gap: 8 (ref 5–15)
CHLORIDE: 104 mmol/L (ref 98–111)
CO2: 29 mmol/L (ref 22–32)
CREATININE: 0.37 mg/dL — AB (ref 0.44–1.00)
Calcium: 8 mg/dL — ABNORMAL LOW (ref 8.9–10.3)
GFR calc Af Amer: >60 mL/min (ref >60)
GLUCOSE: 94 mg/dL (ref 70–99)
POTASSIUM: 3.2 mmol/L — AB (ref 3.5–5.1)
Phosphorus: 2.7 mg/dL (ref 2.5–4.6)
Sodium: 141 mmol/L (ref 135–145)

## 2018-05-08 LAB — MAGNESIUM: MAGNESIUM: 2.6 mg/dL — AB (ref 1.7–2.4)

## 2018-05-08 MED ORDER — POTASSIUM CHLORIDE 10 MEQ/100ML IV SOLN
10.0000 meq | INTRAVENOUS | Status: AC
  Administered 2018-05-08 (×4): 10 meq via INTRAVENOUS
  Filled 2018-05-08 (×4): qty 100

## 2018-05-08 MED ORDER — LEVETIRACETAM 500 MG PO TABS
500.0000 mg | ORAL_TABLET | Freq: Two times a day (BID) | ORAL | Status: DC
  Administered 2018-05-08 – 2018-05-09 (×2): 500 mg via ORAL
  Filled 2018-05-08 (×2): qty 1

## 2018-05-08 MED ORDER — DIVALPROEX SODIUM 250 MG PO DR TAB
750.0000 mg | DELAYED_RELEASE_TABLET | Freq: Two times a day (BID) | ORAL | Status: DC
  Administered 2018-05-08 – 2018-05-09 (×2): 750 mg via ORAL
  Filled 2018-05-08 (×2): qty 3

## 2018-05-08 NOTE — Progress Notes (Signed)
PROGRESS NOTE    Lisa [name]  ZOX:[id number]  DOB: [birth year]  DOA: 05/03/2018 PCP: Audree Bane, DO   Brief Admission Hx: Lisa [name] is a 62 y.o. female with medical history significant for adjustment disorder, hypertension, history of CVA, left-sided spastic hemiplegia, aphasia, ESBL E. coli bacteremia earlier this year, and recent admission with GI bleeding, now presenting to the emergency department for evaluation of low blood pressure.  Patient is unable to contribute to the history due to her clinical condition.  She was reportedly noted to have a low blood pressure and fever at her nursing facility and directed to the ED for further evaluation of this.  MDM/Assessment & Plan: 1. Acute encephalopathy.  Patient had intermittent periods of lethargy through the day.  This was followed by periods of her returning to her baseline mental status.  After discussing with tele-neurologist, they seem more consistent with a seizure etiology.  Her antibiotics were changed to IV form.  It is possible that recent UTI had decreased her seizure threshold.  Depakote level was checked and noted to be too low to detect.  CT head was performed that showed remote stroke in the right frontal lobe with underlying chronic ischemic microvascular white matter disease.  There was also smaller focal chronic or late subacute infarct in the left frontal lobe.  Further assessment by MRI brain was considered, but since she has a history of aneurysm with clips, she could not be cleared to undergo MRI.  After discussing with neurologist on 11/4,  she was not felt to be candidate for TPA due to her intermittent changes in mental status, atypical presentation and findings of possible subacute infarct on CT head.  ABG did not show any elevated PCO2 and pH was normal.  She had a glucose in the 70s which has been stable.  Zanaflex has been discontinued.  Overall, mental status has significantly improved and appears to be  approaching baseline.  It is likely that seizure is the main etiology of her change in mental status. 2. sepsis secondary to UTI - Improved. History of ESBL UTI.  Based on cultures, meropenem was initially changed to ciprofloxacin, but since Cipro can be associated with decreasing seizure threshold, this is been changed to cefazolin.  Anticipate discontinuing antibiotics tomorrow since this is been adequately treated. 3. History of CVA with vascular dementia - continue aspirin for secondary prevention.  4. Left spastic hemiplegia / muscle spasticity -Zanaflex discontinued due to mental status changes.  5. Essential hypertension -chronically on amlodipine, this has been discontinued due to low blood pressures 6. Epilepsy -EEG does not show any epileptiform discharges.  Will change Keppra and Depakote back to oral dosing.  Review of prior valproic acid levels indicate that current dosing to maintain good therapeutic level.  Will restart Depakote oral dosing today and she will need repeat valproic acid level in 5 days. 7. Depression - continue zoloft and remeron.  8. Hypernatremia -resolved with hypotonic fluids 9. Low phosphorus - K-phos ordered.  Corrected. 10. Hypomagnesemia -replace 11. Hypokalemia -replace  DVT prophylaxis: lovenox  Code Status: DNR  Family Communication: Discussed with sister and brother-in-law at the bedside Disposition Plan:  Inpatient for IV antibiotics  Antimicrobials:  Meropenem 11/1 > 11/4  Ciprofloxacin 11/4 > 11/5  Cefazolin 11/5 >11/7  Subjective: She is sitting up in bed, eating applesauce.  She denies any specific complaints.  Objective: Vitals:   05/07/18 1502 05/07/18 2049 05/08/18 0417 05/08/18 1515  BP: (!) 158/91 116/73 (!) 153/92 Marland Kitchen)  140/93  Pulse: (!) 104 83 89 76  Resp: 18 16 18 16   Temp: 98.1 F (36.7 C) 98.1 F (36.7 C) 98.1 F (36.7 C)   TempSrc:  Oral Oral   SpO2: 100% 100% 100% 100%  Weight:        Intake/Output Summary (Last 24  hours) at 05/08/2018 1817 Last data filed at 05/08/2018 1500 Gross per 24 hour  Intake 2598.7 ml  Output 2400 ml  Net 198.7 ml   Filed Weights   05/04/18 0222  Weight: 53.9 kg   Exam:  General exam: Alert, awake, no distress Respiratory system: Clear to auscultation. Respiratory effort normal. Cardiovascular system:RRR. No murmurs, rubs, gallops. Gastrointestinal system: Abdomen is nondistended, soft and nontender. No organomegaly or masses felt. Normal bowel sounds heard. Central nervous system: Left-sided weakness (chronic) Extremities: No C/C/E, +pedal pulses Skin: No rashes, lesions or ulcers Psychiatry: pleasant, awake, cooperative.      Data Reviewed: Basic Metabolic Panel: Recent Labs  Lab 05/04/18 0507 05/04/18 0554 05/05/18 0624 05/06/18 0529 05/07/18 0502 05/08/18 0424  NA 146*  --  146* 143 138 141  K 3.1*  --  3.5 3.7 3.0* 3.2*  CL 109  --  110 104 100 104  CO2 32  --  29 32 29 29  GLUCOSE 103*  --  134* 83 113* 94  BUN 13  --  <5* <5* <5* <5*  CREATININE 0.59  --  0.42* 0.40* 0.49 0.37*  CALCIUM 7.5*  --  8.1* 8.3* 8.2* 8.0*  MG  --  2.0 1.5* 1.8 1.5* 2.6*  PHOS  --   --  1.6* 2.4* 3.1 2.7   Liver Function Tests: Recent Labs  Lab 05/03/18 1954 05/05/18 0624 05/06/18 0529 05/07/18 0502 05/08/18 0424  AST 29  --   --   --   --   ALT 16  --   --   --   --   ALKPHOS 63  --   --   --   --   BILITOT 0.9  --   --   --   --   PROT 7.4  --   --   --   --   ALBUMIN 2.6* 2.2* 2.1* 2.3* 2.1*   No results for input(s): LIPASE, AMYLASE in the last 168 hours. Recent Labs  Lab 05/06/18 1955  AMMONIA 24   CBC: Recent Labs  Lab 05/03/18 1954 05/04/18 0507 05/05/18 0624 05/06/18 0529 05/07/18 0502 05/08/18 0424  WBC 7.3 7.2 6.6 6.9 7.9 9.2  NEUTROABS 5.0  --  4.1 3.9 4.7 4.9  HGB 11.1* 8.1* 9.2* 9.7* 9.8* 9.6*  HCT 38.0 28.0* 32.1* 32.0* 32.0* 31.7*  MCV 102.7* 103.7* 103.5* 101.9* 99.1 101.3*  PLT 133* 81* 93* 106* 120* 154   Cardiac  Enzymes: No results for input(s): CKTOTAL, CKMB, CKMBINDEX, TROPONINI in the last 168 hours. CBG (last 3)  Recent Labs    05/08/18 0735 05/08/18 1124 05/08/18 1630  GLUCAP 86 99 92   Recent Results (from the past 240 hour(s))  Blood Culture (routine x 2)     Status: None   Collection Time: 05/03/18  7:54 PM  Result Value Ref Range Status   Specimen Description BLOOD RIGHT ARM  Final   Special Requests   Final    BOTTLES DRAWN AEROBIC AND ANAEROBIC Blood Culture adequate volume   Culture   Final    NO GROWTH 5 DAYS Performed at [facility], [address], [place], [place] [address]  Report Status 05/08/2018 FINAL  Final  Blood Culture (routine x 2)     Status: None   Collection Time: 05/03/18  8:07 PM  Result Value Ref Range Status   Specimen Description BLOOD LEFT ARM  Final   Special Requests   Final    BOTTLES DRAWN AEROBIC AND ANAEROBIC Blood Culture adequate volume   Culture   Final    NO GROWTH 5 DAYS Performed at [facility], [address] [place], [place] [address]    Report Status 05/08/2018 FINAL  Final  Urine culture     Status: Abnormal   Collection Time: 05/03/18  8:51 PM  Result Value Ref Range Status   Specimen Description   Final    URINE, CLEAN CATCH Performed at [facility], [address], [place], [place] [address]    Special Requests   Final    NONE Performed at [facility], [address] [place], [place] [address]    Culture >=100,000 COLONIES/mL STAPHYLOCOCCUS SIMULANS (A)  Final   Report Status 05/06/2018 FINAL  Final   Organism ID, Bacteria STAPHYLOCOCCUS SIMULANS (A)  Final      Susceptibility   Staphylococcus simulans - MIC*    CIPROFLOXACIN <=0.5 SENSITIVE Sensitive     GENTAMICIN <=0.5 SENSITIVE Sensitive     NITROFURANTOIN <=16 SENSITIVE Sensitive     OXACILLIN <=0.25 SENSITIVE Sensitive     TETRACYCLINE <=1 SENSITIVE Sensitive     VANCOMYCIN <=0.5 SENSITIVE Sensitive     TRIMETH/SULFA <=10 SENSITIVE Sensitive     CLINDAMYCIN  RESISTANT Resistant     RIFAMPIN <=0.5 SENSITIVE Sensitive     Inducible Clindamycin POSITIVE Resistant     * >=100,000 COLONIES/mL STAPHYLOCOCCUS SIMULANS  MRSA PCR Screening     Status: None   Collection Time: 05/04/18 12:46 AM  Result Value Ref Range Status   MRSA by PCR NEGATIVE NEGATIVE Final    Comment:        The GeneXpert MRSA Assay (FDA approved for NASAL specimens only), is one component of a comprehensive MRSA colonization surveillance program. It is not intended to diagnose MRSA infection nor to guide or monitor treatment for MRSA infections. Performed at [facility], [address], [place], [place] [address]      Studies: Dg Swallowing Func-speech Pathology  Result Date: 05/07/2018 Objective Swallowing Evaluation: Type of Study: MBS-Modified Barium Swallow Study  Patient Details Name: Lisa [name] MRN: [id number] Date of Birth: [birth year] Today's Date: 05/07/2018 Time: SLP Start Time (ACUTE ONLY): 1200 -SLP Stop Time (ACUTE ONLY): 1225 SLP Time Calculation (min) (ACUTE ONLY): 25 min Past Medical History: Past Medical History: Diagnosis Date . Abnormal posture  . Adjustment disorder  . Anemia, unspecified  . Aphasia-angular gyrus syndrome  . Arthritis, climacteric, knee, left  . Atypical psychosis (HCC)  . C. difficile diarrhea  . Cerebral arteritis  . Contracture of left ankle  . Essential hypertension, malignant  . Essential hypertension, malignant  . Hyperlipemia  . Left spastic hemiplegia (HCC)  . Muscle weakness (generalized)  . 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 Oropharyngeal dysphagia  . Other sequelae following nontraumatic subarachnoid hemorrhage  . Primary osteoarthritis of both knees  . Screening for thyroid disorder  . Screening for thyroid disorder  . Seizures (HCC)  . Sequelae of cerebral infarction  . Unsteady  . Vascular dementia with delirium [facility])  Past Surgical History: Past Surgical History: Procedure Laterality Date .  CEREBRAL ANEURYSM REPAIR    2017? then suffered stroke HPI:  Lisa [name] is a 62 y.o. female with medical history significant for adjustment disorder, hypertension, history of CVA, left-sided spastic hemiplegia, aphasia, ESBL E. coli bacteremia earlier this year, and recent admission with GI bleeding, now presenting to the emergency department for evaluation of low blood pressure.  Patient is unable to contribute to the history due to her clinical condition.  She was reportedly noted to have a low blood pressure and fever at her nursing facility and directed to the ED for further evaluation of this.  Subjective: "I wish I had my shoes." Assessment / Plan / Recommendation CHL IP CLINICAL IMPRESSIONS 05/07/2018 Clinical Impression Pt presents with mild oropharyngeal phase dysphagia characterized by delayed oral transit with solids and min premature spillage with delay in swallow initiation with liquids with swallow trigger at the level of the valleculae (teaspoon) and pyriforms (cup/straw). No penetration, aspiration, or significant residuals exhibited throughout the study despite sub optimal head positioning (head leaning right). Recommend D3/mech soft due to head positioning and occasional labial spillage and thin liquids via cup/straw and po meds whole with water. Pt will need feeder assist and only feed Pt when alert and upright given intermittent lethargy. SLP will sign off. Above to MD and RN.  SLP Visit Diagnosis Dysphagia, oropharyngeal phase (R13.12) Attention and concentration deficit following -- Frontal lobe and executive function deficit following -- Impact on safety and function Mild aspiration risk   CHL IP TREATMENT RECOMMENDATION 05/07/2018 Treatment Recommendations No treatment recommended at this time   Prognosis 05/07/2018 Prognosis for Safe Diet Advancement Good Barriers to Reach Goals Cognitive deficits Barriers/Prognosis Comment -- CHL IP DIET RECOMMENDATION 05/07/2018 SLP Diet Recommendations  Dysphagia 3 (Mech soft) solids;Thin liquid Liquid Administration via Cup;Straw Medication Administration Whole meds with liquid Compensations Slow rate Postural Changes Remain semi-upright after after feeds/meals (Comment);Seated upright at 90 degrees   CHL IP OTHER RECOMMENDATIONS 05/07/2018 Recommended Consults -- Oral Care Recommendations Oral care BID;Staff/trained caregiver to provide oral care Other Recommendations Clarify dietary restrictions   CHL IP FOLLOW UP RECOMMENDATIONS 05/07/2018 Follow up Recommendations Skilled Nursing facility   No flowsheet data found.     CHL IP ORAL PHASE 05/07/2018 Oral Phase Impaired Oral - Pudding Teaspoon -- Oral - Pudding Cup -- Oral - Honey Teaspoon -- Oral - Honey Cup -- Oral - Nectar Teaspoon -- Oral - Nectar Cup -- Oral - Nectar Straw -- Oral - Thin Teaspoon -- Oral - Thin Cup -- Oral - Thin Straw -- Oral - Puree Right anterior bolus loss Oral - Mech Soft -- Oral - Regular -- Oral - Multi-Consistency -- Oral - Pill -- Oral Phase - Comment --  CHL IP PHARYNGEAL PHASE 05/07/2018 Pharyngeal Phase Impaired Pharyngeal- Pudding Teaspoon -- Pharyngeal -- Pharyngeal- Pudding Cup -- Pharyngeal -- Pharyngeal- Honey Teaspoon -- Pharyngeal -- Pharyngeal- Honey Cup -- Pharyngeal -- Pharyngeal- Nectar Teaspoon -- Pharyngeal -- Pharyngeal- Nectar Cup -- Pharyngeal -- Pharyngeal- Nectar Straw -- Pharyngeal -- Pharyngeal- Thin Teaspoon Delayed swallow initiation-pyriform sinuses Pharyngeal -- Pharyngeal- Thin Cup Delayed swallow initiation-pyriform sinuses Pharyngeal -- Pharyngeal- Thin Straw Delayed swallow initiation-pyriform sinuses Pharyngeal -- Pharyngeal- Puree Delayed swallow initiation-vallecula;Pharyngeal residue - valleculae Pharyngeal -- Pharyngeal- Mechanical Soft -- Pharyngeal -- Pharyngeal- Regular WFL;Delayed swallow initiation-vallecula Pharyngeal -- Pharyngeal- Multi-consistency -- Pharyngeal -- Pharyngeal- Pill WFL Pharyngeal -- Pharyngeal Comment --  CHL IP CERVICAL  ESOPHAGEAL PHASE 05/07/2018 Cervical Esophageal Phase WFL Pudding Teaspoon -- Pudding Cup -- Honey Teaspoon -- Honey Cup -- Nectar Teaspoon -- Nectar Cup -- Nectar Straw -- Thin  Teaspoon -- Thin Cup -- Thin Straw -- Puree -- Mechanical Soft -- Regular -- Multi-consistency -- Pill -- Cervical Esophageal Comment -- Thank you, Havery Moros, CCC-SLP [phone] PORTER,DABNEY 05/07/2018, 5:11 PM              Scheduled Meds: . aspirin  81 mg Oral Daily  . divalproex  750 mg Oral Q12H  . enoxaparin (LOVENOX) injection  40 mg Subcutaneous Q24H  . famotidine  20 mg Oral Daily  . levETIRAcetam  500 mg Oral BID  . mouth rinse  15 mL Mouth Rinse BID  . mirtazapine  15 mg Oral QHS  . polyethylene glycol  17 g Oral BID  . sertraline  50 mg Oral Daily  . sodium chloride flush  3 mL Intravenous Q12H   Continuous Infusions: .  ceFAZolin (ANCEF) IV 1 g (05/08/18 1448)   Principal Problem:   Sepsis secondary to UTI [facility]) Active Problems:   Hypertension   Left spastic hemiplegia (HCC)   Seizure disorder (HCC)   History of completed stroke   History of ESBL E. coli infection   Acute encephalopathy   Acute cystitis without hematuria  Time spent:  Erick Blinks, MD [facility] Pager [phone]  If 7PM-7AM, please contact night-coverage [email or web address] Password [email or web address] 05/08/2018, 6:17 PM    LOS: 5 days

## 2018-05-08 NOTE — Clinical Social Work Note (Signed)
Patient is a long term care resident at [facility]. It is the plan for patient to return at discharge.   Zamariah Seaborn, Juleen China, LCSW

## 2018-05-08 NOTE — Progress Notes (Signed)
Patient resting in bed. Patient voices no complaints at this time. Patient did eat a moderate amount of lunch and was cleaned after a large BM. Patient was repositioned and states she is comfortable. Patient denies any needs at this time. Will continue to monitor.

## 2018-05-08 NOTE — Progress Notes (Signed)
EEG completed; results pending.    

## 2018-05-08 NOTE — NC FL2 (Signed)
[place] MEDICAID FL2 LEVEL OF CARE SCREENING TOOL     IDENTIFICATION  Patient Name: Lisa [name] Birthdate: 02-Feb-1956 Sex: female Admission Date (Current Location): 05/03/2018  [place] and [place] Number:  Aaron Edelman [id number] Q Facility and Address:  [facility],  [address] [place] [address]      Provider Number: [id number]  Attending Physician Name and Address:  Erick Blinks, MD  Relative Name and Phone Number:       Current Level of Care: Hospital Recommended Level of Care: Skilled Nursing Facility Prior Approval Number:    Date Approved/Denied:   PASRR Number:    Discharge Plan: SNF    Current Diagnoses: Patient Active Problem List   Diagnosis Date Noted  . Acute cystitis without hematuria   . Acute encephalopathy 05/06/2018  . Sepsis secondary to UTI (HCC) 05/03/2018  . History of completed stroke 05/03/2018  . History of ESBL E. coli infection 05/03/2018  . Retroperitoneal bleeding 04/11/2018  . Retroperitoneal hematoma   . Goals of care, counseling/discussion   . Palliative care by specialist   . DNR (do not resuscitate) discussion   . Acute blood loss anemia 04/10/2018  . Hypotension 04/10/2018  . Seizure disorder (HCC) 04/10/2018  . RLQ abdominal pain   . Heme positive stool 02/28/2018  . E coli bacteremia 09/26/2017  . Sepsis due to Escherichia coli (E. coli) (HCC) 09/26/2017  . C. difficile colitis 09/25/2017  . Sepsis due to undetermined organism (HCC) 09/24/2017  . Left spastic hemiplegia (HCC) 09/14/2017  . Acute pyelonephritis 09/13/2017  . Hypokalemia 09/13/2017  . Anemia, unspecified 09/13/2017  . Hyperlipemia 09/13/2017  . 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 Hypertension 09/13/2017    Orientation RESPIRATION BLADDER Height & Weight     Self, Place, Time  Normal Incontinent Weight: 118 lb 13.3 oz (53.9 kg) Height:     BEHAVIORAL SYMPTOMS/MOOD NEUROLOGICAL  BOWEL NUTRITION STATUS      Incontinent Diet(DYS 3)  AMBULATORY STATUS COMMUNICATION OF NEEDS Skin   Total Care Verbally Normal                       Personal Care Assistance Level of Assistance  Bathing, Feeding, Dressing Bathing Assistance: Maximum assistance Feeding assistance: Limited assistance Dressing Assistance: Maximum assistance     Functional Limitations Info             SPECIAL CARE FACTORS FREQUENCY                       Contractures Contractures Info: Not present    Additional Factors Info  Code Status, Allergies, Psychotropic, Isolation Precautions Code Status Info: DNR Allergies Info: NKA Psychotropic Info: Depakote, Remeron   Isolation Precautions Info: ESBL in blood culture 09/15/2017      Current Medications (05/08/2018):  This is the current hospital active medication list Current Facility-Administered Medications  Medication Dose Route Frequency Provider Last Rate Last Dose  . acetaminophen (TYLENOL) tablet 650 mg  650 mg Oral Q6H PRN Opyd, Lavone Neri, MD       Or  . acetaminophen (TYLENOL) suppository 650 mg  650 mg Rectal Q6H PRN Opyd, Lavone Neri, MD      . aspirin chewable tablet 81 mg  81 mg Oral Daily Erick Blinks, MD   81 mg at 05/08/18 0937  . ceFAZolin (ANCEF) IVPB 1 g/50 mL premix  1 g Intravenous Q8H Erick Blinks, MD 100 mL/hr at 05/08/18 0609 1  g at 05/08/18 0609  . dextrose 5 %-0.9 % sodium chloride infusion   Intravenous Continuous Erick Blinks, MD 75 mL/hr at 05/08/18 0731    . enoxaparin (LOVENOX) injection 40 mg  40 mg Subcutaneous Q24H Opyd, Lavone Neri, MD   40 mg at 05/07/18 2339  . famotidine (PEPCID) tablet 20 mg  20 mg Oral Daily Opyd, Lavone Neri, MD   20 mg at 05/08/18 0937  . HYDROcodone-acetaminophen (NORCO/VICODIN) 5-325 MG per tablet 1-2 tablet  1-2 tablet Oral Q4H PRN Opyd, Lavone Neri, MD   1 tablet at 05/04/18 0952  . levETIRAcetam (KEPPRA) IVPB 500 mg/100 mL premix  500 mg Intravenous Q12H Erick Blinks,  MD 400 mL/hr at 05/08/18 0702 500 mg at 05/08/18 0702  . MEDLINE mouth rinse  15 mL Mouth Rinse BID Opyd, Lavone Neri, MD   15 mL at 05/07/18 2200  . mirtazapine (REMERON) tablet 15 mg  15 mg Oral QHS Opyd, Lavone Neri, MD   15 mg at 05/06/18 2110  . ondansetron (ZOFRAN) tablet 4 mg  4 mg Oral Q6H PRN Opyd, Lavone Neri, MD       Or  . ondansetron (ZOFRAN) injection 4 mg  4 mg Intravenous Q6H PRN Opyd, Lavone Neri, MD      . polyethylene glycol (MIRALAX / GLYCOLAX) packet 17 g  17 g Oral Daily PRN Opyd, Lavone Neri, MD      . polyethylene glycol (MIRALAX / GLYCOLAX) packet 17 g  17 g Oral BID Opyd, Lavone Neri, MD   17 g at 05/08/18 0937  . potassium chloride 10 mEq in 100 mL IVPB  10 mEq Intravenous Q1 Hr x 4 Memon, Jehanzeb, MD 100 mL/hr at 05/08/18 1119 10 mEq at 05/08/18 1119  . sertraline (ZOLOFT) tablet 50 mg  50 mg Oral Daily Opyd, Lavone Neri, MD   50 mg at 05/08/18 0937  . sodium chloride (OCEAN) 0.65 % nasal spray 1 spray  1 spray Each Nare PRN Laural Benes, Clanford L, MD   1 spray at 05/05/18 0840  . sodium chloride flush (NS) 0.9 % injection 3 mL  3 mL Intravenous Q12H Opyd, Lavone Neri, MD   3 mL at 05/08/18 0937  . valproate (DEPACON) 500 mg in dextrose 5 % 50 mL IVPB  500 mg Intravenous Q8H Erick Blinks, MD   Stopped at 05/07/18 1409     Discharge Medications: Please see discharge summary for a list of discharge medications.  Relevant Imaging Results:  Relevant Lab Results:   Additional Information    Dayle Sherpa, Juleen China, LCSW

## 2018-05-08 NOTE — Procedures (Signed)
ELECTROENCEPHALOGRAM REPORT   Patient: Lisa [name]       Room #: A341 EEG No. ID: [id number] Age: 62 y.o.        Sex: female Referring Physician: Memon Report Date:  05/08/2018        Interpreting Physician: Thana Farr  History: [name] is an 62 y.o. female intermittent periods of lethargy  Medications:  Depakote, Keppra, Pepcid, Remeron, Zoloft, ASA, Ancef  Conditions of Recording:  This is a 21 channel routine scalp EEG performed with bipolar and monopolar montages arranged in accordance to the international 10/20 system of electrode placement. One channel was dedicated to EKG recording.  The patient is in the awake and drowsy states.  Description:  The waking background activity consists of a low voltage, symmetrical, fairly well organized, 7-8 Hz thetaa activity, seen from the parieto-occipital and posterior temporal regions.  Low voltage fast activity, poorly organized, is seen anteriorly and is at times superimposed on more posterior regions.  A mixture of theta and alpha rhythms are seen from the central and temporal regions. The patient drowses with slowing to irregular, low voltage theta and beta activity.   Stage II sleep is not obtained. No epileptiform activity is noted.   Hyperventilation and intermittent photic stimulation were not performed.  IMPRESSION: This is an abnormal EEG secondary to mild posterior background slowing.  This finding may be seen with a diffuse gray matter disturbance that is etiologically nonspecific, but may include a dementia, among other possibilities.  No epileptiform activity is noted.     Thana Farr, MD Neurology [phone] 05/08/2018, 4:35 PM

## 2018-05-09 DIAGNOSIS — G8114 Spastic hemiplegia affecting left nondominant side: Secondary | ICD-10-CM

## 2018-05-09 LAB — GLUCOSE, CAPILLARY
GLUCOSE-CAPILLARY: 76 mg/dL (ref 70–99)
GLUCOSE-CAPILLARY: 87 mg/dL (ref 70–99)
GLUCOSE-CAPILLARY: 88 mg/dL (ref 70–99)
Glucose-Capillary: 77 mg/dL (ref 70–99)

## 2018-05-09 LAB — RENAL FUNCTION PANEL
ALBUMIN: 2.3 g/dL — AB (ref 3.5–5.0)
Anion gap: 9 (ref 5–15)
CALCIUM: 8.6 mg/dL — AB (ref 8.9–10.3)
CO2: 28 mmol/L (ref 22–32)
CREATININE: 0.42 mg/dL — AB (ref 0.44–1.00)
Chloride: 102 mmol/L (ref 98–111)
Glucose, Bld: 92 mg/dL (ref 70–99)
PHOSPHORUS: 2.7 mg/dL (ref 2.5–4.6)
Potassium: 3.7 mmol/L (ref 3.5–5.1)
Sodium: 139 mmol/L (ref 135–145)

## 2018-05-09 LAB — CBC WITH DIFFERENTIAL/PLATELET
Abs Immature Granulocytes: 0.09 10*3/uL — ABNORMAL HIGH (ref 0.00–0.07)
BASOS ABS: 0.1 10*3/uL (ref 0.0–0.1)
Basophils Relative: 1 %
EOS ABS: 0.5 10*3/uL (ref 0.0–0.5)
Eosinophils Relative: 5 %
HCT: 35.1 % — ABNORMAL LOW (ref 36.0–46.0)
HEMOGLOBIN: 10.6 g/dL — AB (ref 12.0–15.0)
Immature Granulocytes: 1 %
Lymphocytes Relative: 36 %
Lymphs Abs: 3.1 10*3/uL (ref 0.7–4.0)
MCH: 31.2 pg (ref 26.0–34.0)
MCHC: 30.2 g/dL (ref 30.0–36.0)
MCV: 103.2 fL — ABNORMAL HIGH (ref 80.0–100.0)
MONO ABS: 0.9 10*3/uL (ref 0.1–1.0)
Monocytes Relative: 10 %
NRBC: 0 % (ref 0.0–0.2)
Neutro Abs: 4 10*3/uL (ref 1.7–7.7)
Neutrophils Relative %: 47 %
PLATELETS: 193 10*3/uL (ref 150–400)
RBC: 3.4 MIL/uL — AB (ref 3.87–5.11)
RDW: 16.9 % — ABNORMAL HIGH (ref 11.5–15.5)
WBC: 8.6 10*3/uL (ref 4.0–10.5)

## 2018-05-09 MED ORDER — HYDROCODONE-ACETAMINOPHEN 5-325 MG PO TABS: 1.0000 | ORAL_TABLET | Freq: Four times a day (QID) | ORAL | 0 refills | Status: DC | PRN

## 2018-05-09 MED ORDER — POTASSIUM CHLORIDE CRYS ER 10 MEQ PO TBCR: 10.0000 meq | EXTENDED_RELEASE_TABLET | Freq: Every day | ORAL | Status: DC

## 2018-05-09 MED ORDER — ASPIRIN 81 MG PO CHEW: 81.0000 mg | CHEWABLE_TABLET | Freq: Every day | ORAL | Status: AC

## 2018-05-09 NOTE — Plan of Care (Signed)
  Problem: Education: Goal: Knowledge of General Education information will improve Description: Including pain rating scale, medication(s)/side effects and non-pharmacologic comfort measures Outcome: Adequate for Discharge   Problem: Health Behavior/Discharge Planning: Goal: Ability to manage health-related needs will improve Outcome: Adequate for Discharge   Problem: Clinical Measurements: Goal: Ability to maintain clinical measurements within normal limits will improve Outcome: Adequate for Discharge   Problem: Activity: Goal: Risk for activity intolerance will decrease Outcome: Adequate for Discharge   Problem: Nutrition: Goal: Adequate nutrition will be maintained Outcome: Adequate for Discharge   

## 2018-05-09 NOTE — Discharge Summary (Addendum)
Physician Discharge Summary  Lisa [name] ZOX:[id number] DOB: Nov 11, 1955 DOA: 05/03/2018  PCP: Audree Bane, DO  Admit date: 05/03/2018 Discharge date: 05/09/2018  Admitted From: SNF Disposition: SNF Recommendations for Outpatient Follow-up:  1. Follow up with primary care provider in 1 weeks 2. Please obtain BMP in one week to check potassium level. 3. Please check serum valproate level (depakote) in 5 days.   Discharge Condition: STABLE   CODE STATUS: DNR    Brief Hospitalization Summary: Please see all hospital notes, images, labs for full details of the hospitalization. HPI Dr. Karie Schwalbe. Opyd:  Lisa [name] is a 62 y.o. female with medical history significant for adjustment disorder, hypertension, history of CVA, left-sided spastic hemiplegia, aphasia, ESBL E. coli bacteremia earlier this year, and recent admission with GI bleeding, now presenting to the emergency department for evaluation of low blood pressure.  Patient is unable to contribute to the history due to her clinical condition.  She was reportedly noted to have a low blood pressure and fever at her nursing facility and directed to the ED for further evaluation of this.  ED Course: Upon arrival to the ED, patient is found to be febrile to 38.5 C, tachycardic to 140, and with stable blood pressure.  EKG features sinus tachycardia with rate 137 and repolarization abnormality.  Chest x-ray is negative for acute cardiopulmonary disease.  Chemistry panel is unremarkable.  CBC is notable for a microcytic anemia with hemoglobin of 11.1, up from 9.4 last month.  Lactic acid is elevated to 2.84.  Urinalysis is suggestive of infection.  Blood and urine cultures were collected, 30 cc/kg normal saline bolus was given, and the patient was treated with empiric Rocephin.  Tachycardia has improved, blood pressure remained stable, and the patient will be admitted for ongoing evaluation and management of sepsis secondary to UTI.  Brief Admission  Hx: Lisa [name] a 62 y.o.femalewith medical history significant foradjustment disorder, hypertension, history of CVA, left-sided spastic hemiplegia, aphasia, ESBL E. coli bacteremia earlier this year, and recent admission with GI bleeding, now presenting to the emergency department for evaluation of low blood pressure. Patient is unable to contribute to the history due to her clinical condition. She was reportedly noted to have a low blood pressure and fever at her nursing facility and directed to the ED for further evaluation of this.  MDM/Assessment & Plan: 1. Acute encephalopathy.  Resolved now.  Pt back to baseline.  Patient had intermittent periods of lethargy that was evaluated while in the hospital.  This was followed by periods of her returning to her baseline mental status.  After discussing with tele-neurologist, they seem more consistent with a seizure etiology.  It is possible that recent UTI had decreased her seizure threshold.  Depakote level was checked and noted to be too low to detect.  CT head was performed that showed remote stroke in the right frontal lobe with underlying chronic ischemic microvascular white matter disease.  There was also smaller focal chronic or late subacute infarct in the left frontal lobe.  Further assessment by MRI brain was considered, but since she has a history of aneurysm with clips, she could not be cleared to undergo MRI.  After discussing with neurologist on 11/4,  she was not felt to be candidate for TPA due to her intermittent changes in mental status, atypical presentation and findings of possible subacute infarct on CT head.  ABG did not show any elevated PCO2 and pH was normal.   Zanaflex has been discontinued.  Overall, mental status has significantly improved and appears to be baseline.  It is likely that seizure is the main etiology of her change in mental status. 2. sepsis secondary to UTI - Improved. History of ESBL UTI.  Based on  cultures, meropenem was initially changed to ciprofloxacin, but since Cipro can be associated with decreasing seizure threshold, this is been changed to cefazolin.  ABX COMPLETED.  3. History of CVA with vascular dementia - continue aspirin for secondary prevention.  4. Left spastic hemiplegia / muscle spasticity -Zanaflex discontinued due to mental status changes.  5. Essential hypertension -chronically on amlodipine.  6. Epilepsy -EEG does not show any epileptiform discharges.  She will need repeat valproic acid level in 5 days. 7. Depression - continue zoloft and remeron.  8. Hypernatremia -resolved with hypotonic fluids 9. Low phosphorus - K-phos ordered.  Corrected. 10. Hypomagnesemia -replace 11. Hypokalemia -replace  DVT prophylaxis: lovenox  Code Status: DNR  Family Communication: Discussed with sister and brother-in-law at the bedside Disposition Plan:  Inpatient for IV antibiotics  Antimicrobials:  Meropenem 11/1 > 11/4  Ciprofloxacin 11/4 > 11/5  Cefazolin 11/5 >11/7 Discharge Diagnoses:  Principal Problem:   Sepsis secondary to UTI [facility]) Active Problems:   Hypertension   Left spastic hemiplegia (HCC)   Seizure disorder (HCC)   History of completed stroke   History of ESBL E. coli infection   Acute encephalopathy   Acute cystitis without hematuria    Discharge Instructions: Discharge Instructions    Increase activity slowly   Complete by:  As directed      Allergies as of 05/09/2018   No Known Allergies     Medication List    STOP taking these medications   OXYCONTIN 10 mg 12 hr tablet Generic drug:  oxyCODONE   tiZANidine 2 MG tablet Commonly known as:  ZANAFLEX     TAKE these medications   acetaminophen 325 MG tablet Commonly known as:  TYLENOL Take by mouth every 6 (six) hours as needed for mild pain.   amLODipine 10 MG tablet Commonly known as:  NORVASC Take 1 tablet (10 mg total) by mouth daily.   aspirin 81 MG chewable tablet Chew 1  tablet (81 mg total) by mouth daily. Start taking on:  05/10/2018   calcium carbonate 500 MG chewable tablet Commonly known as:  TUMS - dosed in mg elemental calcium Chew 1 tablet by mouth daily.   divalproex 250 MG DR tablet Commonly known as:  DEPAKOTE Take 750 mg by mouth 2 (two) times daily.   famotidine 20 MG tablet Commonly known as:  PEPCID Take 20 mg by mouth daily.   HYDROcodone-acetaminophen 5-325 MG tablet Commonly known as:  NORCO/VICODIN Take 1 tablet by mouth every 6 (six) hours as needed for moderate pain.   hydroxypropyl methylcellulose / hypromellose 2.5 % ophthalmic solution Commonly known as:  ISOPTO TEARS / GONIOVISC Place 1 drop into both eyes 2 (two) times daily.   ipratropium-albuterol 0.5-2.5 (3) MG/3ML Soln Commonly known as:  DUONEB Take 3 mLs by nebulization every 6 (six) hours as needed (for shortness of breath-wheezing).   levETIRAcetam 500 MG tablet Commonly known as:  KEPPRA Take 500 mg by mouth 2 (two) times daily.   mirtazapine 15 MG tablet Commonly known as:  REMERON Take 15 mg by mouth at bedtime.   polyethylene glycol packet Commonly known as:  MIRALAX / GLYCOLAX Take 17 g by mouth 2 (two) times daily.   potassium chloride 10 MEQ tablet Commonly known  as:  K-DUR,KLOR-CON Take 1 tablet (10 mEq total) by mouth daily. What changed:    medication strength  how much to take   sertraline 50 MG tablet Commonly known as:  ZOLOFT Take 50 mg by mouth daily.   Vitamin D (Ergocalciferol) 1.25 MG (50000 UT) Caps capsule Commonly known as:  DRISDOL Take 50,000 Units by mouth 2 (two) times a week. Tuesdays and Saturdays       No Known Allergies Allergies as of 05/09/2018   No Known Allergies     Medication List    STOP taking these medications   OXYCONTIN 10 mg 12 hr tablet Generic drug:  oxyCODONE   tiZANidine 2 MG tablet Commonly known as:  ZANAFLEX     TAKE these medications   acetaminophen 325 MG tablet Commonly known  as:  TYLENOL Take by mouth every 6 (six) hours as needed for mild pain.   amLODipine 10 MG tablet Commonly known as:  NORVASC Take 1 tablet (10 mg total) by mouth daily.   aspirin 81 MG chewable tablet Chew 1 tablet (81 mg total) by mouth daily. Start taking on:  05/10/2018   calcium carbonate 500 MG chewable tablet Commonly known as:  TUMS - dosed in mg elemental calcium Chew 1 tablet by mouth daily.   divalproex 250 MG DR tablet Commonly known as:  DEPAKOTE Take 750 mg by mouth 2 (two) times daily.   famotidine 20 MG tablet Commonly known as:  PEPCID Take 20 mg by mouth daily.   HYDROcodone-acetaminophen 5-325 MG tablet Commonly known as:  NORCO/VICODIN Take 1 tablet by mouth every 6 (six) hours as needed for moderate pain.   hydroxypropyl methylcellulose / hypromellose 2.5 % ophthalmic solution Commonly known as:  ISOPTO TEARS / GONIOVISC Place 1 drop into both eyes 2 (two) times daily.   ipratropium-albuterol 0.5-2.5 (3) MG/3ML Soln Commonly known as:  DUONEB Take 3 mLs by nebulization every 6 (six) hours as needed (for shortness of breath-wheezing).   levETIRAcetam 500 MG tablet Commonly known as:  KEPPRA Take 500 mg by mouth 2 (two) times daily.   mirtazapine 15 MG tablet Commonly known as:  REMERON Take 15 mg by mouth at bedtime.   polyethylene glycol packet Commonly known as:  MIRALAX / GLYCOLAX Take 17 g by mouth 2 (two) times daily.   potassium chloride 10 MEQ tablet Commonly known as:  K-DUR,KLOR-CON Take 1 tablet (10 mEq total) by mouth daily. What changed:    medication strength  how much to take   sertraline 50 MG tablet Commonly known as:  ZOLOFT Take 50 mg by mouth daily.   Vitamin D (Ergocalciferol) 1.25 MG (50000 UT) Caps capsule Commonly known as:  DRISDOL Take 50,000 Units by mouth 2 (two) times a week. Tuesdays and Saturdays       Procedures/Studies: Ct Head Wo Contrast  Result Date: 05/06/2018 CLINICAL DATA:  Altered mental  status. Altered level of consciousness. EXAM: CT HEAD WITHOUT CONTRAST TECHNIQUE: Contiguous axial images were obtained from the base of the skull through the vertex without intravenous contrast. COMPARISON:  None. FINDINGS: Brain: Extensive streak artifact from metal density along the right sellar region., probably related to prior cerebral aneurysm repair. The cerebellum appears normal. Remote encephalomalacia in the right frontal lobe with some evidence of Wallerian degeneration and reduced size of the right cerebral peduncle resulting. Thin corpus callosum and suspected remote infarct involving the head of the right caudate nucleus. Periventricular white matter and corona radiata hypodensities favor chronic ischemic  microvascular white matter disease. Mildly confluent white matter hypodensity and focal loss of gray-white junction in the left frontal lobe on image 24/4 and image 25/2. This could be a chronic or subacute left frontal lobe infarct. Upper normal size of the ventricular systems and temporal horns. No intracranial hemorrhage or mass lesion is identified. Vascular: Streak artifact obscures the middle cerebral arteries. There is atherosclerotic calcification of the cavernous carotid arteries bilaterally. Skull: Suspected remote left frontal burr hole on image 52/3. No acute calvarial findings. Sinuses/Orbits: Acute on chronic right maxillary sinusitis with an air-fluid level. Chronic ethmoid, sphenoid, left frontal, and left maxillary sinusitis. Other: No supplemental non-categorized findings. IMPRESSION: 1. Remote stroke in the right frontal lobe with underlying chronic ischemic microvascular white matter disease. 2. Smaller focal chronic or late subacute infarct in the left frontal lobe. MRI could be utilized to assess chronicity or for occult acute stroke if clinically warranted. 3. Streak artifact from metal density along the right sellar region likely related to prior cerebral aneurysm repair. 4.  Acute on chronic right maxillary sinusitis with air-level. Other chronic paranasal sinusitis is noted. Electronically Signed   By: Gaylyn Rong M.D.   On: 05/06/2018 14:15   Ct Abdomen Pelvis W Contrast  Result Date: 04/10/2018 CLINICAL DATA:  RIGHT lower quadrant pain, hypotension, fall in hemoglobin, acute on chronic anemia, elevated lactic acid; history LEFT spastic hemiplegia, prior C difficile colitis EXAM: CT ABDOMEN AND PELVIS WITH CONTRAST TECHNIQUE: Multidetector CT imaging of the abdomen and pelvis was performed using the standard protocol following bolus administration of intravenous contrast. Sagittal and coronal MPR images reconstructed from axial data set. CONTRAST:  ISOVUE-300 IOPAMIDOL (ISOVUE-300) INJECTION 61% IV. Dilute oral contrast. COMPARISON:  09/13/2017 FINDINGS: Lower chest: Minimal LEFT pleural effusion and bibasilar atelectasis Hepatobiliary: Gallbladder and liver unremarkable Pancreas: Normal appearance Spleen: Normal appearance Adrenals/Urinary Tract: Adrenal glands normal appearance. Small BILATERAL renal cysts. Kidneys and ureters normal appearance. Question wall thickening of the RIGHT anterolateral bladder wall, see below. Stomach/Bowel: Normal appendix. Markedly increased stool in rectum and distal sigmoid colon, rectum up to 8.5 cm diameter. Minimal rectal wall thickening, decreased from previous exam. Scattered stool throughout remainder of colon. Stomach decompressed. Remaining bowel loops unremarkable. Vascular/Lymphatic: Atherosclerotic calcifications aorta, iliac arteries, common femoral arteries, coronary arteries. No adenopathy. Reproductive: Atrophic uterus and ovaries Other: Diffuse intermediate attenuation infiltrative changes are identified in the RIGHT retroperitoneum beginning inferior to the RIGHT kidney at the posterior pararenal fascia and extending into the anterior RIGHT pelvis, with some extension into the RIGHT lateral side wall and into the  RIGHT anterior and lateral prevesical space compatible with retroperitoneal hemorrhage. Areas of hemorrhage measure up to 6.1 x 2.3 cm at the base of the RIGHT pericolic gutter and 6.2 x 2.0 cm at the RIGHT prevesical space. Hemorrhage overall approximately 12.5 cm length. Associated mild thickening of the anterolateral RIGHT bladder wall. Source of hemorrhage is uncertain; no aneurysm or mass identified, and no etiology arising from the inferior RIGHT kidney or RIGHT psoas muscle identified. No ascites or free air. Mild stranding and presacral space, nonspecific. No hernia. Musculoskeletal: Scattered subcutaneous edema at the flanks. Bones demineralized. IMPRESSION: RIGHT retroperitoneal hemorrhage in the pelvis as above, of uncertain etiology, maximal dimensions of 12.5 cm length, 2.2 cm thick, and 6.2 cm transverse. Question minimal bladder wall thickening versus artifact in artifact at the anterolateral RIGHT urinary bladder. Markedly increased stool in rectum and distal sigmoid colon. Bibasilar atelectasis and small LEFT pleural effusion. Findings discussed with Dr.  Rourk on 04/10/2018 at 1440 hours. Electronically Signed   By: Ulyses Southward M.D.   On: 04/10/2018 14:54   Dg Chest Port 1 View  Result Date: 05/05/2018 CLINICAL DATA:  62 year old female with a history of chest congestion and possible aspiration. EXAM: PORTABLE CHEST 1 VIEW COMPARISON:  05/03/2018 FINDINGS: Cardiomediastinal silhouette unchanged in size and contour. Low lung volumes with linear opacities at the bilateral lung bases. No pneumothorax. No pleural effusion. No displaced fracture. IMPRESSION: Low lung volumes with linear opacities, potentially atelectasis/consolidation. Electronically Signed   By: Gilmer Mor D.O.   On: 05/05/2018 14:49   Dg Chest Port 1 View  Result Date: 05/03/2018 CLINICAL DATA:  62 y/o  F; fever. Code sepsis. EXAM: PORTABLE CHEST 1 VIEW COMPARISON:  04/10/2018 chest radiograph FINDINGS: Stable cardiac  silhouette given projection and technique. Lung apices partially obscured by patient's chin. No consolidation, effusion, or pneumothorax identified. Bones are unremarkable. IMPRESSION: No acute pulmonary process identified. Electronically Signed   By: Mitzi Hansen M.D.   On: 05/03/2018 20:27   Dg Chest Port 1 View  Result Date: 04/10/2018 CLINICAL DATA:  Acute onset shortness of breath. EXAM: PORTABLE CHEST 1 VIEW COMPARISON:  09/13/2017. FINDINGS: Markedly suboptimal inspiration with linear atelectasis at both lung bases. Lungs otherwise clear. Pulmonary vascularity normal. No pleural effusions. Cardiac silhouette normal in size for AP portable technique. IMPRESSION: Markedly suboptimal inspiration which accounts for atelectasis at the lung bases. No acute cardiopulmonary disease otherwise. Electronically Signed   By: Hulan Saas M.D.   On: 04/10/2018 11:43   Dg Swallowing Func-speech Pathology  Result Date: 05/07/2018 Objective Swallowing Evaluation: Type of Study: MBS-Modified Barium Swallow Study  Patient Details Name: Adelina Collard MRN: [id number] Date of Birth: Sep 18, 1955 Today's Date: 05/07/2018 Time: SLP Start Time (ACUTE ONLY): 1200 -SLP Stop Time (ACUTE ONLY): 1225 SLP Time Calculation (min) (ACUTE ONLY): 25 min Past Medical History: Past Medical History: Diagnosis Date . Abnormal posture  . Adjustment disorder  . Anemia, unspecified  . Aphasia-angular gyrus syndrome  . Arthritis, climacteric, knee, left  . Atypical psychosis (HCC)  . C. difficile diarrhea  . Cerebral arteritis  . Contracture of left ankle  . Essential hypertension, malignant  . Essential hypertension, malignant  . Hyperlipemia  . Left spastic hemiplegia (HCC)  . Muscle weakness (generalized)  . 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 Oropharyngeal dysphagia  . Other sequelae following nontraumatic subarachnoid hemorrhage  . Primary osteoarthritis of both knees  . Screening for  thyroid disorder  . Screening for thyroid disorder  . Seizures (HCC)  . Sequelae of cerebral infarction  . Unsteady  . Vascular dementia with delirium [facility])  Past Surgical History: Past Surgical History: Procedure Laterality Date . CEREBRAL ANEURYSM REPAIR    2017? then suffered stroke HPI: Zanai Mallari is a 62 y.o. female with medical history significant for adjustment disorder, hypertension, history of CVA, left-sided spastic hemiplegia, aphasia, ESBL E. coli bacteremia earlier this year, and recent admission with GI bleeding, now presenting to the emergency department for evaluation of low blood pressure.  Patient is unable to contribute to the history due to her clinical condition.  She was reportedly noted to have a low blood pressure and fever at her nursing facility and directed to the ED for further evaluation of this.  Subjective: "I wish I had my shoes." Assessment / Plan / Recommendation CHL IP CLINICAL IMPRESSIONS 05/07/2018 Clinical Impression Pt presents with mild oropharyngeal phase dysphagia characterized by delayed oral transit  with solids and min premature spillage with delay in swallow initiation with liquids with swallow trigger at the level of the valleculae (teaspoon) and pyriforms (cup/straw). No penetration, aspiration, or significant residuals exhibited throughout the study despite sub optimal head positioning (head leaning right). Recommend D3/mech soft due to head positioning and occasional labial spillage and thin liquids via cup/straw and po meds whole with water. Pt will need feeder assist and only feed Pt when alert and upright given intermittent lethargy. SLP will sign off. Above to MD and RN.  SLP Visit Diagnosis Dysphagia, oropharyngeal phase (R13.12) Attention and concentration deficit following -- Frontal lobe and executive function deficit following -- Impact on safety and function Mild aspiration risk   CHL IP TREATMENT RECOMMENDATION 05/07/2018 Treatment Recommendations No  treatment recommended at this time   Prognosis 05/07/2018 Prognosis for Safe Diet Advancement Good Barriers to Reach Goals Cognitive deficits Barriers/Prognosis Comment -- CHL IP DIET RECOMMENDATION 05/07/2018 SLP Diet Recommendations Dysphagia 3 (Mech soft) solids;Thin liquid Liquid Administration via Cup;Straw Medication Administration Whole meds with liquid Compensations Slow rate Postural Changes Remain semi-upright after after feeds/meals (Comment);Seated upright at 90 degrees   CHL IP OTHER RECOMMENDATIONS 05/07/2018 Recommended Consults -- Oral Care Recommendations Oral care BID;Staff/trained caregiver to provide oral care Other Recommendations Clarify dietary restrictions   CHL IP FOLLOW UP RECOMMENDATIONS 05/07/2018 Follow up Recommendations Skilled Nursing facility   No flowsheet data found.     CHL IP ORAL PHASE 05/07/2018 Oral Phase Impaired Oral - Pudding Teaspoon -- Oral - Pudding Cup -- Oral - Honey Teaspoon -- Oral - Honey Cup -- Oral - Nectar Teaspoon -- Oral - Nectar Cup -- Oral - Nectar Straw -- Oral - Thin Teaspoon -- Oral - Thin Cup -- Oral - Thin Straw -- Oral - Puree Right anterior bolus loss Oral - Mech Soft -- Oral - Regular -- Oral - Multi-Consistency -- Oral - Pill -- Oral Phase - Comment --  CHL IP PHARYNGEAL PHASE 05/07/2018 Pharyngeal Phase Impaired Pharyngeal- Pudding Teaspoon -- Pharyngeal -- Pharyngeal- Pudding Cup -- Pharyngeal -- Pharyngeal- Honey Teaspoon -- Pharyngeal -- Pharyngeal- Honey Cup -- Pharyngeal -- Pharyngeal- Nectar Teaspoon -- Pharyngeal -- Pharyngeal- Nectar Cup -- Pharyngeal -- Pharyngeal- Nectar Straw -- Pharyngeal -- Pharyngeal- Thin Teaspoon Delayed swallow initiation-pyriform sinuses Pharyngeal -- Pharyngeal- Thin Cup Delayed swallow initiation-pyriform sinuses Pharyngeal -- Pharyngeal- Thin Straw Delayed swallow initiation-pyriform sinuses Pharyngeal -- Pharyngeal- Puree Delayed swallow initiation-vallecula;Pharyngeal residue - valleculae Pharyngeal -- Pharyngeal-  Mechanical Soft -- Pharyngeal -- Pharyngeal- Regular WFL;Delayed swallow initiation-vallecula Pharyngeal -- Pharyngeal- Multi-consistency -- Pharyngeal -- Pharyngeal- Pill WFL Pharyngeal -- Pharyngeal Comment --  CHL IP CERVICAL ESOPHAGEAL PHASE 05/07/2018 Cervical Esophageal Phase WFL Pudding Teaspoon -- Pudding Cup -- Honey Teaspoon -- Honey Cup -- Nectar Teaspoon -- Nectar Cup -- Nectar Straw -- Thin Teaspoon -- Thin Cup -- Thin Straw -- Puree -- Mechanical Soft -- Regular -- Multi-consistency -- Pill -- Cervical Esophageal Comment -- Thank you, Havery Moros, CCC-SLP [phone] PORTER,DABNEY 05/07/2018, 5:11 PM                Subjective: Pt without complaints appears to be at her baseline.  She is tolerating diet.    Discharge Exam: Vitals:   05/08/18 2044 05/09/18 0437  BP: (!) 162/93 (!) 164/109  Pulse: 88 (!) 111  Resp: 20 20  Temp: 98.2 F (36.8 C) 98.6 F (37 C)  SpO2: 95% 100%   Vitals:   05/08/18 0417 05/08/18 1515 05/08/18 2044 05/09/18 0437  BP: Marland Kitchen)  153/92 (!) 140/93 (!) 162/93 (!) 164/109  Pulse: 89 76 88 (!) 111  Resp: 18 16 20 20   Temp: 98.1 F (36.7 C)  98.2 F (36.8 C) 98.6 F (37 C)  TempSrc: Oral  Oral Oral  SpO2: 100% 100% 95% 100%  Weight:        General exam: chronically ill appearing, Alert, awake, no distress. Cooperative.  Respiratory system: Clear to auscultation. Respiratory effort normal. Cardiovascular system:normal s1,s2 sounds. No murmurs, rubs, gallops. Gastrointestinal system: Abdomen is nondistended, soft and nontender. No organomegaly or masses felt. Normal bowel sounds heard. Central nervous system: Left-sided weakness (chronic) Extremities: bilateral heel protection in place. No C/C/E, +pedal pulses Skin: No rashes, lesions or ulcers Psychiatry: pleasant, awake, cooperative.    The results of significant diagnostics from this hospitalization (including imaging, microbiology, ancillary and laboratory) are listed below for reference.      Microbiology: Recent Results (from the past 240 hour(s))  Blood Culture (routine x 2)     Status: None   Collection Time: 05/03/18  7:54 PM  Result Value Ref Range Status   Specimen Description BLOOD RIGHT ARM  Final   Special Requests   Final    BOTTLES DRAWN AEROBIC AND ANAEROBIC Blood Culture adequate volume   Culture   Final    NO GROWTH 5 DAYS Performed at [facility], [address] [place], [place] [address]    Report Status 05/08/2018 FINAL  Final  Blood Culture (routine x 2)     Status: None   Collection Time: 05/03/18  8:07 PM  Result Value Ref Range Status   Specimen Description BLOOD LEFT ARM  Final   Special Requests   Final    BOTTLES DRAWN AEROBIC AND ANAEROBIC Blood Culture adequate volume   Culture   Final    NO GROWTH 5 DAYS Performed at [facility], [address], [place], [place] [address]    Report Status 05/08/2018 FINAL  Final  Urine culture     Status: Abnormal   Collection Time: 05/03/18  8:51 PM  Result Value Ref Range Status   Specimen Description   Final    URINE, CLEAN CATCH Performed at [facility], [address], [place], [place] [address]    Special Requests   Final    NONE Performed at [facility], [address], [place], [place] [address]    Culture >=100,000 COLONIES/mL STAPHYLOCOCCUS SIMULANS (A)  Final   Report Status 05/06/2018 FINAL  Final   Organism ID, Bacteria STAPHYLOCOCCUS SIMULANS (A)  Final      Susceptibility   Staphylococcus simulans - MIC*    CIPROFLOXACIN <=0.5 SENSITIVE Sensitive     GENTAMICIN <=0.5 SENSITIVE Sensitive     NITROFURANTOIN <=16 SENSITIVE Sensitive     OXACILLIN <=0.25 SENSITIVE Sensitive     TETRACYCLINE <=1 SENSITIVE Sensitive     VANCOMYCIN <=0.5 SENSITIVE Sensitive     TRIMETH/SULFA <=10 SENSITIVE Sensitive     CLINDAMYCIN RESISTANT Resistant     RIFAMPIN <=0.5 SENSITIVE Sensitive     Inducible Clindamycin POSITIVE Resistant     * >=100,000 COLONIES/mL STAPHYLOCOCCUS SIMULANS  MRSA PCR  Screening     Status: None   Collection Time: 05/04/18 12:46 AM  Result Value Ref Range Status   MRSA by PCR NEGATIVE NEGATIVE Final    Comment:        The GeneXpert MRSA Assay (FDA approved for NASAL specimens only), is one component of a comprehensive MRSA colonization surveillance program. It is not intended  to diagnose MRSA infection nor to guide or monitor treatment for MRSA infections. Performed at [facility], [address] [place], [place] [address]      Labs: BNP (last 3 results) No results for input(s): BNP in the last 8760 hours. Basic Metabolic Panel: Recent Labs  Lab 05/04/18 0554 05/05/18 0624 05/06/18 0529 05/07/18 0502 05/08/18 0424 05/09/18 0518  NA  --  146* 143 138 141 139  K  --  3.5 3.7 3.0* 3.2* 3.7  CL  --  110 104 100 104 102  CO2  --  29 32 29 29 28   GLUCOSE  --  134* 83 113* 94 92  BUN  --  <5* <5* <5* <5* <5*  CREATININE  --  0.42* 0.40* 0.49 0.37* 0.42*  CALCIUM  --  8.1* 8.3* 8.2* 8.0* 8.6*  MG 2.0 1.5* 1.8 1.5* 2.6*  --   PHOS  --  1.6* 2.4* 3.1 2.7 2.7   Liver Function Tests: Recent Labs  Lab 05/03/18 1954 05/05/18 0624 05/06/18 0529 05/07/18 0502 05/08/18 0424 05/09/18 0518  AST 29  --   --   --   --   --   ALT 16  --   --   --   --   --   ALKPHOS 63  --   --   --   --   --   BILITOT 0.9  --   --   --   --   --   PROT 7.4  --   --   --   --   --   ALBUMIN 2.6* 2.2* 2.1* 2.3* 2.1* 2.3*   No results for input(s): LIPASE, AMYLASE in the last 168 hours. Recent Labs  Lab 05/06/18 1955  AMMONIA 24   CBC: Recent Labs  Lab 05/05/18 0624 05/06/18 0529 05/07/18 0502 05/08/18 0424 05/09/18 0518  WBC 6.6 6.9 7.9 9.2 8.6  NEUTROABS 4.1 3.9 4.7 4.9 4.0  HGB 9.2* 9.7* 9.8* 9.6* 10.6*  HCT 32.1* 32.0* 32.0* 31.7* 35.1*  MCV 103.5* 101.9* 99.1 101.3* 103.2*  PLT 93* 106* 120* 154 193   Cardiac Enzymes: No results for input(s): CKTOTAL, CKMB, CKMBINDEX, TROPONINI in the last 168 hours. BNP: Invalid input(s):  POCBNP CBG: Recent Labs  Lab 05/08/18 1630 05/08/18 2045 05/09/18 0011 05/09/18 0432 05/09/18 0801  GLUCAP 92 72 88 77 76   D-Dimer No results for input(s): DDIMER in the last 72 hours. Hgb A1c No results for input(s): HGBA1C in the last 72 hours. Lipid Profile No results for input(s): CHOL, HDL, LDLCALC, TRIG, CHOLHDL, LDLDIRECT in the last 72 hours. Thyroid function studies No results for input(s): TSH, T4TOTAL, T3FREE, THYROIDAB in the last 72 hours.  Invalid input(s): FREET3 Anemia work up No results for input(s): VITAMINB12, FOLATE, FERRITIN, TIBC, IRON, RETICCTPCT in the last 72 hours. Urinalysis    Component Value Date/Time   COLORURINE AMBER (A) 05/03/2018 2051   APPEARANCEUR CLOUDY (A) 05/03/2018 2051   LABSPEC 1.018 05/03/2018 2051   PHURINE 7.0 05/03/2018 2051   GLUCOSEU NEGATIVE 05/03/2018 2051   HGBUR MODERATE (A) 05/03/2018 2051   BILIRUBINUR NEGATIVE 05/03/2018 2051   KETONESUR 20 (A) 05/03/2018 2051   PROTEINUR 30 (A) 05/03/2018 2051   NITRITE POSITIVE (A) 05/03/2018 2051   LEUKOCYTESUR LARGE (A) 05/03/2018 2051   Sepsis Labs Invalid input(s): PROCALCITONIN,  WBC,  LACTICIDVEN Microbiology Recent Results (from the past 240 hour(s))  Blood Culture (routine x 2)     Status: None  Collection Time: 05/03/18  7:54 PM  Result Value Ref Range Status   Specimen Description BLOOD RIGHT ARM  Final   Special Requests   Final    BOTTLES DRAWN AEROBIC AND ANAEROBIC Blood Culture adequate volume   Culture   Final    NO GROWTH 5 DAYS Performed at [facility], [address] [place], [place] [address]    Report Status 05/08/2018 FINAL  Final  Blood Culture (routine x 2)     Status: None   Collection Time: 05/03/18  8:07 PM  Result Value Ref Range Status   Specimen Description BLOOD LEFT ARM  Final   Special Requests   Final    BOTTLES DRAWN AEROBIC AND ANAEROBIC Blood Culture adequate volume   Culture   Final    NO GROWTH 5 DAYS Performed at [facility], [address] [place], [place] [address]    Report Status 05/08/2018 FINAL  Final  Urine culture     Status: Abnormal   Collection Time: 05/03/18  8:51 PM  Result Value Ref Range Status   Specimen Description   Final    URINE, CLEAN CATCH Performed at [facility], [address] [place], [place] [address]    Special Requests   Final    NONE Performed at [facility], [address] [place], [place] [address]    Culture >=100,000 COLONIES/mL STAPHYLOCOCCUS SIMULANS (A)  Final   Report Status 05/06/2018 FINAL  Final   Organism ID, Bacteria STAPHYLOCOCCUS SIMULANS (A)  Final      Susceptibility   Staphylococcus simulans - MIC*    CIPROFLOXACIN <=0.5 SENSITIVE Sensitive     GENTAMICIN <=0.5 SENSITIVE Sensitive     NITROFURANTOIN <=16 SENSITIVE Sensitive     OXACILLIN <=0.25 SENSITIVE Sensitive     TETRACYCLINE <=1 SENSITIVE Sensitive     VANCOMYCIN <=0.5 SENSITIVE Sensitive     TRIMETH/SULFA <=10 SENSITIVE Sensitive     CLINDAMYCIN RESISTANT Resistant     RIFAMPIN <=0.5 SENSITIVE Sensitive     Inducible Clindamycin POSITIVE Resistant     * >=100,000 COLONIES/mL STAPHYLOCOCCUS SIMULANS  MRSA PCR Screening     Status: None   Collection Time: 05/04/18 12:46 AM  Result Value Ref Range Status   MRSA by PCR NEGATIVE NEGATIVE Final    Comment:        The GeneXpert MRSA Assay (FDA approved for NASAL specimens only), is one component of a comprehensive MRSA colonization surveillance program. It is not intended to diagnose MRSA infection nor to guide or monitor treatment for MRSA infections. Performed at [facility], [address] [place], [place] [address]    Time coordinating discharge: 36 minutes   SIGNED:  Standley Dakins, MD  [facility] 05/09/2018, 10:37 AM Pager [phone]  If 7PM-7AM, please contact night-coverage [email or web address] Password [email or web address]

## 2018-05-09 NOTE — Progress Notes (Signed)
Patient discharging back to [facility].  IV removed - WNL.  Report called to [place] at facility.  AVS placed in packet for transport.  Awaiting arrival of EMS - patient in NAD at this time

## 2018-05-09 NOTE — Clinical Social Work Note (Signed)
Facility notified of discharge. Discharge clinicals sent via [employer].  Patient's sister notified of discharge.  RN to call EMS.   LCSW signing off.    Makenleigh Crownover, Juleen China, LCSW

## 2018-05-23 ENCOUNTER — Ambulatory Visit: Payer: Medicaid Other | Admitting: Gastroenterology

## 2019-09-05 ENCOUNTER — Emergency Department (HOSPITAL_COMMUNITY): Payer: Medicaid Other

## 2019-09-05 ENCOUNTER — Other Ambulatory Visit: Payer: Self-pay

## 2019-09-05 ENCOUNTER — Encounter (HOSPITAL_COMMUNITY): Payer: Self-pay

## 2019-09-05 ENCOUNTER — Emergency Department (HOSPITAL_COMMUNITY)
Admission: EM | Admit: 2019-09-05 | Discharge: 2019-09-05 | Disposition: A | Discharge status: Skilled Nursing Facility | Payer: Medicaid Other | Attending: Emergency Medicine | Admitting: Emergency Medicine

## 2019-09-05 DIAGNOSIS — Y999 Unspecified external cause status: Secondary | ICD-10-CM | POA: Insufficient documentation

## 2019-09-05 DIAGNOSIS — Y929 Unspecified place or not applicable: Secondary | ICD-10-CM | POA: Insufficient documentation

## 2019-09-05 DIAGNOSIS — Y9389 Activity, other specified: Secondary | ICD-10-CM | POA: Diagnosis not present

## 2019-09-05 DIAGNOSIS — S82125A Nondisplaced fracture of lateral condyle of left tibia, initial encounter for closed fracture: Secondary | ICD-10-CM | POA: Diagnosis not present

## 2019-09-05 DIAGNOSIS — S8992XA Unspecified injury of left lower leg, initial encounter: Secondary | ICD-10-CM | POA: Diagnosis present

## 2019-09-05 DIAGNOSIS — S82142A Displaced bicondylar fracture of left tibia, initial encounter for closed fracture: Secondary | ICD-10-CM

## 2019-09-05 DIAGNOSIS — X58XXXA Exposure to other specified factors, initial encounter: Secondary | ICD-10-CM | POA: Insufficient documentation

## 2019-09-05 MED ORDER — HYDROCODONE-ACETAMINOPHEN 5-325 MG PO TABS: ORAL_TABLET | ORAL | 0 refills | Status: DC

## 2019-09-05 NOTE — Discharge Instructions (Addendum)
Lisa [name] has a tibial plateau fracture of her left knee.  We have applied a long leg splint that she will need to keep in place.  Dr. Austin Miles office would like to see her in his office in 2 weeks.  Call for an appt time.

## 2019-09-05 NOTE — ED Provider Notes (Signed)
[facility] EMERGENCY DEPARTMENT Provider Note   CSN: [id number] Arrival date & time: 09/05/19  0800     History Chief Complaint  Patient presents with  . Knee Pain    Lisa [name] is a 64 y.o. female.  HPI      Lisa [name] is a 64 y.o. female with past medical history of hypertension, seizures, dementia, and contracture of the left ankle.  She currently resides at the [facility] in [place].  She presents to the Emergency Department complaining of pain and swelling of her left knee.  She states that she has noticed knee pain and swelling for "several days" and she is unclear if there has been a injury.  She describes a constant throbbing pain to her knee and increased swelling.  Per nursing report from the [facility], she has had a previous x-ray of the knee that shows a possible tibial plateau fracture and is believed the injury occurred during physical therapy.  Details are unclear.  States that she was sent to the emergency department for a CT of her knee.    Past Medical History:  Diagnosis Date  . Abnormal posture   . Adjustment disorder   . Anemia, unspecified   . Aphasia-angular gyrus syndrome   . Arthritis, climacteric, knee, left   . Atypical psychosis (HCC)   . C. difficile diarrhea   . Cerebral arteritis   . Contracture of left ankle   . Essential hypertension, malignant   . Essential hypertension, malignant   . Hyperlipemia   . Left spastic hemiplegia (HCC)   . Muscle weakness (generalized)   . 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 Oropharyngeal dysphagia   . Other sequelae following nontraumatic subarachnoid hemorrhage   . Primary osteoarthritis of both knees   . Screening for thyroid disorder   . Screening for thyroid disorder   . Seizures (HCC)   . Sequelae of cerebral infarction   . Unsteady   . Vascular dementia with delirium [facility])     Patient Active Problem List   Diagnosis Date Noted  .  Acute cystitis without hematuria   . Acute encephalopathy 05/06/2018  . Sepsis secondary to UTI (HCC) 05/03/2018  . History of completed stroke 05/03/2018  . History of ESBL E. coli infection 05/03/2018  . Retroperitoneal bleeding 04/11/2018  . Retroperitoneal hematoma   . Goals of care, counseling/discussion   . Palliative care by specialist   . DNR (do not resuscitate) discussion   . Acute blood loss anemia 04/10/2018  . Hypotension 04/10/2018  . Seizure disorder (HCC) 04/10/2018  . RLQ abdominal pain   . Heme positive stool 02/28/2018  . E coli bacteremia 09/26/2017  . Sepsis due to Escherichia coli (E. coli) (HCC) 09/26/2017  . C. difficile colitis 09/25/2017  . Sepsis due to undetermined organism (HCC) 09/24/2017  . Left spastic hemiplegia (HCC) 09/14/2017  . Acute pyelonephritis 09/13/2017  . Hypokalemia 09/13/2017  . Anemia, unspecified 09/13/2017  . Hyperlipemia 09/13/2017  . 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 Hypertension 09/13/2017    Past Surgical History:  Procedure Laterality Date  . CEREBRAL ANEURYSM REPAIR     2017? then suffered stroke     OB History   No obstetric history on file.     Family History  Problem Relation Age of Onset  . Hypertension Other   . Colon cancer Father        diagnosed in his 24s   .  Colon polyps Sister     Social History   Tobacco Use  . Smoking status: Never Smoker  . Smokeless tobacco: Never Used  Substance Use Topics  . Alcohol use: No  . Drug use: No    Home Medications Prior to Admission medications   Medication Sig Start Date End Date Taking? Authorizing Provider  acetaminophen (TYLENOL) 325 MG tablet Take by mouth every 6 (six) hours as needed for mild pain.    [provider]  amLODipine (NORVASC) 10 MG tablet Take 1 tablet (10 mg total) by mouth daily. 04/13/18   Catarina Hartshorn, MD  aspirin 81 MG chewable tablet Chew 1 tablet (81 mg total) by mouth  daily. 05/10/18   Johnson, Clanford L, MD  calcium carbonate (TUMS - DOSED IN MG ELEMENTAL CALCIUM) 500 MG chewable tablet Chew 1 tablet by mouth daily.    [provider]  divalproex (DEPAKOTE) 250 MG DR tablet Take 750 mg by mouth 2 (two) times daily.     [provider]  famotidine (PEPCID) 20 MG tablet Take 20 mg by mouth daily.    [provider]  HYDROcodone-acetaminophen (NORCO) 5-325 MG tablet Take 1 tablet by mouth every 6 (six) hours as needed for moderate pain. 05/09/18   Johnson, Clanford L, MD  hydroxypropyl methylcellulose / hypromellose (ISOPTO TEARS / GONIOVISC) 2.5 % ophthalmic solution Place 1 drop into both eyes 2 (two) times daily.    [provider]  ipratropium-albuterol (DUONEB) 0.5-2.5 (3) MG/3ML SOLN Take 3 mLs by nebulization every 6 (six) hours as needed (for shortness of breath-wheezing).    [provider]  levETIRAcetam (KEPPRA) 500 MG tablet Take 500 mg by mouth 2 (two) times daily.    [provider]  mirtazapine (REMERON) 15 MG tablet Take 15 mg by mouth at bedtime.    [provider]  polyethylene glycol (MIRALAX / GLYCOLAX) packet Take 17 g by mouth 2 (two) times daily. 04/13/18   Catarina Hartshorn, MD  potassium chloride SA (K-DUR,KLOR-CON) 10 MEQ tablet Take 1 tablet (10 mEq total) by mouth daily. 05/09/18   Johnson, Clanford L, MD  sertraline (ZOLOFT) 50 MG tablet Take 50 mg by mouth daily.    [provider]  Vitamin D, Ergocalciferol, (DRISDOL) 50000 units CAPS capsule Take 50,000 Units by mouth 2 (two) times a week. Tuesdays and Saturdays    [provider]    Allergies    Patient has no known allergies.  Review of Systems   Review of Systems  Constitutional: Negative for chills and fever.  Respiratory: Negative for shortness of breath.   Cardiovascular: Negative for chest pain.  Musculoskeletal: Positive for arthralgias (Left knee pain and swelling). Negative for back pain and neck  pain.  Skin: Negative for color change and wound.  Neurological: Negative for dizziness, weakness, numbness and headaches.    Physical Exam Updated Vital Signs BP (!) 150/80 (BP Location: Right Arm)   Pulse 95   Temp 98.5 F (36.9 C) (Oral)   Resp 15   Ht 5\' 4"  (1.626 m)   Wt 59 kg   SpO2 93%   BMI 22.31 kg/m   Physical Exam Vitals and nursing note reviewed.  Constitutional:      General: She is not in acute distress.    Appearance: Normal appearance. She is not ill-appearing.  Cardiovascular:     Rate and Rhythm: Normal rate and regular rhythm.     Pulses: Normal pulses.  Pulmonary:     Effort:  Pulmonary effort is normal. No respiratory distress.  Abdominal:     Palpations: Abdomen is soft.     Tenderness: There is no abdominal tenderness.  Musculoskeletal:        General: Swelling, tenderness and signs of injury present.     Cervical back: Normal range of motion.     Comments: Diffuse tenderness and edema of the anterior left knee.  Mild palpable effusion noted.  No erythema or excessive warmth.  No calf pain or tenderness on exam.  No palpable cord.  Compartments are soft.  Skin:    General: Skin is warm.     Capillary Refill: Capillary refill takes less than 2 seconds.  Neurological:     General: No focal deficit present.     Mental Status: She is alert.     Sensory: No sensory deficit.     Motor: No weakness.     ED Results / Procedures / Treatments   Labs (all labs ordered are listed, but only abnormal results are displayed) Labs Reviewed - No data to display  EKG None  Radiology CT Knee Left Wo Contrast  Result Date: 09/05/2019 CLINICAL DATA:  Left tibial plateau fracture EXAM: CT OF THE LEFT KNEE WITHOUT CONTRAST TECHNIQUE: Multidetector CT imaging of the LEFT knee was performed according to the standard protocol. Multiplanar CT image reconstructions were also generated. COMPARISON:  None. FINDINGS: Bones/Joint/Cartilage Severe osteopenia. Comminuted  fracture of the lateral tibial plateau with 4 mm of depression and 7 mm of peripheral displacement. Nondisplaced fracture of the medial tibial metaphysis without articular surface involvement. No other fracture or dislocation. No aggressive osseous lesion. Mild medial femorotibial compartment joint space narrowing. Large lipohemarthrosis. Ligaments Ligaments are suboptimally evaluated by CT. Muscles and Tendons Muscles are normal. No muscle atrophy. Patellar tendon and quadriceps tendon are intact. Soft tissue No fluid collection or hematoma. No soft tissue mass. IMPRESSION: 1. Comminuted fracture of the lateral tibial plateau with 4 mm of depression and 7 mm of peripheral displacement. 2. Nondisplaced fracture of the medial tibial metaphysis without articular surface involvement. 3. Large lipohemarthrosis. Electronically Signed   By: Elige Ko   On: 09/05/2019 09:31    Procedures Procedures (including critical care time)   SPLINT APPLICATION Date/Time: 2:19 PM Authorized by: Vale Mousseau Consent: Verbal consent obtained. Risks and benefits: risks, benefits and alternatives were discussed Consent given by: patient Splint applied by: myself, nursing Location details: left leg Splint type: long leg splint Supplies used: Orthoglass, ACE wrap Post-procedure: The splinted body part was neurovascularly unchanged following the procedure. Patient tolerance: Patient tolerated the procedure well with no immediate complications.    Medications Ordered in ED Medications - No data to display  ED Course  I have reviewed the triage vital signs and the nursing notes.  Pertinent labs & imaging results that were available during my care of the patient were reviewed by me and considered in my medical decision making (see chart for details).    MDM Rules/Calculators/A&P                      1200  Consulted orthopedics, Dr. Everardo Pacific and discussed findings.  He recommends long leg splint and office  f/u in 2 weeks.  It was felt that pt would not be a surgical candidate.    Long-leg splint applied, extremity remains neurovascularly intact.  Pain improved.   1330  Spoke with Soledad Gerlach at [facility] and discussed care plan and need for orthopedic f/u.  Final Clinical Impression(s) / ED Diagnoses Final diagnoses:  Closed fracture of left tibial plateau, initial encounter    Rx / DC Orders ED Discharge Orders    None       Kem Parkinson, PA-C 09/05/19 1420    Nat Christen, MD 09/10/19 1015

## 2019-09-05 NOTE — ED Triage Notes (Addendum)
Pt presents with left knee pain "for a couple days". Denies fall or trauma to area. Swelling and bruising to area noted.   Xray report brought in with pt from [facility] stating pt has left tibial fracture.   Nurse at [facility] states fracture presumed to happen during exorcizes with physical therapy.  Per facility, pt sent to [facility] for CT scan of left leg.

## 2019-09-05 NOTE — ED Notes (Signed)
Attempted to give report to [facility]. No response at this time.

## 2020-06-12 IMAGING — CT CT KNEE*L* W/O CM
3 series · 15 of 33 positions shown, 18 images · non-contrast
Comparison: None.

CLINICAL DATA: Left tibial plateau fracture

EXAM:
CT OF THE LEFT KNEE WITHOUT CONTRAST
TECHNIQUE: Multidetector CT imaging of the LEFT knee was performed according to
the standard protocol. Multiplanar CT image reconstructions were
also generated.

[Series 7: axial st · axial · 0.30mm/px · z∈[+568,+709]mm · 7 of 167 slices shown, 9 images]
[im 13/167  soft-tissue]
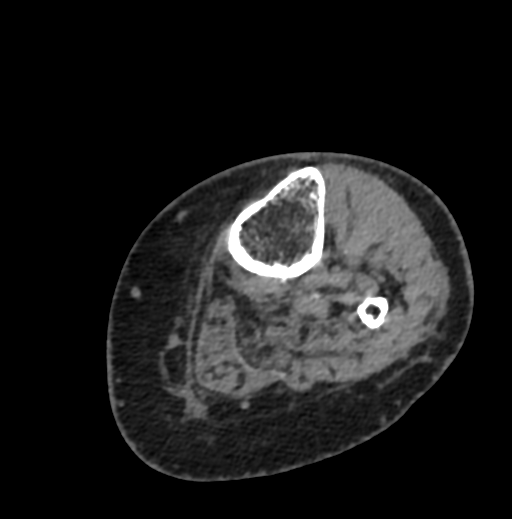
[im 13/167  bone]
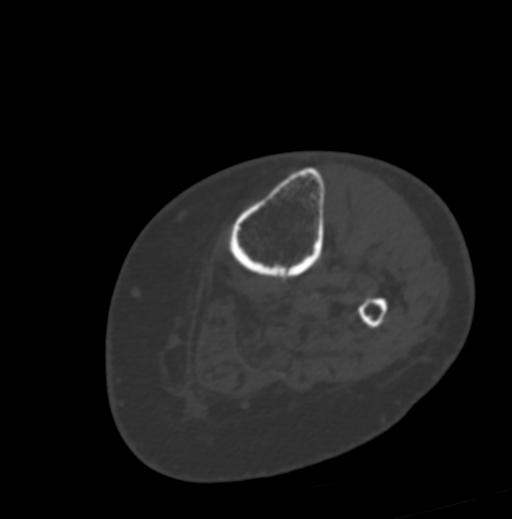
[im 39/167  bone]
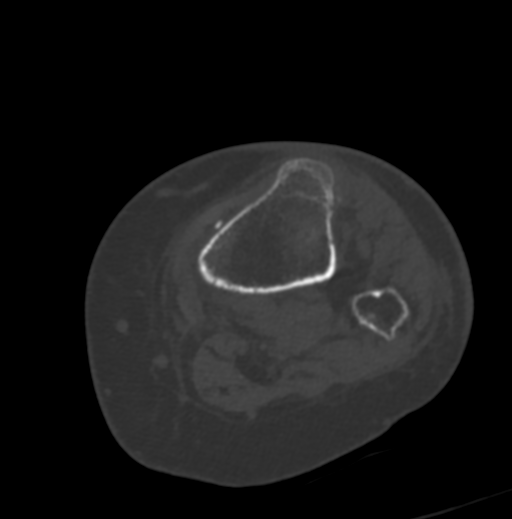
[im 64/167  bone]
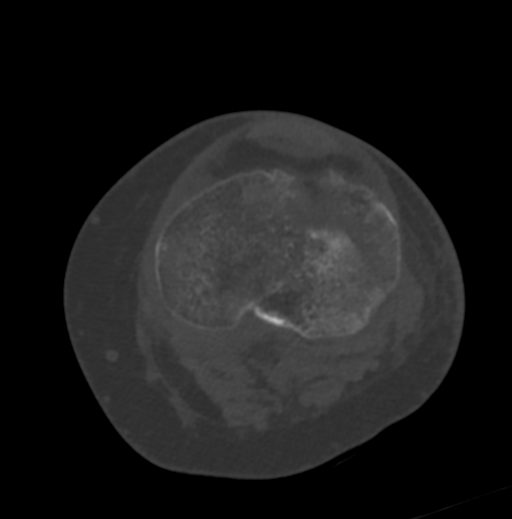
[im 90/167  bone]
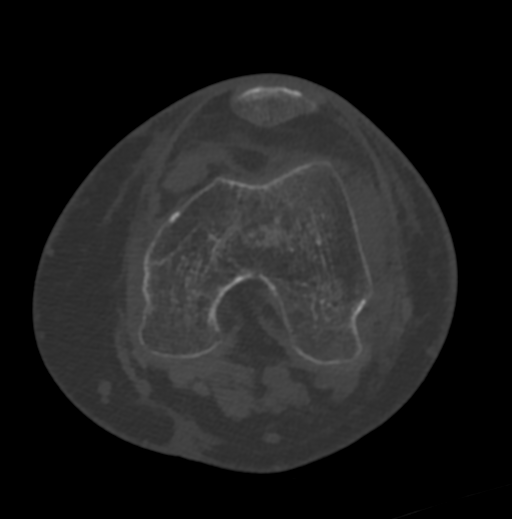
[im 103/167  soft-tissue]
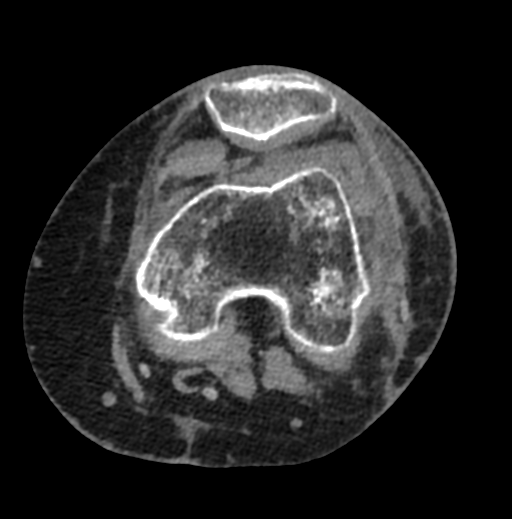
[im 103/167  bone]
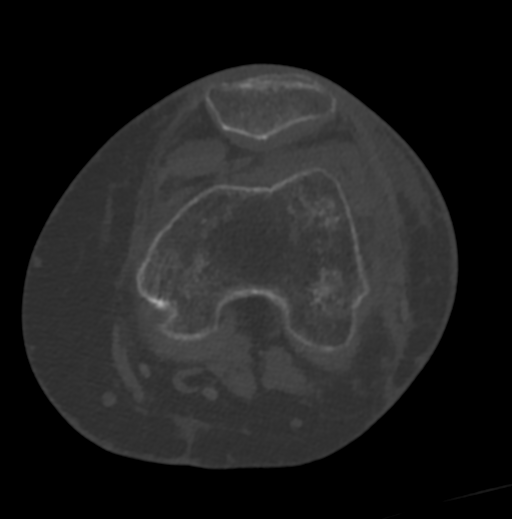
[im 128/167  bone]
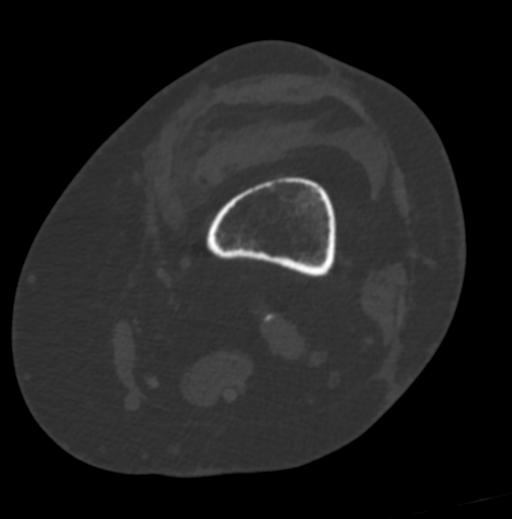
[im 154/167  bone]
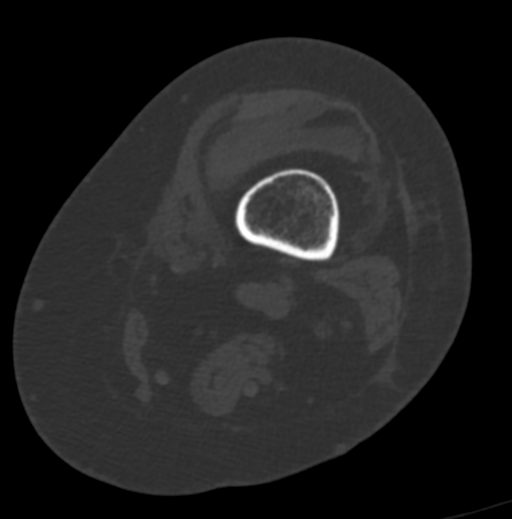

[Series 8: cor st · coronal · 0.33mm/px · 3 of 147 slices shown]
[im 30/147  bone]
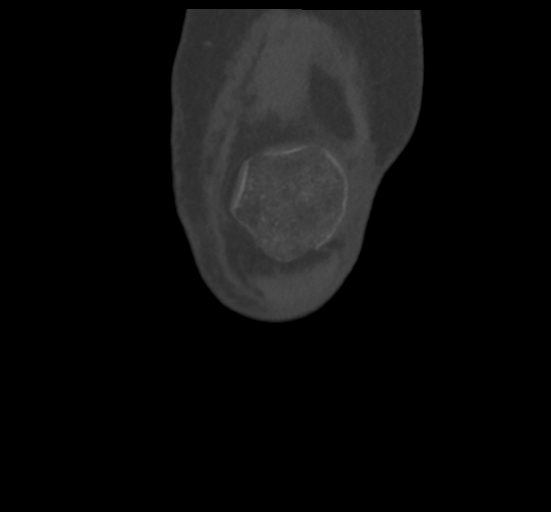
[im 59/147  bone]
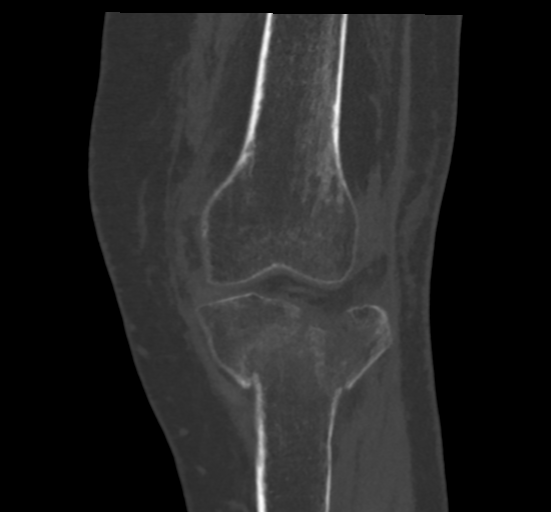
[im 88/147  bone]
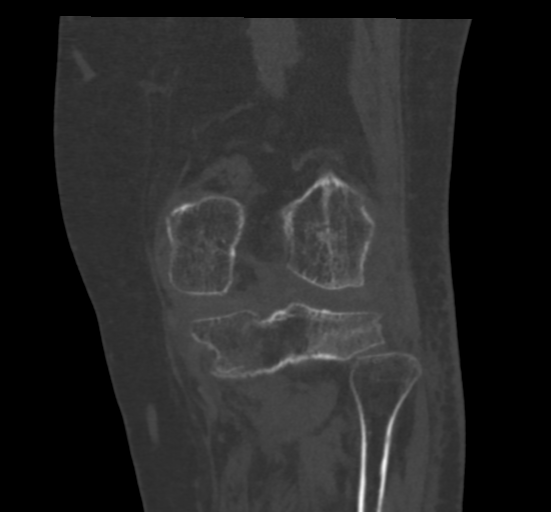

[Series 9: sag st · sagittal · 0.31mm/px · 5 of 145 slices shown, 6 images]
[im 49/145  bone]
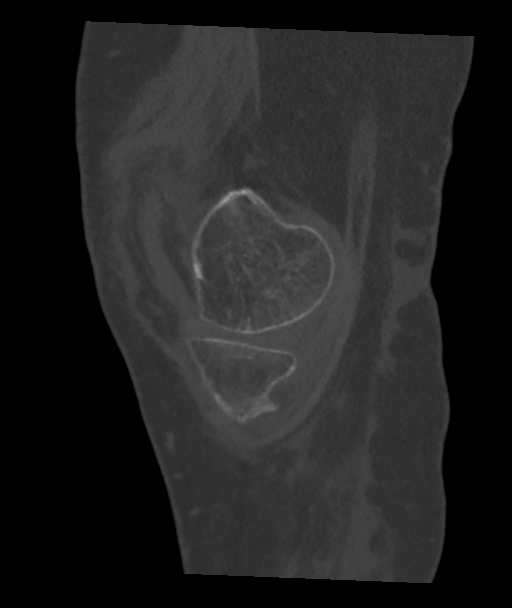
[im 61/145  bone]
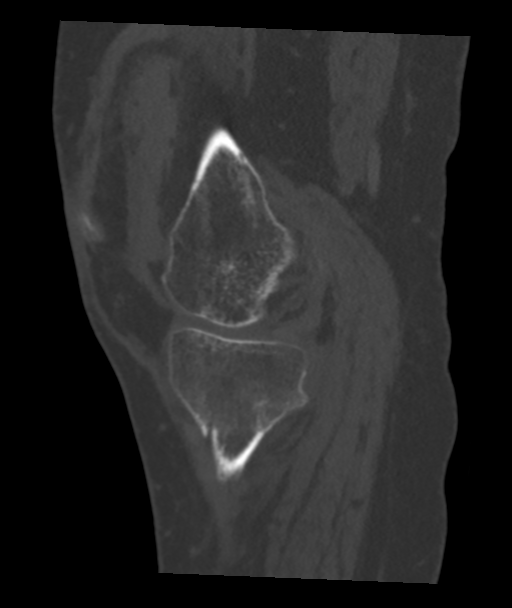
[im 73/145  soft-tissue]
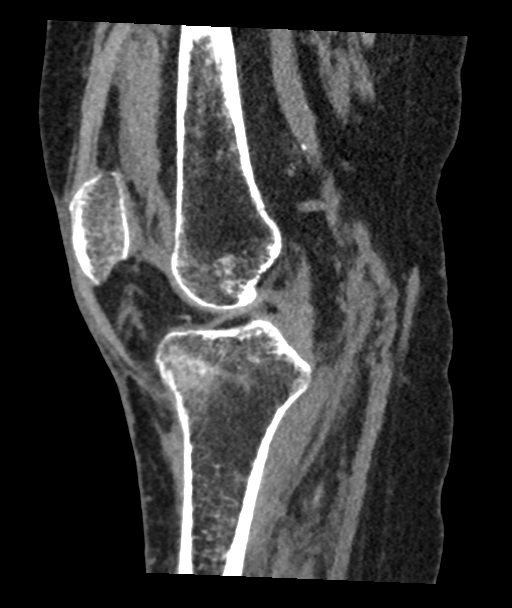
[im 73/145  bone]
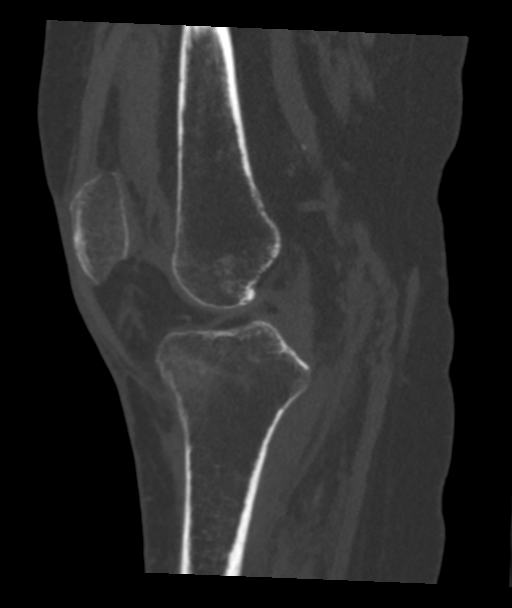
[im 85/145  bone]
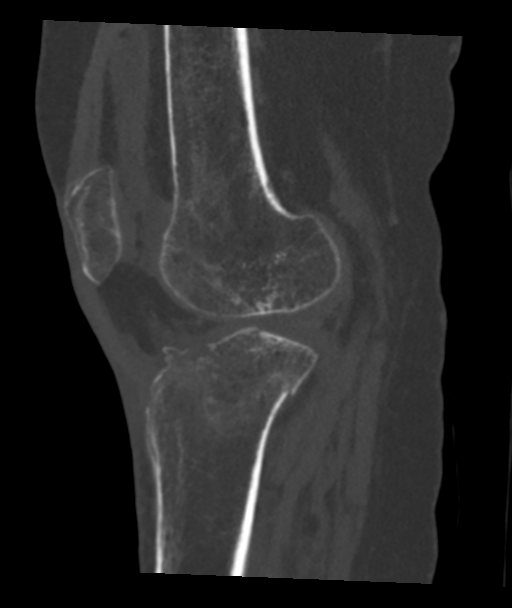
[im 97/145  bone]
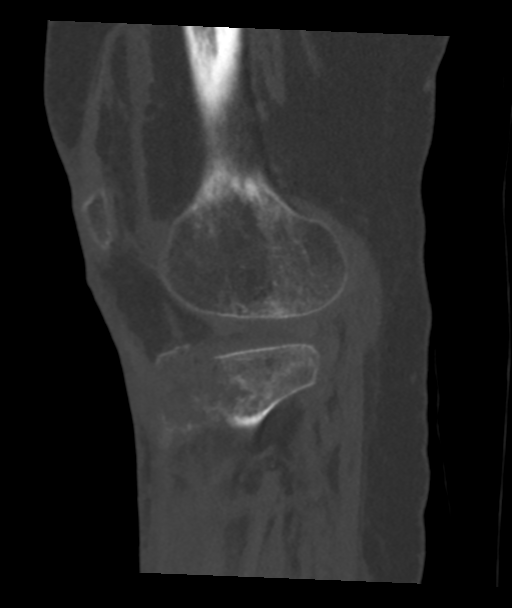

[15 of 33 positions shown; findings below may reference images not displayed]

FINDINGS: Bones/Joint/Cartilage

Severe osteopenia.

Comminuted fracture of the lateral tibial plateau with 4 mm of
depression and 7 mm of peripheral displacement.

Nondisplaced fracture of the medial tibial metaphysis without
articular surface involvement.

No other fracture or dislocation. No aggressive osseous lesion. Mild
medial femorotibial compartment joint space narrowing. Large
lipohemarthrosis.

Ligaments

Ligaments are suboptimally evaluated by CT.

Muscles and Tendons
Muscles are normal. No muscle atrophy. Patellar tendon and
quadriceps tendon are intact.

Soft tissue
No fluid collection or hematoma. No soft tissue mass.
IMPRESSION: 1. Comminuted fracture of the lateral tibial plateau with 4 mm of
depression and 7 mm of peripheral displacement.
2. Nondisplaced fracture of the medial tibial metaphysis without
articular surface involvement.
3. Large lipohemarthrosis.

## 2022-05-23 ENCOUNTER — Emergency Department (HOSPITAL_COMMUNITY): Payer: Medicare Other

## 2022-05-23 ENCOUNTER — Encounter (HOSPITAL_COMMUNITY): Payer: Self-pay

## 2022-05-23 ENCOUNTER — Inpatient Hospital Stay (HOSPITAL_COMMUNITY)
Admission: EM | Admit: 2022-05-23 | Discharge: 2022-05-25 | DRG: 101 | Disposition: A | Discharge status: Skilled Nursing Facility | Payer: Medicare Other | Source: Skilled Nursing Facility | Attending: Family Medicine | Admitting: Family Medicine

## 2022-05-23 DIAGNOSIS — N390 Urinary tract infection, site not specified: Secondary | ICD-10-CM | POA: Diagnosis present

## 2022-05-23 DIAGNOSIS — E782 Mixed hyperlipidemia: Secondary | ICD-10-CM | POA: Diagnosis present

## 2022-05-23 DIAGNOSIS — R739 Hyperglycemia, unspecified: Secondary | ICD-10-CM | POA: Diagnosis present

## 2022-05-23 DIAGNOSIS — Z83719 Family history of colon polyps, unspecified: Secondary | ICD-10-CM

## 2022-05-23 DIAGNOSIS — R4182 Altered mental status, unspecified: Secondary | ICD-10-CM

## 2022-05-23 DIAGNOSIS — Z8673 Personal history of transient ischemic attack (TIA), and cerebral infarction without residual deficits: Secondary | ICD-10-CM

## 2022-05-23 DIAGNOSIS — N3000 Acute cystitis without hematuria: Secondary | ICD-10-CM | POA: Diagnosis present

## 2022-05-23 DIAGNOSIS — D72829 Elevated white blood cell count, unspecified: Secondary | ICD-10-CM | POA: Insufficient documentation

## 2022-05-23 DIAGNOSIS — G40909 Epilepsy, unspecified, not intractable, without status epilepticus: Secondary | ICD-10-CM | POA: Diagnosis not present

## 2022-05-23 DIAGNOSIS — Z800 Family history of malignant neoplasm of digestive organs: Secondary | ICD-10-CM

## 2022-05-23 DIAGNOSIS — I69354 Hemiplegia and hemiparesis following cerebral infarction affecting left non-dominant side: Secondary | ICD-10-CM

## 2022-05-23 DIAGNOSIS — R569 Unspecified convulsions: Principal | ICD-10-CM

## 2022-05-23 DIAGNOSIS — M170 Bilateral primary osteoarthritis of knee: Secondary | ICD-10-CM | POA: Diagnosis present

## 2022-05-23 DIAGNOSIS — Z7982 Long term (current) use of aspirin: Secondary | ICD-10-CM

## 2022-05-23 DIAGNOSIS — Z79899 Other long term (current) drug therapy: Secondary | ICD-10-CM

## 2022-05-23 DIAGNOSIS — I10 Essential (primary) hypertension: Secondary | ICD-10-CM | POA: Diagnosis present

## 2022-05-23 DIAGNOSIS — Z8249 Family history of ischemic heart disease and other diseases of the circulatory system: Secondary | ICD-10-CM

## 2022-05-23 DIAGNOSIS — I69320 Aphasia following cerebral infarction: Secondary | ICD-10-CM

## 2022-05-23 LAB — URINALYSIS, ROUTINE W REFLEX MICROSCOPIC
Bilirubin Urine: NEGATIVE
Glucose, UA: NEGATIVE mg/dL
Ketones, ur: NEGATIVE mg/dL
Nitrite: POSITIVE — AB
Protein, ur: 30 mg/dL — AB
Specific Gravity, Urine: 1.016 (ref 1.005–1.030)
WBC, UA: >50 WBC/hpf — ABNORMAL HIGH (ref 0–5)
pH: 5 (ref 5.0–8.0)

## 2022-05-23 LAB — CBC WITH DIFFERENTIAL/PLATELET
Abs Immature Granulocytes: 0.09 10*3/uL — ABNORMAL HIGH (ref 0.00–0.07)
Basophils Absolute: 0 10*3/uL (ref 0.0–0.1)
Basophils Relative: 0 %
Eosinophils Absolute: 0 10*3/uL (ref 0.0–0.5)
Eosinophils Relative: 0 %
HCT: 41.4 % (ref 36.0–46.0)
Hemoglobin: 12.7 g/dL (ref 12.0–15.0)
Immature Granulocytes: 1 %
Lymphocytes Relative: 8 %
Lymphs Abs: 1.1 10*3/uL (ref 0.7–4.0)
MCH: 28 pg (ref 26.0–34.0)
MCHC: 30.7 g/dL (ref 30.0–36.0)
MCV: 91.4 fL (ref 80.0–100.0)
Monocytes Absolute: 0.5 10*3/uL (ref 0.1–1.0)
Monocytes Relative: 4 %
Neutro Abs: 11.5 10*3/uL — ABNORMAL HIGH (ref 1.7–7.7)
Neutrophils Relative %: 87 %
Platelets: 280 10*3/uL (ref 150–400)
RBC: 4.53 MIL/uL (ref 3.87–5.11)
RDW: 15.3 % (ref 11.5–15.5)
WBC: 13.2 10*3/uL — ABNORMAL HIGH (ref 4.0–10.5)
nRBC: 0 % (ref 0.0–0.2)

## 2022-05-23 LAB — BASIC METABOLIC PANEL
Anion gap: 13 (ref 5–15)
BUN: 17 mg/dL (ref 8–23)
CO2: 24 mmol/L (ref 22–32)
Calcium: 9.5 mg/dL (ref 8.9–10.3)
Chloride: 103 mmol/L (ref 98–111)
Creatinine, Ser: 0.86 mg/dL (ref 0.44–1.00)
GFR, Estimated: >60 mL/min (ref >60)
Glucose, Bld: 155 mg/dL — ABNORMAL HIGH (ref 70–99)
Potassium: 3.7 mmol/L (ref 3.5–5.1)
Sodium: 140 mmol/L (ref 135–145)

## 2022-05-23 LAB — MAGNESIUM: Magnesium: 2.2 mg/dL (ref 1.7–2.4)

## 2022-05-23 LAB — VALPROIC ACID LEVEL: Valproic Acid Lvl: <10 ug/mL — ABNORMAL LOW (ref 50.0–100.0)

## 2022-05-23 MED ORDER — SODIUM CHLORIDE 0.9 % IV SOLN: INTRAVENOUS | Status: DC

## 2022-05-23 MED ORDER — SODIUM CHLORIDE 0.9 % IV BOLUS
500.0000 mL | Freq: Once | INTRAVENOUS | Status: AC
  Administered 2022-05-23: 500 mL via INTRAVENOUS

## 2022-05-23 NOTE — ED Provider Notes (Incomplete)
[facility] EMERGENCY DEPARTMENT Provider Note   CSN: [id number] Arrival date & time: 05/23/22  6734     History {Add pertinent medical, surgical, social history, OB history to HPI:1} Chief Complaint  Patient presents with   Seizures    Lisa [name] is a 66 y.o. female.  HPI     Home Medications Prior to Admission medications   Medication Sig Start Date End Date Taking? Authorizing Provider  acetaminophen (TYLENOL) 325 MG tablet Take by mouth every 6 (six) hours as needed for mild pain.    [provider]  amLODipine (NORVASC) 10 MG tablet Take 1 tablet (10 mg total) by mouth daily. 04/13/18   Catarina Hartshorn, MD  aspirin 81 MG chewable tablet Chew 1 tablet (81 mg total) by mouth daily. 05/10/18   Johnson, Clanford L, MD  calcium carbonate (TUMS - DOSED IN MG ELEMENTAL CALCIUM) 500 MG chewable tablet Chew 1 tablet by mouth daily.    [provider]  divalproex (DEPAKOTE) 250 MG DR tablet Take 750 mg by mouth 2 (two) times daily.     [provider]  famotidine (PEPCID) 20 MG tablet Take 20 mg by mouth daily.    [provider]  HYDROcodone-acetaminophen (NORCO/VICODIN) 5-325 MG tablet Take one tab po q 4 hrs prn pain 09/05/19   Triplett, Tammy, PA-C  hydroxypropyl methylcellulose / hypromellose (ISOPTO TEARS / GONIOVISC) 2.5 % ophthalmic solution Place 1 drop into both eyes 2 (two) times daily.    [provider]  ipratropium-albuterol (DUONEB) 0.5-2.5 (3) MG/3ML SOLN Take 3 mLs by nebulization every 6 (six) hours as needed (for shortness of breath-wheezing).    [provider]  levETIRAcetam (KEPPRA) 500 MG tablet Take 500 mg by mouth 2 (two) times daily.    [provider]  mirtazapine (REMERON) 15 MG tablet Take 15 mg by mouth at bedtime.    [provider]  polyethylene glycol (MIRALAX / GLYCOLAX) packet Take 17 g by mouth 2 (two) times daily. 04/13/18   Catarina Hartshorn, MD  potassium chloride SA (K-DUR,KLOR-CON)  10 MEQ tablet Take 1 tablet (10 mEq total) by mouth daily. 05/09/18   Johnson, Clanford L, MD  sertraline (ZOLOFT) 50 MG tablet Take 50 mg by mouth daily.    [provider]  Vitamin D, Ergocalciferol, (DRISDOL) 50000 units CAPS capsule Take 50,000 Units by mouth 2 (two) times a week. Tuesdays and Saturdays    [provider]      Allergies    Patient has no known allergies.    Review of Systems   Review of Systems  Physical Exam Updated Vital Signs BP 111/62 (BP Location: Right Arm)   Temp 97.8 F (36.6 C) (Oral)   Resp 20   SpO2 96%  Physical Exam  ED Results / Procedures / Treatments   Labs (all labs ordered are listed, but only abnormal results are displayed) Labs Reviewed  CBC WITH DIFFERENTIAL/PLATELET  MAGNESIUM  BASIC METABOLIC PANEL    EKG EKG Interpretation  Date/Time:  Tuesday May 23 2022 16:30:49 EST Ventricular Rate:  101 PR Interval:  151 QRS Duration: 94 QT Interval:  358 QTC Calculation: 464 R Axis:   6 Text Interpretation: Sinus tachycardia Low voltage, precordial leads Probable left ventricular hypertrophy Confirmed by Vanetta Mulders [phone]) on 05/23/2022 4:34:42 PM  Radiology No results found.  Procedures Procedures  {Document cardiac monitor, telemetry assessment procedure when appropriate:1}  Medications Ordered in ED Medications  0.9 %  sodium chloride infusion (has no administration  in time range)    ED Course/ Medical Decision Making/ A&P                           Medical Decision Making Amount and/or Complexity of Data Reviewed Labs: ordered.  Risk Prescription drug management.   ***  {Document critical care time when appropriate:1} {Document review of labs and clinical decision tools ie heart score, Chads2Vasc2 etc:1}  {Document your independent review of radiology images, and any outside records:1} {Document your discussion with family members, caretakers, and with consultants:1} {Document social  determinants of health affecting pt's care:1} {Document your decision making why or why not admission, treatments were needed:1} Final Clinical Impression(s) / ED Diagnoses Final diagnoses:  None    Rx / DC Orders ED Discharge Orders     None

## 2022-05-23 NOTE — ED Triage Notes (Signed)
Pt brought in by [facility] from [facility] for an unwitnessed seizure. Pt is currently postictal, responsive to painful stimuli.

## 2022-05-23 NOTE — ED Notes (Signed)
Call placed to facility, staff reports pt is normally oriented to self but confused at baseline, pt can normally speak in complete sentences and is talkative, pt is non-weight bearing and transferred with a hoyer lift, EDP aware

## 2022-05-23 NOTE — ED Notes (Signed)
Sister requesting an update if pt is being admitted or going back to nursing home.

## 2022-05-24 ENCOUNTER — Inpatient Hospital Stay (HOSPITAL_COMMUNITY)
Admit: 2022-05-24 | Discharge: 2022-05-24 | Disposition: A | Discharge status: Home / Self Care | Payer: Medicare Other | Attending: Internal Medicine | Admitting: Internal Medicine

## 2022-05-24 DIAGNOSIS — E782 Mixed hyperlipidemia: Secondary | ICD-10-CM | POA: Diagnosis present

## 2022-05-24 DIAGNOSIS — R404 Transient alteration of awareness: Secondary | ICD-10-CM

## 2022-05-24 DIAGNOSIS — R739 Hyperglycemia, unspecified: Secondary | ICD-10-CM

## 2022-05-24 DIAGNOSIS — Z7982 Long term (current) use of aspirin: Secondary | ICD-10-CM | POA: Diagnosis not present

## 2022-05-24 DIAGNOSIS — I10 Essential (primary) hypertension: Secondary | ICD-10-CM

## 2022-05-24 DIAGNOSIS — M170 Bilateral primary osteoarthritis of knee: Secondary | ICD-10-CM | POA: Diagnosis present

## 2022-05-24 DIAGNOSIS — G40909 Epilepsy, unspecified, not intractable, without status epilepticus: Secondary | ICD-10-CM | POA: Diagnosis present

## 2022-05-24 DIAGNOSIS — D72829 Elevated white blood cell count, unspecified: Secondary | ICD-10-CM

## 2022-05-24 DIAGNOSIS — R4182 Altered mental status, unspecified: Secondary | ICD-10-CM | POA: Diagnosis not present

## 2022-05-24 DIAGNOSIS — E876 Hypokalemia: Secondary | ICD-10-CM | POA: Diagnosis not present

## 2022-05-24 DIAGNOSIS — Z8249 Family history of ischemic heart disease and other diseases of the circulatory system: Secondary | ICD-10-CM | POA: Diagnosis not present

## 2022-05-24 DIAGNOSIS — Z79899 Other long term (current) drug therapy: Secondary | ICD-10-CM | POA: Diagnosis not present

## 2022-05-24 DIAGNOSIS — N3000 Acute cystitis without hematuria: Secondary | ICD-10-CM | POA: Diagnosis present

## 2022-05-24 DIAGNOSIS — I69354 Hemiplegia and hemiparesis following cerebral infarction affecting left non-dominant side: Secondary | ICD-10-CM | POA: Diagnosis not present

## 2022-05-24 DIAGNOSIS — G40919 Epilepsy, unspecified, intractable, without status epilepticus: Secondary | ICD-10-CM | POA: Diagnosis not present

## 2022-05-24 DIAGNOSIS — R569 Unspecified convulsions: Secondary | ICD-10-CM

## 2022-05-24 DIAGNOSIS — N390 Urinary tract infection, site not specified: Secondary | ICD-10-CM | POA: Diagnosis not present

## 2022-05-24 DIAGNOSIS — Z83719 Family history of colon polyps, unspecified: Secondary | ICD-10-CM | POA: Diagnosis not present

## 2022-05-24 DIAGNOSIS — I69320 Aphasia following cerebral infarction: Secondary | ICD-10-CM | POA: Diagnosis not present

## 2022-05-24 DIAGNOSIS — Z800 Family history of malignant neoplasm of digestive organs: Secondary | ICD-10-CM | POA: Diagnosis not present

## 2022-05-24 LAB — BASIC METABOLIC PANEL
Anion gap: 8 (ref 5–15)
BUN: 14 mg/dL (ref 8–23)
CO2: 23 mmol/L (ref 22–32)
Calcium: 8.7 mg/dL — ABNORMAL LOW (ref 8.9–10.3)
Chloride: 109 mmol/L (ref 98–111)
Creatinine, Ser: 0.76 mg/dL (ref 0.44–1.00)
GFR, Estimated: >60 mL/min (ref >60)
Glucose, Bld: 133 mg/dL — ABNORMAL HIGH (ref 70–99)
Potassium: 3.4 mmol/L — ABNORMAL LOW (ref 3.5–5.1)
Sodium: 140 mmol/L (ref 135–145)

## 2022-05-24 LAB — CBC WITH DIFFERENTIAL/PLATELET
Abs Immature Granulocytes: 0.04 10*3/uL (ref 0.00–0.07)
Basophils Absolute: 0 10*3/uL (ref 0.0–0.1)
Basophils Relative: 0 %
Eosinophils Absolute: 0.2 10*3/uL (ref 0.0–0.5)
Eosinophils Relative: 2 %
HCT: 36.8 % (ref 36.0–46.0)
Hemoglobin: 11.3 g/dL — ABNORMAL LOW (ref 12.0–15.0)
Immature Granulocytes: 0 %
Lymphocytes Relative: 20 %
Lymphs Abs: 2 10*3/uL (ref 0.7–4.0)
MCH: 28 pg (ref 26.0–34.0)
MCHC: 30.7 g/dL (ref 30.0–36.0)
MCV: 91.3 fL (ref 80.0–100.0)
Monocytes Absolute: 0.6 10*3/uL (ref 0.1–1.0)
Monocytes Relative: 6 %
Neutro Abs: 7.1 10*3/uL (ref 1.7–7.7)
Neutrophils Relative %: 72 %
Platelets: 281 10*3/uL (ref 150–400)
RBC: 4.03 MIL/uL (ref 3.87–5.11)
RDW: 15.5 % (ref 11.5–15.5)
WBC: 10 10*3/uL (ref 4.0–10.5)
nRBC: 0 % (ref 0.0–0.2)

## 2022-05-24 LAB — LACTIC ACID, PLASMA
Lactic Acid, Venous: 2.6 mmol/L (ref 0.5–1.9)
Lactic Acid, Venous: 1.7 mmol/L (ref 0.5–1.9)

## 2022-05-24 LAB — MRSA NEXT GEN BY PCR, NASAL: MRSA by PCR Next Gen: NOT DETECTED

## 2022-05-24 LAB — HIV ANTIBODY (ROUTINE TESTING W REFLEX): HIV Screen 4th Generation wRfx: NONREACTIVE

## 2022-05-24 MED ORDER — LACOSAMIDE 50 MG PO TABS: 200.0000 mg | ORAL_TABLET | Freq: Two times a day (BID) | ORAL | Status: DC

## 2022-05-24 MED ORDER — ACETAMINOPHEN 325 MG PO TABS: 650.0000 mg | ORAL_TABLET | Freq: Four times a day (QID) | ORAL | Status: DC | PRN

## 2022-05-24 MED ORDER — LEVETIRACETAM 500 MG PO TABS: 1000.0000 mg | ORAL_TABLET | Freq: Two times a day (BID) | ORAL | Status: DC

## 2022-05-24 MED ORDER — SODIUM CHLORIDE 0.9 % IV SOLN
2.0000 g | INTRAVENOUS | Status: DC
  Administered 2022-05-25: 2 g via INTRAVENOUS
  Filled 2022-05-24: qty 20

## 2022-05-24 MED ORDER — ONDANSETRON HCL 4 MG PO TABS: 4.0000 mg | ORAL_TABLET | Freq: Four times a day (QID) | ORAL | Status: DC | PRN

## 2022-05-24 MED ORDER — LACOSAMIDE 200 MG/20ML IV SOLN
INTRAVENOUS | Status: AC
  Filled 2022-05-24: qty 20

## 2022-05-24 MED ORDER — ONDANSETRON HCL 4 MG/2ML IJ SOLN: 4.0000 mg | Freq: Four times a day (QID) | INTRAMUSCULAR | Status: DC | PRN

## 2022-05-24 MED ORDER — SODIUM CHLORIDE 0.9 % IV SOLN
1.0000 g | Freq: Once | INTRAVENOUS | Status: AC
  Administered 2022-05-24: 1 g via INTRAVENOUS
  Filled 2022-05-24: qty 10

## 2022-05-24 MED ORDER — SODIUM CHLORIDE 0.9 % IV SOLN
200.0000 mg | Freq: Two times a day (BID) | INTRAVENOUS | Status: DC
  Administered 2022-05-24 (×2): 200 mg via INTRAVENOUS
  Filled 2022-05-24 (×5): qty 20

## 2022-05-24 MED ORDER — ASPIRIN 81 MG PO CHEW
81.0000 mg | CHEWABLE_TABLET | Freq: Every day | ORAL | Status: DC
  Administered 2022-05-25: 81 mg via ORAL
  Filled 2022-05-24: qty 1

## 2022-05-24 MED ORDER — LEVETIRACETAM IN NACL 1000 MG/100ML IV SOLN
1000.0000 mg | Freq: Two times a day (BID) | INTRAVENOUS | Status: DC
  Administered 2022-05-24 (×2): 1000 mg via INTRAVENOUS
  Filled 2022-05-24 (×3): qty 100

## 2022-05-24 MED ORDER — ENOXAPARIN SODIUM 40 MG/0.4ML IJ SOSY
40.0000 mg | PREFILLED_SYRINGE | INTRAMUSCULAR | Status: DC
  Administered 2022-05-24 – 2022-05-25 (×2): 40 mg via SUBCUTANEOUS
  Filled 2022-05-24 (×2): qty 0.4

## 2022-05-24 MED ORDER — ATORVASTATIN CALCIUM 20 MG PO TABS
20.0000 mg | ORAL_TABLET | Freq: Every evening | ORAL | Status: DC
  Filled 2022-05-24: qty 1

## 2022-05-24 MED ORDER — ACETAMINOPHEN 650 MG RE SUPP: 650.0000 mg | Freq: Four times a day (QID) | RECTAL | Status: DC | PRN

## 2022-05-24 MED ORDER — CIPROFLOXACIN IN D5W 400 MG/200ML IV SOLN
400.0000 mg | Freq: Two times a day (BID) | INTRAVENOUS | Status: DC
  Administered 2022-05-24: 400 mg via INTRAVENOUS
  Filled 2022-05-24: qty 200

## 2022-05-24 NOTE — Progress Notes (Signed)
ASSUMPTION OF CARE NOTE   05/24/2022 12:47 PM  Lisa [name] was seen and examined.  The H&P by the admitting provider, orders, imaging was reviewed.  Please see new orders.  Will continue to follow.   Vitals:   05/24/22 0617 05/24/22 1100  BP: 136/81 (!) 143/81  Pulse: 93 82  Resp: 18 16  Temp: 97.8 F (36.6 C) 98 F (36.7 C)  SpO2: 97%     Results for orders placed or performed during the hospital encounter of 05/23/22  Culture, blood (Routine X 2) w Reflex to ID Panel   Specimen: BLOOD LEFT HAND  Result Value Ref Range   Specimen Description BLOOD LEFT HAND    Special Requests      BOTTLES DRAWN AEROBIC AND ANAEROBIC Blood Culture results may not be optimal due to an inadequate volume of blood received in culture bottles Performed at [facility], [address] [place], [place] [address]    Culture PENDING    Report Status PENDING   Culture, blood (Routine X 2) w Reflex to ID Panel   Specimen: BLOOD RIGHT HAND  Result Value Ref Range   Specimen Description BLOOD RIGHT HAND    Special Requests      BOTTLES DRAWN AEROBIC AND ANAEROBIC Blood Culture results may not be optimal due to an inadequate volume of blood received in culture bottles Performed at [facility], [address], [place], [place] [address]    Culture PENDING    Report Status PENDING   MRSA Next Gen by PCR, Nasal   Specimen: Nasal Mucosa; Nasal Swab  Result Value Ref Range   MRSA by PCR Next Gen NOT DETECTED NOT DETECTED  CBC with Differential/Platelet  Result Value Ref Range   WBC 13.2 (H) 4.0 - 10.5 K/uL   RBC 4.53 3.87 - 5.11 MIL/uL   Hemoglobin 12.7 12.0 - 15.0 g/dL   HCT 03.5 00.9 - 38.1 %   MCV 91.4 80.0 - 100.0 fL   MCH 28.0 26.0 - 34.0 pg   MCHC 30.7 30.0 - 36.0 g/dL   RDW 82.9 93.7 - 16.9 %   Platelets 280 150 - 400 K/uL   nRBC 0.0 0.0 - 0.2 %   Neutrophils Relative % 87 %   Neutro Abs 11.5 (H) 1.7 - 7.7 K/uL   Lymphocytes Relative 8 %   Lymphs Abs 1.1 0.7 - 4.0 K/uL    Monocytes Relative 4 %   Monocytes Absolute 0.5 0.1 - 1.0 K/uL   Eosinophils Relative 0 %   Eosinophils Absolute 0.0 0.0 - 0.5 K/uL   Basophils Relative 0 %   Basophils Absolute 0.0 0.0 - 0.1 K/uL   Immature Granulocytes 1 %   Abs Immature Granulocytes 0.09 (H) 0.00 - 0.07 K/uL  Magnesium  Result Value Ref Range   Magnesium 2.2 1.7 - 2.4 mg/dL  Basic metabolic panel  Result Value Ref Range   Sodium 140 135 - 145 mmol/L   Potassium 3.7 3.5 - 5.1 mmol/L   Chloride 103 98 - 111 mmol/L   CO2 24 22 - 32 mmol/L   Glucose, Bld 155 (H) 70 - 99 mg/dL   BUN 17 8 - 23 mg/dL   Creatinine, Ser 6.78 0.44 - 1.00 mg/dL   Calcium 9.5 8.9 - 93.8 mg/dL   GFR, Estimated >10 >17 mL/min   Anion gap 13 5 - 15  Valproic acid level  Result Value Ref Range   Valproic Acid Lvl <10 (L) 50.0 - 100.0 ug/mL  Urinalysis, Routine w reflex microscopic Urine, In & Out Cath  Result Value Ref Range   Color, Urine YELLOW YELLOW   APPearance CLOUDY (A) CLEAR   Specific Gravity, Urine 1.016 1.005 - 1.030   pH 5.0 5.0 - 8.0   Glucose, UA NEGATIVE NEGATIVE mg/dL   Hgb urine dipstick MODERATE (A) NEGATIVE   Bilirubin Urine NEGATIVE NEGATIVE   Ketones, ur NEGATIVE NEGATIVE mg/dL   Protein, ur 30 (A) NEGATIVE mg/dL   Nitrite POSITIVE (A) NEGATIVE   Leukocytes,Ua LARGE (A) NEGATIVE   RBC / HPF 11-20 0 - 5 RBC/hpf   WBC, UA >50 (H) 0 - 5 WBC/hpf   Bacteria, UA RARE (A) NONE SEEN   WBC Clumps PRESENT   Lactic acid, plasma  Result Value Ref Range   Lactic Acid, Venous 2.6 (HH) 0.5 - 1.9 mmol/L  Lactic acid, plasma  Result Value Ref Range   Lactic Acid, Venous 1.7 0.5 - 1.9 mmol/L  HIV Antibody (routine testing w rflx)  Result Value Ref Range   HIV Screen 4th Generation wRfx Non Reactive Non Reactive  CBC with Differential/Platelet  Result Value Ref Range   WBC 10.0 4.0 - 10.5 K/uL   RBC 4.03 3.87 - 5.11 MIL/uL   Hemoglobin 11.3 (L) 12.0 - 15.0 g/dL   HCT 80.3 21.2 - 24.8 %   MCV 91.3 80.0 - 100.0 fL   MCH  28.0 26.0 - 34.0 pg   MCHC 30.7 30.0 - 36.0 g/dL   RDW 25.0 03.7 - 04.8 %   Platelets 281 150 - 400 K/uL   nRBC 0.0 0.0 - 0.2 %   Neutrophils Relative % 72 %   Neutro Abs 7.1 1.7 - 7.7 K/uL   Lymphocytes Relative 20 %   Lymphs Abs 2.0 0.7 - 4.0 K/uL   Monocytes Relative 6 %   Monocytes Absolute 0.6 0.1 - 1.0 K/uL   Eosinophils Relative 2 %   Eosinophils Absolute 0.2 0.0 - 0.5 K/uL   Basophils Relative 0 %   Basophils Absolute 0.0 0.0 - 0.1 K/uL   Immature Granulocytes 0 %   Abs Immature Granulocytes 0.04 0.00 - 0.07 K/uL     C. Laural Benes, MD [facility]   05/23/2022  4:15 PM How to contact the [facility] Attending or Consulting provider 7A - 7P or covering provider during after hours 7P -7A, for this patient?  Check the care team in [facility] and look for a) attending/consulting [facility] provider listed and b) the [facility] team listed Log into [email or web address] and use [place]'s universal password to access. If you do not have the password, please contact the hospital operator. Locate the [facility] provider you are looking for under [facility] and page to a number that you can be directly reached. If you still have difficulty reaching the provider, please page the [facility] (Director on Call) for the Hospitalists listed on amion for assistance.

## 2022-05-24 NOTE — Procedures (Signed)
Patient Name: Lisa [name]  MRN: [id number]  Epilepsy Attending: Charlsie Quest  Referring Physician/Provider: Frankey Shown, DO  Date: 05/24/2022 Duration: 30 mins  Patient history: 66 year old female with medical comorbidities as noted in HPI who presented with altered mental status and likely breakthrough seizure in the setting of UTI. EEG to evaluate for seizure  Level of alertness: Awake, asleep  AEDs during EEG study: LEV, LCM  Technical aspects: This EEG study was done with scalp electrodes positioned according to the 10-20 International system of electrode placement. Electrical activity was reviewed with band pass filter of 1-70Hz , sensitivity of 7 uV/mm, display speed of 44mm/sec with a 60Hz  notched filter applied as appropriate. EEG data were recorded continuously and digitally stored.  Video monitoring was available and reviewed as appropriate.  Description: No clear posterior dominant rhythm was seen. Sleep was characterized by vertex waves, sleep spindles (12 to 14 Hz), maximal frontocentral region. EEG showed continuous generalized and lateralized right hemisphere 3 to 6 Hz theta-delta slowing. Spikes were noted in right temporal region. Hyperventilation and photic stimulation were not performed.     ABNORMALITY - Spike, right temporal region - Continuous slow, generalized and lateralized right hemisphere   IMPRESSION: This study showed evidence of epileptogenicity arising from right temporal region. There is also cortical dysfunction arising from right hemisphere, likely secondary to underlying stroke. Additionally there is moderate diffuse encephalopathy, nonspecific etiology. No seizures were seen throughout the recording.  Mcclain Shall 

## 2022-05-24 NOTE — Consult Note (Signed)
I connected with  Lisa [name] on 05/24/22 by a video enabled telemedicine application and verified with RN that I am speaking with the correct person using two identifiers.   I discussed the limitations of evaluation and management by telemedicine. The patient was altered and unable to provide consent.  [facility] Location of physician: [facility]   Neurology Consultation Reason for Consult: seizure Referring Physician: Dr. Standley Dakins  CC: Altered mental status  History is obtained from: Chart review as patient is altered  HPI: Lisa [name] is a 66 y.o. 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  I was able to call patient's SNF at [facility].  Unfortunately the staff who was taking care of her yesterday was not available.  However the staff today was able to review the records and states that when the staff yesterday went in the room, patient was altered, would not wake up to sternal rub but not following commands.  There was concern that she had an unwitnessed seizure and was currently postictal.  Therefore she was sent to [facility] ED.  While in the ED, patient was diagnosed with UTI and started on IV ceftriaxone.  Home AEDs: Keppra 1000 mg twice daily, Vimpat 200 mg twice daily.  ROS: Unable to obtain due to altered mental status.   Past Medical History:  Diagnosis Date   Abnormal posture    Adjustment disorder    Anemia, unspecified    Aphasia-angular gyrus syndrome    Arthritis, climacteric, knee, left    Atypical psychosis (HCC)    C. 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 She has never used smokeless tobacco. She reports that she does not drink alcohol and does not use drugs.   Medications Prior to Admission  Medication Sig Dispense Refill Last Dose   aspirin 81 MG chewable tablet Chew 1 tablet (81 mg total) by mouth daily.   05/22/2022   atorvastatin (LIPITOR) 20 MG tablet Take 20 mg by mouth daily.   05/22/2022   Cholecalciferol (VITAMIN D3) 50 MCG (2000 UT) TABS Take 1 tablet by mouth daily. Every Thursday   05/18/2022   lacosamide (VIMPAT) 200 MG TABS tablet Take 200 mg by mouth 2 (two) times daily.   05/23/2022   levETIRAcetam (KEPPRA) 500 MG tablet Take 1,000 mg by mouth 2 (two) times daily.   05/23/2022   polyethylene glycol (MIRALAX / GLYCOLAX) packet Take 17 g by mouth 2 (  two) times daily. 14 each 0 unknown   Propylene Glycol-Glycerin ([employer] ARTIFICIAL TEARS OP) Apply to eye.   unknown   HYDROcodone-acetaminophen (NORCO/VICODIN) 5-325 MG tablet Take one tab po q 4 hrs prn pain (Patient not taking: Reported on 05/23/2022) 15 tablet 0 Not Taking      Exam: Current vital signs: BP 136/81 (BP Location: Right Arm)   Pulse 93   Temp 97.8 F (36.6 C)   Resp 18   Wt 66.8 kg   SpO2 97%   BMI 25.28 kg/m  Vital signs in last 24 hours: Temp:  [97.8 F (36.6 C)-97.9 F (36.6 C)] 97.8 F (36.6 C) (11/22 0617) Pulse Rate:  [91-110] 93 (11/22 0617) Resp:  [13-26] 18 (11/22 0617) BP:  (98-136)/(59-99) 136/81 (11/22 0617) SpO2:  [95 %-98 %] 97 % (11/22 0617) Weight:  [66.8 kg] 66.8 kg (11/22 0155)   Physical Exam  Constitutional: Laying in bed, not in apparent distress Neuro: Barely opens eyes to noxious stimuli, does not look at examiner and follow any commands, left eye deviated upwards, pupils equally round and reactive, increased tone in left upper and lower extremity (unclear if this is baseline from stroke), withdraws to noxious in right upper and right lower extremity  I have reviewed labs in epic and the results pertinent to this consultation are: CBC:  Recent Labs  Lab 05/23/22 1745 05/24/22 1020  WBC 13.2* 10.0  NEUTROABS 11.5* 7.1  HGB 12.7 11.3*  HCT 41.4 36.8  MCV 91.4 91.3  PLT 280 281    Basic Metabolic Panel:  Lab Results  Component Value Date   NA 140 05/23/2022   K 3.7 05/23/2022   CO2 24 05/23/2022   GLUCOSE 155 (H) 05/23/2022   BUN 17 05/23/2022   CREATININE 0.86 05/23/2022   CALCIUM 9.5 05/23/2022   GFRNONAA >60 05/23/2022   GFRAA >60 05/09/2018   Lipid Panel: No results found for: "LDLCALC" HgbA1c: No results found for: "HGBA1C" Urine Drug Screen: No results found for: "LABOPIA", "COCAINSCRNUR", "LABBENZ", "AMPHETMU", "THCU", "LABBARB"  Alcohol Level No results found for: "ETH"  I have reviewed the images obtained:  CT Head without contrast 05/23/2022: Similar-appearing right frontoparietal encephalomalacia. No acute intracranial abnormality.   ASSESSMENT/PLAN: 66 year old female with medical comorbidities as noted in HPI who presented with altered mental status and likely breakthrough seizure in the setting of UTI.  Epilepsy with likely breakthrough seizure UTI -Breakthrough seizure likely due to UTI  Recommendations: -Resume Keppra 1000 mg twice daily and Vimpat 200 mg twice daily.  Will transition to IV as patient is altered and unable to take p.o. right now -Patient is still very lethargic which is concerning for  subclinical seizures/status epilepticus. I have asked EEG technician to obtain EEG ASAP.  If EEG does not show any evidence of status, patient can remain at an event hospital.  However, if any concern for status, would recommend transfer to [facility] -Continue seizure precautions -As needed IV Ativan 2 mg for clinical seizure-like activity -Management of UTI per primary team -Discussed plan with Dr. Laural Benes HemoCue  Thank you for allowing [place] to participate in the care of this patient. If you have any further questions, please contact  me or neurohospitalist.   Lindie Spruce Epilepsy [facility]

## 2022-05-24 NOTE — Progress Notes (Signed)
Pt pulled out left hand PIV and is refusing to have it replaced at this time. MD aware.

## 2022-05-24 NOTE — Progress Notes (Signed)
EEG complete - results pending 

## 2022-05-24 NOTE — ED Notes (Signed)
ED TO INPATIENT HANDOFF REPORT  ED Nurse Name and Phone #:   S Name/Age/Gender Lisa [name] 66 y.o. female Room/Bed: APA01/APA01  Code Status   Code Status: Prior  Home/SNF/Other Nursing Home Patient oriented to: self Is this baseline? Yes   Triage Complete: Triage complete  Chief Complaint UTI (urinary tract infection) [N39.0]  Triage Note [facility] for an unwitnessed seizure. Pt is currently postictal, responsive to painful stimuli.    Allergies No Known Allergies  Level of Care/Admitting Diagnosis ED Disposition     ED Disposition  Admit   Condition  --   Comment  Hospital Area: [facility] [[id number]]  Level of Care: Telemetry [5]  Covid Evaluation: Asymptomatic - no recent exposure (last 10 days) testing not required  Diagnosis: UTI (urinary tract infection) [161096][218863]  Admitting Physician: Frankey ShownADEFESO, OLADAPO [[id number]]  Attending Physician: Frankey ShownADEFESO, OLADAPO [[id number]]          B Medical/Surgery History Past Medical History:  Diagnosis Date   Abnormal posture    Adjustment disorder    Anemia, unspecified    Aphasia-angular gyrus syndrome    Arthritis, climacteric, knee, left    Atypical psychosis (HCC)    C. difficile diarrhea    Cerebral arteritis    Contracture of left ankle    Essential hypertension, malignant    Essential hypertension, malignant    Hyperlipemia    Left spastic hemiplegia (HCC)    Muscle weakness (generalized)    Nontraumatic subarachnoid hemorrhage from middle cerebral artery (HCC)    Oropharyngeal dysphagia    Other sequelae following nontraumatic subarachnoid hemorrhage    Primary osteoarthritis of both knees    Screening for thyroid disorder    Screening for thyroid disorder    Seizures (HCC)    Sequelae of cerebral infarction    Unsteady    Vascular dementia with delirium [facility](HCC)    Past Surgical History:  Procedure Laterality Date   CEREBRAL ANEURYSM REPAIR      2017? then suffered stroke     A IV Location/Drains/Wounds Patient Lines/Drains/Airways Status     Active Line/Drains/Airways     Name Placement date Placement time Site Days   Peripheral IV 05/23/22 20 G Left Antecubital 05/23/22  1641  Antecubital  1   Peripheral IV 05/23/22 22 G Left Hand 05/23/22  2327  Hand  1   External Urinary Catheter 05/23/22  1846  --  1            Intake/Output Last 24 hours No intake or output data in the 24 hours ending 05/24/22 0106  Labs/Imaging Results for orders placed or performed during the hospital encounter of 05/23/22 (from the past 48 hour(s))  CBC with Differential/Platelet     Status: Abnormal   Collection Time: 05/23/22  5:45 PM  Result Value Ref Range   WBC 13.2 (H) 4.0 - 10.5 K/uL   RBC 4.53 3.87 - 5.11 MIL/uL   Hemoglobin 12.7 12.0 - 15.0 g/dL   HCT 29.541.4 62.136.0 - 30.846.0 %   MCV 91.4 80.0 - 100.0 fL   MCH 28.0 26.0 - 34.0 pg   MCHC 30.7 30.0 - 36.0 g/dL   RDW 65.715.3 84.611.5 - 96.215.5 %   Platelets 280 150 - 400 K/uL   nRBC 0.0 0.0 - 0.2 %   Neutrophils Relative % 87 %   Neutro Abs 11.5 (H) 1.7 - 7.7 K/uL   Lymphocytes Relative 8 %   Lymphs Abs 1.1  0.7 - 4.0 K/uL   Monocytes Relative 4 %   Monocytes Absolute 0.5 0.1 - 1.0 K/uL   Eosinophils Relative 0 %   Eosinophils Absolute 0.0 0.0 - 0.5 K/uL   Basophils Relative 0 %   Basophils Absolute 0.0 0.0 - 0.1 K/uL   Immature Granulocytes 1 %   Abs Immature Granulocytes 0.09 (H) 0.00 - 0.07 K/uL    Comment: Performed at [facility], [address], [place], [place] [address]  Magnesium     Status: None   Collection Time: 05/23/22  5:45 PM  Result Value Ref Range   Magnesium 2.2 1.7 - 2.4 mg/dL    Comment: Performed at [facility], [address], [place], [place] [address]  Basic metabolic panel     Status: Abnormal   Collection Time: 05/23/22  5:45 PM  Result Value Ref Range   Sodium 140 135 - 145 mmol/L   Potassium 3.7 3.5 - 5.1 mmol/L   Chloride 103 98 - 111 mmol/L   CO2 24 22  - 32 mmol/L   Glucose, Bld 155 (H) 70 - 99 mg/dL    Comment: Glucose reference range applies only to samples taken after fasting for at least 8 hours.   BUN 17 8 - 23 mg/dL   Creatinine, Ser 6.23 0.44 - 1.00 mg/dL   Calcium 9.5 8.9 - 76.2 mg/dL   GFR, Estimated >83 >15 mL/min    Comment: (NOTE) Calculated using the CKD-EPI Creatinine Equation (2021)    Anion gap 13 5 - 15    Comment: Performed at [facility], [address], [place], [place] [address]  Valproic acid level     Status: Abnormal   Collection Time: 05/23/22  5:45 PM  Result Value Ref Range   Valproic Acid Lvl <10 (L) 50.0 - 100.0 ug/mL    Comment: RESULTS CONFIRMED BY MANUAL DILUTION Performed at [facility], [address], [place], [place] [address]   Culture, blood (Routine X 2) w Reflex to ID Panel     Status: None (Preliminary result)   Collection Time: 05/23/22 11:21 PM   Specimen: BLOOD LEFT HAND  Result Value Ref Range   Specimen Description BLOOD LEFT HAND    Special Requests      BOTTLES DRAWN AEROBIC AND ANAEROBIC Blood Culture results may not be optimal due to an inadequate volume of blood received in culture bottles Performed at [facility], [address], [place], [place] [address]    Culture PENDING    Report Status PENDING   Lactic acid, plasma     Status: Abnormal   Collection Time: 05/23/22 11:21 PM  Result Value Ref Range   Lactic Acid, Venous 2.6 (HH) 0.5 - 1.9 mmol/L    Comment: CRITICAL RESULT CALLED TO, READ BACK BY AND VERIFIED WITH: SUPER,B@0020  BY MATTHEWS, B 11.22.2023 Performed at [facility], [address], [place], [place] [address]   Urinalysis, Routine w reflex microscopic Urine, In & Out Cath     Status: Abnormal   Collection Time: 05/23/22 11:26 PM  Result Value Ref Range   Color, Urine YELLOW YELLOW   APPearance CLOUDY (A) CLEAR   Specific Gravity, Urine 1.016 1.005 - 1.030   pH 5.0 5.0 - 8.0   Glucose, UA NEGATIVE NEGATIVE mg/dL   Hgb urine dipstick MODERATE (A) NEGATIVE    Bilirubin Urine NEGATIVE NEGATIVE   Ketones, ur NEGATIVE NEGATIVE mg/dL   Protein, ur 30 (A) NEGATIVE mg/dL   Nitrite POSITIVE (A) NEGATIVE   Leukocytes,Ua  LARGE (A) NEGATIVE   RBC / HPF 11-20 0 - 5 RBC/hpf   WBC, UA >50 (H) 0 - 5 WBC/hpf   Bacteria, UA RARE (A) NONE SEEN   WBC Clumps PRESENT     Comment: Performed at [facility], [address], [place], [place] [address]  Culture, blood (Routine X 2) w Reflex to ID Panel     Status: None (Preliminary result)   Collection Time: 05/23/22 11:26 PM   Specimen: BLOOD RIGHT HAND  Result Value Ref Range   Specimen Description BLOOD RIGHT HAND    Special Requests      BOTTLES DRAWN AEROBIC AND ANAEROBIC Blood Culture results may not be optimal due to an inadequate volume of blood received in culture bottles Performed at [facility], [address] [place], [place] [address]    Culture PENDING    Report Status PENDING    DG Chest Port 1 View  Result Date: 05/23/2022 CLINICAL DATA:  Altered mental status, seizure. EXAM: PORTABLE CHEST 1 VIEW COMPARISON:  05/05/2018. FINDINGS: The heart size and mediastinal contours are within normal limits. There is atherosclerotic calcification of the aorta. Lung volumes are low. No consolidation, effusion, or pneumothorax. No acute osseous abnormality. IMPRESSION: No active disease. Electronically Signed   By: Thornell Sartorius M.D.   On: 05/23/2022 20:01   CT Head Wo Contrast  Result Date: 05/23/2022 CLINICAL DATA:  Mental status change, unknown cause EXAM: CT HEAD WITHOUT CONTRAST TECHNIQUE: Contiguous axial images were obtained from the base of the skull through the vertex without intravenous contrast. RADIATION DOSE REDUCTION: This exam was performed according to the departmental dose-optimization program which includes automated exposure control, adjustment of the mA and/or kV according to patient size and/or use of iterative reconstruction technique. COMPARISON:  CT head 05/06/2018 FINDINGS: Brain:  Cerebral ventricle sizes are concordant with the degree of cerebral volume loss. Similar-appearing right frontoparietal encephalomalacia. Patchy and confluent areas of decreased attenuation are noted throughout the deep and periventricular white matter of the cerebral hemispheres bilaterally, compatible with chronic microvascular ischemic disease. No evidence of large-territorial acute infarction. No parenchymal hemorrhage. No mass lesion. No extra-axial collection. No mass effect or midline shift. No hydrocephalus. Basilar cisterns are patent. Vascular: No hyperdense vessel. 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 Skull: No acute fracture or focal lesion. Sinuses/Orbits: Paranasal sinuses and mastoid air cells are clear. The orbits are unremarkable. Other: None. IMPRESSION: No acute intracranial abnormality. Electronically Signed   By: Tish Frederickson M.D.   On: 05/23/2022 19:34    Pending Labs Unresulted Labs (From admission, onward)     Start     Ordered   05/24/22 0015  Urine Culture  Once,   URGENT       Question:  Indication  Answer:  Altered mental status (if no other cause identified)   05/24/22 0015   05/23/22 2341  Levetiracetam level  Once,   URGENT        05/23/22 2340   05/23/22 2249  Lactic acid, plasma  Now then every 2 hours,   R (with STAT occurrences)      05/23/22 2248            Vitals/Pain Today's Vitals   05/23/22 2230 05/23/22 2328 05/23/22 2330 05/23/22 2345  BP: 114/68  122/84   Pulse:   (!) 102 91  Resp: 17  18 13   Temp:  97.9 F (36.6 C)    TempSrc:  Oral  SpO2:   95% 97%    Isolation Precautions No active isolations  Medications Medications  0.9 %  sodium chloride infusion ( Intravenous Restarted 05/23/22 2328)  cefTRIAXone (ROCEPHIN) 1 g in sodium chloride 0.9 % 100 mL IVPB (1 g Intravenous New Bag/Given 05/24/22 0105)  sodium chloride 0.9 % bolus 500 mL (0 mLs Intravenous Stopped  05/24/22 0106)    Mobility non-ambulatory High fall risk   Focused Assessments    R Recommendations: See Admitting Provider Note  Report given to:   Additional Notes:

## 2022-05-24 NOTE — NC FL2 (Signed)
[place] LEVEL OF CARE SCREENING TOOL     IDENTIFICATION  Patient Name: Lisa [name] Birthdate: [birth year] Sex: female Admission Date (Current Location): 05/23/2022  [place] and [place] Number:  [place] [id number] [place] and Address:  [place] [address], [place]      Provider Number: [id number]  Attending Physician Name and Address:  Murlean Iba, MD  Relative Name and Phone Number:       Current Level of Care: Hospital Recommended Level of Care: Nursing Facility Prior Approval Number:    Date Approved/Denied:   PASRR Number:    Discharge Plan: SNF    Current Diagnoses: Patient Active Problem List   Diagnosis Date Noted   UTI (urinary tract infection) 05/24/2022   Altered mental status 05/24/2022   Leukocytosis 05/24/2022   Hyperglycemia 05/24/2022   Acute cystitis without hematuria    Acute encephalopathy 05/06/2018   Sepsis secondary to UTI ([place]) 05/03/2018   History of completed stroke 05/03/2018   History of ESBL E. coli infection 05/03/2018   Retroperitoneal bleeding 04/11/2018   Retroperitoneal hematoma    Goals of care, counseling/discussion    Palliative care by specialist    DNR (do not resuscitate) discussion    Acute blood loss anemia 04/10/2018   Hypotension 04/10/2018   Seizure disorder ([place]) 04/10/2018   RLQ abdominal pain    Heme positive stool 02/28/2018   E coli bacteremia 09/26/2017   Sepsis due to Escherichia coli (E. coli) ([place]) 09/26/2017   C. 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 See d/c summary for updates.)  AMBULATORY STATUS COMMUNICATION OF NEEDS Skin   Extensive Assist Verbally Normal                       Personal Care Assistance Level of Assistance  Bathing, Feeding, Dressing Bathing Assistance: Maximum assistance Feeding assistance: Limited assistance Dressing Assistance: Maximum assistance     Functional Limitations Info  Sight, Hearing, Speech Sight Info: Adequate Hearing Info: Adequate Speech Info: Adequate    SPECIAL CARE FACTORS FREQUENCY                       Contractures      Additional Factors Info  Code Status, Allergies Code Status Info: Full code Allergies Info: No known allergies           Current Medications (05/24/2022):  This is the current hospital active medication list Current Facility-Administered Medications  Medication Dose Route Frequency Provider Last Rate Last Admin   0.9 %  sodium chloride infusion   Intravenous Continuous Fredia Sorrow, MD 100 mL/hr at 05/23/22 2328 Restarted at 05/23/22 2328   acetaminophen (TYLENOL) tablet 650 mg  650 mg Oral Q6H PRN Adefeso, Oladapo, DO       Or   acetaminophen (TYLENOL) suppository 650 mg  650 mg Rectal Q6H PRN Bernadette Hoit, DO       [  START ON 05/25/2022] cefTRIAXone (ROCEPHIN) 2 g in sodium chloride 0.9 % 100 mL IVPB  2 g Intravenous Q24H Johnson, Clanford L, MD       enoxaparin (LOVENOX) injection 40 mg  40 mg Subcutaneous Q24H Adefeso, Oladapo, DO   40 mg at 05/24/22 0944   lacosamide (VIMPAT) 200 mg in sodium chloride 0.9 % 25 mL IVPB  200 mg Intravenous BID Charlsie Quest, MD 90 mL/hr at 05/24/22 1143 200 mg at 05/24/22 1143   levETIRAcetam (KEPPRA) IVPB 1000 mg/100 mL premix  1,000 mg Intravenous BID Charlsie Quest, MD 400 mL/hr at  05/24/22 1120 1,000 mg at 05/24/22 1120   ondansetron (ZOFRAN) tablet 4 mg  4 mg Oral Q6H PRN Adefeso, Oladapo, DO       Or   ondansetron (ZOFRAN) injection 4 mg  4 mg Intravenous Q6H PRN Frankey Shown, DO         Discharge Medications: Please see discharge summary for a list of discharge medications.  Relevant Imaging Results:  Relevant Lab Results:   Additional Information    Karn Cassis, LCSW

## 2022-05-24 NOTE — TOC Initial Note (Signed)
Transition of Care [facility]) - Initial/Assessment Note    Patient Details  Name: Lisa [name] MRN: [id number] Date of Birth: 11/16/1955  [facility]) CM/SW Contact:    Karn Cassis, LCSW Phone Number: 05/24/2022, 1:19 PM  Clinical Narrative: Pt admitted due to altered mental status/possible seizure and UTI. LCSW completed assessment with pt's sister, Lisa [name] as pt oriented to self only per chart. Lisa [name] states pt has been a resident at [facility] for about 6 years. Lisa [name] is agreeable to return when medically stable. TOC awaiting response from admissions at [facility], but anticipate return to SNF at d/c.                   Expected Discharge Plan: Long Term Nursing Home Barriers to Discharge: Continued Medical Work up   Patient Goals and CMS Choice     Choice offered to / list presented to : Sibling  Expected Discharge Plan and Services Expected Discharge Plan: Long Term Nursing Home In-house Referral: Clinical Social Work   Post Acute Care Choice: Nursing Home Living arrangements for the past 2 months: Skilled Nursing Facility                                      Prior Living Arrangements/Services Living arrangements for the past 2 months: Skilled Nursing Facility Lives with:: Facility Resident Patient language and need for interpreter reviewed:: Yes Do you feel safe going back to the place where you live?: Yes      Need for Family Participation in Patient Care: Yes (Comment) Care giver support system in place?: Yes (comment)   Criminal Activity/Legal Involvement Pertinent to Current Situation/Hospitalization: No - Comment as needed  Activities of Daily Living   ADL Screening (condition at time of admission) Patient's cognitive ability adequate to safely complete daily activities?: No Is the patient deaf or have difficulty hearing?: No Does the patient have difficulty seeing, even when wearing glasses/contacts?: No Does the  patient have difficulty concentrating, remembering, or making decisions?: Yes Patient able to express need for assistance with ADLs?: No Does the patient have difficulty dressing or bathing?: Yes Independently performs ADLs?: No  Permission Sought/Granted   Permission granted to share information with : Yes, Verbal Permission Granted     Permission granted to share info w AGENCY: [facility]  Permission granted to share info w Relationship: SNF     Emotional Assessment   Attitude/Demeanor/Rapport: Unable to Assess Affect (typically observed): Unable to Assess Orientation: : Oriented to Self Alcohol / Substance Use: Not Applicable Psych Involvement: No (comment)  Admission diagnosis:  Seizure (HCC) [R56.9] UTI (urinary tract infection) [N39.0] Acute cystitis without hematuria [N30.00] Altered mental status, unspecified altered mental status type [R41.82] Patient Active Problem List   Diagnosis Date Noted   UTI (urinary tract infection) 05/24/2022   Altered mental status 05/24/2022   Leukocytosis 05/24/2022   Hyperglycemia 05/24/2022   Acute cystitis without hematuria    Acute encephalopathy 05/06/2018   Sepsis secondary to UTI (HCC) 05/03/2018   History of completed stroke 05/03/2018   History of ESBL E. coli infection 05/03/2018   Retroperitoneal bleeding 04/11/2018   Retroperitoneal hematoma    Goals of care, counseling/discussion    Palliative care by specialist    DNR (do not resuscitate) discussion    Acute blood loss anemia 04/10/2018   Hypotension 04/10/2018   Seizure disorder (HCC) 04/10/2018   RLQ abdominal  pain    Heme positive stool 02/28/2018   E coli bacteremia 09/26/2017   Sepsis due to Escherichia coli (E. coli) (HCC) 09/26/2017   C. 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 [place] [address] Phone: [phone] Fax: [phone]     Social Determinants of Health (SDOH) Interventions Food Insecurity Interventions: Other (Comment) (from [facility]) Housing Interventions: Other (Comment) [facility]) Transportation Interventions: Other (Comment) [facility]) Utilities Interventions: Other (Comment) [facility])  Readmission Risk Interventions     No data to display

## 2022-05-24 NOTE — H&P (Signed)
History and Physical    Patient: Lisa [name] [id number]:[id number] DOB: 10/29/1955 DOA: 05/23/2022 DOS: the patient was seen and examined on 05/24/2022 PCP: Audree Bane, DO  Patient coming from: SNF  Chief Complaint:  Chief Complaint  Patient presents with   Seizures   HPI: Lisa [name] is a 66 y.o. female with medical history significant of hypertension, history of CVA, left-sided spastic hemiplegia, aphasia, ESBL E. coli bacteremia who presents emergency department via EMS from [facility] due to a reported unwitnessed seizures.  Patient was felt to be in postictal state and was responsive to painful stimuli.  At bedside, patient denied having seizures, she states that she fell asleep and that she did not know why she was taken to the hospital.  She endorsed increased urinary frequency which started about 2 days ago.  She denies chest pain, shortness of breath, fever, chills, headache.  ED Course:  In the emergency department, she was hemodynamically stable.  Workup in the ED showed normal CBC except for WBC of 13.2, BMP was normal except for glucose of 155, magnesium 2.2, valproic acid < 10, lactic acid 2.6 > 1.7, urinalysis was positive for UTI.  Blood culture pending, CT of head without contrast showed no acute intracranial abnormality Chest x-ray showed no active disease Patient was started on IV ceftriaxone, IV hydration was provided.  Hospitalist was asked to admit patient for further evaluation and management.  Review of Systems: Review of systems as noted in the HPI. All other systems reviewed and are negative.   Past Medical History:  Diagnosis Date   Abnormal posture    Adjustment disorder    Anemia, unspecified    Aphasia-angular gyrus syndrome    Arthritis, climacteric, knee, left    Atypical psychosis (HCC)    C. 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 then suffered stroke    Social History:  reports that she has never smoked. She has never used smokeless tobacco. She reports that she does not drink alcohol and does not use drugs.   No Known Allergies  Family History  Problem Relation Age of Onset   Hypertension Other    Colon cancer Father        diagnosed in his 40s    Colon polyps Sister      Prior to Admission medications   Medication Sig Start Date End Date Taking? Authorizing Provider  aspirin 81 MG chewable tablet Chew 1 tablet (81 mg total) by mouth daily. 05/10/18  Yes Johnson, Clanford L, MD  atorvastatin (LIPITOR) 20 MG tablet Take 20 mg by mouth daily.   Yes [provider]  Cholecalciferol (VITAMIN D3) 50 MCG (2000 UT) TABS Take 1 tablet by mouth daily. Every Thursday   Yes [provider]  lacosamide (VIMPAT) 200 MG TABS tablet Take 200 mg by mouth 2 (two) times daily.   Yes [provider]  levETIRAcetam (KEPPRA) 500 MG tablet Take 1,000 mg by mouth 2 (two) times daily.   Yes [provider]  polyethylene glycol (MIRALAX / GLYCOLAX) packet Take 17 g by mouth 2 (two) times daily. 04/13/18  Yes TatShanon Brow, MD  Propylene Glycol-Glycerin ([employer] ARTIFICIAL TEARS OP) Apply to eye.   Yes [provider]  HYDROcodone-acetaminophen (NORCO/VICODIN) 5-325 MG tablet Take one tab po q 4 hrs prn pain Patient not taking: Reported on  05/23/2022 09/05/19   Kem Parkinson, PA-C    Physical Exam: BP (!) 110/99 (BP Location: Right Arm)   Pulse 95   Temp 97.8 F (36.6 C)   Resp 16   Wt 66.8 kg   SpO2 98%   BMI 25.28 kg/m   General: 66 y.o. year-old female well developed well nourished in no acute distress.  Alert and oriented x3. HEENT: NCAT, EOMI Neck: Supple, trachea medial Cardiovascular: Regular rate and rhythm with no rubs or gallops.  No thyromegaly or JVD noted.  No lower extremity edema. 2/4 pulses in all 4 extremities. Respiratory: Clear to auscultation with no wheezes or rales. Good inspiratory effort. Abdomen: Soft, nontender nondistended with normal bowel sounds x4 quadrants. Muskuloskeletal: No cyanosis, clubbing or edema noted bilaterally Neuro: CN II-XII intact, left-sided weakness, sensation, reflexes intact Skin: No ulcerative lesions noted or rashes Psychiatry:  Mood is appropriate for condition and setting          Labs on Admission:  Basic Metabolic Panel: Recent Labs  Lab 05/23/22 1745  NA 140  K 3.7  CL 103  CO2 24  GLUCOSE 155*  BUN 17  CREATININE 0.86  CALCIUM 9.5  MG 2.2   Liver Function Tests: No results for input(s): "AST", "ALT", "ALKPHOS", "BILITOT", "PROT", "ALBUMIN" in the last 168 hours. No results for input(s): "LIPASE", "AMYLASE" in the last 168 hours. No results for input(s): "AMMONIA" in the last 168 hours. CBC: Recent Labs  Lab 05/23/22 1745  WBC 13.2*  NEUTROABS 11.5*  HGB 12.7  HCT 41.4  MCV 91.4  PLT 280   Cardiac Enzymes: No results for input(s): "CKTOTAL", "CKMB", "CKMBINDEX", "TROPONINI" in the last 168 hours.  BNP (last 3 results) No results for input(s): "BNP" in the last 8760 hours.  ProBNP (last 3 results) No results for input(s): "PROBNP" in the last 8760 hours.  CBG: No results for input(s): "GLUCAP" in the last 168 hours.  Radiological Exams on Admission: DG Chest Port 1 View  Result Date: 05/23/2022 CLINICAL DATA:  Altered mental  status, seizure. EXAM: PORTABLE CHEST 1 VIEW COMPARISON:  05/05/2018. FINDINGS: The heart size and mediastinal contours are within normal limits. There is atherosclerotic calcification of the aorta. Lung volumes are low. No consolidation, effusion, or pneumothorax. No acute osseous abnormality. IMPRESSION: No active disease. Electronically Signed   By: Brett Fairy M.D.   On: 05/23/2022 20:01   CT Head Wo Contrast  Result Date: 05/23/2022 CLINICAL DATA:  Mental status change, unknown cause EXAM: CT HEAD WITHOUT CONTRAST TECHNIQUE: Contiguous axial images were obtained from the base of the skull through the vertex without intravenous contrast. RADIATION DOSE REDUCTION: This exam was performed according to the departmental dose-optimization program which includes automated exposure control, adjustment of the mA and/or kV according to patient size and/or use of iterative reconstruction technique. COMPARISON:  CT head 05/06/2018 FINDINGS: Brain: Cerebral ventricle sizes are concordant with the  degree of cerebral volume loss. Similar-appearing right frontoparietal encephalomalacia. Patchy and confluent areas of decreased attenuation are noted throughout the deep and periventricular white matter of the cerebral hemispheres bilaterally, compatible with chronic microvascular ischemic disease. No evidence of large-territorial acute infarction. No parenchymal hemorrhage. No mass lesion. No extra-axial collection. No mass effect or midline shift. No hydrocephalus. Basilar cisterns are patent. Vascular: No hyperdense vessel. 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 Skull: No acute fracture or focal lesion. Sinuses/Orbits: Paranasal sinuses and mastoid air cells are clear. The orbits are unremarkable. Other: None. IMPRESSION: No acute intracranial abnormality. Electronically Signed   By: Iven Finn M.D.   On: 05/23/2022 19:34    EKG: I independently viewed  the EKG done and my findings are as followed: Sinus tachycardia at a rate of 101 bpm  Assessment/Plan Present on Admission:  UTI (urinary tract infection)  Essential hypertension  Mixed hyperlipidemia  Principal Problem:   Altered mental status Active Problems:   Mixed hyperlipidemia   Essential hypertension   History of completed stroke   UTI (urinary tract infection)   Leukocytosis   Hyperglycemia  Altered mental status possibly secondary to multifactorial  Patient was reported to be mumbling and confused on arrival to the ED.  At bedside, mental status has improved and patient was able to maintain reasonable conversation. It was presumed that she was in a postictal state Continue fall precaution and seizure precaution  UTI POA Urine culture done on 05/03/2018 was positive for Staphylococcus simulans which was sensitive to ciprofloxacin Patient was empirically started on IV ceftriaxone, which she will continue with IV ciprofloxacin Blood culture and urine culture pending  Reported unwitnessed seizures Valproic acid level was < 10; she was no longer on this medication Continue Keppra and Vimpat EEG will be done in the morning Consider teleneurology consult based on findings  Leukocytosis possibly reactive or due to UTI Continue treatment as scribed for UTI Continue to monitor WBC with morning labs  Hyperglycemia possibly reactive CBG 155, patient has no history of T2DM Continue to monitor blood glucose levels with morning labs  Essential hypertension Patient was on no BP medication on med rec, we shall await updated med rec  Mixed hyperlipidemia Continue Lipitor  History of CVA with residual left-sided weakness Continue aspirin and Lipitor  DVT prophylaxis: Lovenox  Code Status: Full code  Family Communication: None at bedside  Consults: None   Severity of Illness: The appropriate patient status for this patient is OBSERVATION. Observation status is judged  to be reasonable and necessary in order to provide the required intensity of service to ensure the patient's safety. The patient's presenting symptoms, physical exam findings, and initial radiographic and laboratory data in the context of their medical condition is felt to place them at decreased risk for further clinical deterioration. Furthermore, it is anticipated that the patient will be medically stable for discharge from the hospital within 2 midnights of admission.   Author: Bernadette Hoit, DO 05/24/2022 5:31 AM  For on call review [email or web address].

## 2022-05-24 NOTE — Progress Notes (Signed)
Date and time results received: 05/24/22 0022   Test: Lactic Acid Critical Value: 2.6  Name of Provider Notified: Zackowski.  Orders Received? Or Actions Taken?: No further orders provided.  Primary RN, Waynetta Sandy was informed. Wardell Heath Gerrianne Scale

## 2022-05-25 DIAGNOSIS — R4182 Altered mental status, unspecified: Secondary | ICD-10-CM | POA: Diagnosis not present

## 2022-05-25 DIAGNOSIS — E876 Hypokalemia: Secondary | ICD-10-CM

## 2022-05-25 DIAGNOSIS — N390 Urinary tract infection, site not specified: Secondary | ICD-10-CM | POA: Diagnosis not present

## 2022-05-25 LAB — COMPREHENSIVE METABOLIC PANEL
ALT: 13 U/L (ref 0–44)
AST: 12 U/L — ABNORMAL LOW (ref 15–41)
Albumin: 2.8 g/dL — ABNORMAL LOW (ref 3.5–5.0)
Alkaline Phosphatase: 83 U/L (ref 38–126)
Anion gap: 4 — ABNORMAL LOW (ref 5–15)
BUN: 11 mg/dL (ref 8–23)
CO2: 25 mmol/L (ref 22–32)
Calcium: 8.1 mg/dL — ABNORMAL LOW (ref 8.9–10.3)
Chloride: 115 mmol/L — ABNORMAL HIGH (ref 98–111)
Creatinine, Ser: 0.64 mg/dL (ref 0.44–1.00)
GFR, Estimated: >60 mL/min (ref >60)
Glucose, Bld: 108 mg/dL — ABNORMAL HIGH (ref 70–99)
Potassium: 3.1 mmol/L — ABNORMAL LOW (ref 3.5–5.1)
Sodium: 144 mmol/L (ref 135–145)
Total Bilirubin: 0.4 mg/dL (ref 0.3–1.2)
Total Protein: 6.5 g/dL (ref 6.5–8.1)

## 2022-05-25 LAB — PHOSPHORUS: Phosphorus: 2.5 mg/dL (ref 2.5–4.6)

## 2022-05-25 LAB — LEVETIRACETAM LEVEL: Levetiracetam Lvl: 25.6 ug/mL (ref 10.0–40.0)

## 2022-05-25 LAB — MAGNESIUM: Magnesium: 1.9 mg/dL (ref 1.7–2.4)

## 2022-05-25 MED ORDER — POTASSIUM CHLORIDE 10 MEQ/100ML IV SOLN
10.0000 meq | INTRAVENOUS | Status: DC
  Filled 2022-05-25: qty 100

## 2022-05-25 MED ORDER — POTASSIUM CHLORIDE CRYS ER 20 MEQ PO TBCR: 40.0000 meq | EXTENDED_RELEASE_TABLET | Freq: Once | ORAL | Status: DC

## 2022-05-25 MED ORDER — CEFDINIR 300 MG PO CAPS: 300.0000 mg | ORAL_CAPSULE | Freq: Two times a day (BID) | ORAL | 0 refills | Status: AC

## 2022-05-25 MED ORDER — MAGNESIUM SULFATE 2 GM/50ML IV SOLN
2.0000 g | Freq: Once | INTRAVENOUS | Status: DC
  Filled 2022-05-25: qty 50

## 2022-05-25 MED ORDER — POTASSIUM CHLORIDE CRYS ER 20 MEQ PO TBCR: 20.0000 meq | EXTENDED_RELEASE_TABLET | Freq: Every day | ORAL | 0 refills | Status: AC

## 2022-05-25 MED ORDER — LEVETIRACETAM 500 MG PO TABS
1000.0000 mg | ORAL_TABLET | Freq: Two times a day (BID) | ORAL | Status: DC
  Administered 2022-05-25: 1000 mg via ORAL
  Filled 2022-05-25: qty 2

## 2022-05-25 MED ORDER — CEFDINIR 300 MG PO CAPS: 300.0000 mg | ORAL_CAPSULE | Freq: Two times a day (BID) | ORAL | Status: DC

## 2022-05-25 MED ORDER — LEVETIRACETAM 1000 MG PO TABS: 1000.0000 mg | ORAL_TABLET | Freq: Two times a day (BID) | ORAL | 1 refills | Status: AC

## 2022-05-25 MED ORDER — LACOSAMIDE 50 MG PO TABS
200.0000 mg | ORAL_TABLET | Freq: Two times a day (BID) | ORAL | Status: DC
  Administered 2022-05-25: 200 mg via ORAL
  Filled 2022-05-25: qty 4

## 2022-05-25 NOTE — Progress Notes (Signed)
Discharged via stretcher by [facility] back to [facility].

## 2022-05-25 NOTE — TOC Transition Note (Signed)
Transition of Care [facility]) - CM/SW Discharge Note   Patient Details  Name: Lisa [name] MRN: [id number] Date of Birth: 1956-01-03  Transition of [facility]) CM/SW Contact:  Leitha Bleak, RN Phone Number: 05/25/2022, 11:05 AM   Clinical Narrative:   Patient discharging back to [facility]. Confirmed room with Revonda Standard. RN to call report, med necessity printed and EMS called. Patient is on the list to transport. Sister updated.     Final next level of care: Long Term Acute Care (LTAC) Barriers to Discharge: Barriers Resolved   Patient Goals and CMS Choice Patient states their goals for this hospitalization and ongoing recovery are:: to return [place]. CMS Medicare.gov Compare Post Acute Care list provided to:: Patient Represenative (must comment) Choice offered to / list presented to : Sibling  Discharge Placement                Patient to be transferred to facility by: EMS Name of family member notified: [name] Patient and family notified of of transfer: 05/25/22  Discharge Plan and Services In-house Referral: Clinical Social Work   Post Acute Care Choice: Nursing Home                               Social Determinants of Health (SDOH) Interventions Food Insecurity Interventions: Other (Comment) (from [facility]) Housing Interventions: Other (Comment) [facility]) Transportation Interventions: Other (Comment) [facility]) Utilities Interventions: Other (Comment) [facility])   Readmission Risk Interventions    05/25/2022   11:05 AM  Readmission Risk Prevention Plan  Post Dischage Appt Complete  Medication Screening Complete  Transportation Screening Complete

## 2022-05-25 NOTE — Discharge Instructions (Signed)
IMPORTANT INFORMATION: PAY CLOSE ATTENTION   PHYSICIAN DISCHARGE INSTRUCTIONS  Follow with Primary care provider  Audree Bane, DO  and other consultants as instructed by your Hospitalist Physician  SEEK MEDICAL CARE OR RETURN TO EMERGENCY ROOM IF SYMPTOMS COME BACK, WORSEN OR NEW PROBLEM DEVELOPS   Please note: You were cared for by a hospitalist during your hospital stay. Every effort will be made to forward records to your primary care provider.  You can request that your primary care provider send for your hospital records if they have not received them.  Once you are discharged, your primary care physician will handle any further medical issues. Please note that NO REFILLS for any discharge medications will be authorized once you are discharged, as it is imperative that you return to your primary care physician (or establish a relationship with a primary care physician if you do not have one) for your post hospital discharge needs so that they can reassess your need for medications and monitor your lab values.  Please get a complete blood count and chemistry panel checked by your Primary MD at your next visit, and again as instructed by your Primary MD.  Get Medicines reviewed and adjusted: Please take all your medications with you for your next visit with your Primary MD  Laboratory/radiological data: Please request your Primary MD to go over all hospital tests and procedure/radiological results at the follow up, please ask your primary care provider to get all Hospital records sent to his/her office.  In some cases, they will be blood work, cultures and biopsy results pending at the time of your discharge. Please request that your primary care provider follow up on these results.  If you are diabetic, please bring your blood sugar readings with you to your follow up appointment with primary care.    Please call and make your follow up appointments as soon as possible.    Also Note the  following: If you experience worsening of your admission symptoms, develop shortness of breath, life threatening emergency, suicidal or homicidal thoughts you must seek medical attention immediately by calling 911 or calling your MD immediately  if symptoms less severe.  You must read complete instructions/literature along with all the possible adverse reactions/side effects for all the Medicines you take and that have been prescribed to you. Take any new Medicines after you have completely understood and accpet all the possible adverse reactions/side effects.   Do not drive when taking Pain medications or sleeping medications (Benzodiazepines)  Do not take more than prescribed Pain, Sleep and Anxiety Medications. It is not advisable to combine anxiety,sleep and pain medications without talking with your primary care practitioner  Special Instructions: If you have smoked or chewed Tobacco  in the last 2 yrs please stop smoking, stop any regular Alcohol  and or any Recreational drug use.  Wear Seat belts while driving.  Do not drive if taking any narcotic, mind altering or controlled substances or recreational drugs or alcohol.

## 2022-05-25 NOTE — Progress Notes (Signed)
Confirmed with social worker that all paperwork had been faxed to nursing home for her to go back.

## 2022-05-25 NOTE — Progress Notes (Signed)
Patient report called to [facility].

## 2022-05-25 NOTE — Discharge Summary (Signed)
Physician Discharge Summary  Lisa Shirelizabeth Knape JXB:[id number] DOB: 1955/12/07 DOA: 05/23/2022  PCP: Audree BaneKing, Peter, DO  Admit date: 05/23/2022 Discharge date: 05/25/2022  Admitted From: Long term care  Disposition: return to long term care   Recommendations for Outpatient Follow-up:  Follow up with PCP in 2 weeks  Discharge Condition: STABLE   CODE STATUS: FULL DIET: Resume prior home diet    Brief Hospitalization Summary: Please see all hospital notes, images, labs for full details of the hospitalization. ADMISSION HPI:  Lisa [name] is a 66 y.o. female with medical history significant of hypertension, history of CVA, left-sided spastic hemiplegia, aphasia, ESBL E. coli bacteremia who presents emergency department via EMS from [facility] due to a reported unwitnessed seizures.  Patient was felt to be in postictal state and was responsive to painful stimuli.  At bedside, patient denied having seizures, she states that she fell asleep and that she did not know why she was taken to the hospital.  She endorsed increased urinary frequency which started about 2 days ago.  She denies chest pain, shortness of breath, fever, chills, headache.   ED Course:  In the emergency department, she was hemodynamically stable.  Workup in the ED showed normal CBC except for WBC of 13.2, BMP was normal except for glucose of 155, magnesium 2.2, valproic acid < 10, lactic acid 2.6 > 1.7, urinalysis was positive for UTI.  Blood culture pending, CT of head without contrast showed no acute intracranial abnormality Chest x-ray showed no active disease.  Patient was started on IV ceftriaxone, IV hydration was provided.  Hospitalist was asked to admit patient for further evaluation and management.    Hospital Course:  Pt was admitted in a postictal state after she had breakthru seizures likely precipitated by a UTI.  She had to be placed on IV antiepileptics and IV antibiotics for the UTI and IV fluids.  She was  seen by teleneurology.  She had an EEG but it did not show findings of status epilepticus.  Neurology recommended that she resume her regular keppra and vimpat doses and treat the UTI.  Pt is awake now and feeling better, eating and drinking and feels back to her baseline and asking to discharge.  She will return to her longterm care facility today in stable condition. She will discharge to complete oral potassium x 3 days and 3 days of oral cefdinir.    Discharge Diagnoses:  Principal Problem:   Altered mental status Active Problems:   Mixed hyperlipidemia   Essential hypertension   History of completed stroke   UTI (urinary tract infection)   Leukocytosis   Hyperglycemia   Discharge Instructions:  Allergies as of 05/25/2022   No Known Allergies      Medication List     STOP taking these medications    HYDROcodone-acetaminophen 5-325 MG tablet Commonly known as: NORCO/VICODIN       TAKE these medications    aspirin 81 MG chewable tablet Chew 1 tablet (81 mg total) by mouth daily.   atorvastatin 20 MG tablet Commonly known as: LIPITOR Take 20 mg by mouth daily.   cefdinir 300 MG capsule Commonly known as: OMNICEF Take 1 capsule (300 mg total) by mouth every 12 (twelve) hours for 3 days. Start taking on: May 26, 2022   [employer] ARTIFICIAL TEARS OP Apply to eye.   lacosamide 200 MG Tabs tablet Commonly known as: VIMPAT Take 200 mg by mouth 2 (two) times daily.   levETIRAcetam 1000 MG tablet Commonly  known as: KEPPRA Take 1 tablet (1,000 mg total) by mouth 2 (two) times daily. What changed: medication strength   polyethylene glycol 17 g packet Commonly known as: MIRALAX / GLYCOLAX Take 17 g by mouth 2 (two) times daily.   potassium chloride SA 20 MEQ tablet Commonly known as: KLOR-CON M Take 1 tablet (20 mEq total) by mouth daily for 3 doses.   Vitamin D3 50 MCG (2000 UT) Tabs Take 1 tablet by mouth daily. Every Thursday        Follow-up  Information     Audree Bane, DO. Schedule an appointment as soon as possible for a visit in 2 week(s).   Specialty: Family Medicine Why: Hospital Follow Up Contact information: [address] [place] [address] [phone]                No Known Allergies Allergies as of 05/25/2022   No Known Allergies      Medication List     STOP taking these medications    HYDROcodone-acetaminophen 5-325 MG tablet Commonly known as: NORCO/VICODIN       TAKE these medications    aspirin 81 MG chewable tablet Chew 1 tablet (81 mg total) by mouth daily.   atorvastatin 20 MG tablet Commonly known as: LIPITOR Take 20 mg by mouth daily.   cefdinir 300 MG capsule Commonly known as: OMNICEF Take 1 capsule (300 mg total) by mouth every 12 (twelve) hours for 3 days. Start taking on: May 26, 2022   [employer] ART[employer] TEARS OP Apply to eye.   lacosamide 200 MG Tabs tablet Commonly known as: VIMPAT Take 200 mg by mouth 2 (two) times daily.   levETIRAcetam 1000 MG tablet Commonly known as: KEPPRA Take 1 tablet (1,000 mg total) by mouth 2 (two) times daily. What changed: medication strength   polyethylene glycol 17 g packet Commonly known as: MIRALAX / GLYCOLAX Take 17 g by mouth 2 (two) times daily.   potassium chloride SA 20 MEQ tablet Commonly known as: KLOR-CON M Take 1 tablet (20 mEq total) by mouth daily for 3 doses.   Vitamin D3 50 MCG (2000 UT) Tabs Take 1 tablet by mouth daily. Every Thursday        Procedures/Studies: EEG adult  Result Date: 06/05/22 Charlsie Quest, MD     06-05-22  2:52 PM Patient Name: Lisa [name] MRN: [id number] Epilepsy Attending: Charlsie Quest Referring Physician/Provider: Frankey Shown, DO Date: 06-05-22 Duration: 30 mins Patient history: 66 year old female with medical comorbidities as noted in HPI who presented with altered mental status and likely breakthrough seizure in the setting of UTI. EEG to  evaluate for seizure Level of alertness: Awake, asleep AEDs during EEG study: LEV, LCM Technical aspects: This EEG study was done with scalp electrodes positioned according to the 10-20 International system of electrode placement. Electrical activity was reviewed with band pass filter of 1-70Hz , sensitivity of 7 uV/mm, display speed of 8mm/sec with a 60Hz  notched filter applied as appropriate. EEG data were recorded continuously and digitally stored.  Video monitoring was available and reviewed as appropriate. Description: No clear posterior dominant rhythm was seen. Sleep was characterized by vertex waves, sleep spindles (12 to 14 Hz), maximal frontocentral region. EEG showed continuous generalized and lateralized right hemisphere 3 to 6 Hz theta-delta slowing. Spikes were noted in right temporal region. Hyperventilation and photic stimulation were not performed.   ABNORMALITY - Spike, right temporal region - Continuous slow, generalized and lateralized right hemisphere IMPRESSION: This study  showed evidence of epileptogenicity arising from right temporal region. There is also cortical dysfunction arising from right hemisphere, likely secondary to underlying stroke. Additionally there is moderate diffuse encephalopathy, nonspecific etiology. No seizures were seen throughout the recording. Charlsie Quest   DG Chest Port 1 View  Result Date: 05/23/2022 CLINICAL DATA:  Altered mental status, seizure. EXAM: PORTABLE CHEST 1 VIEW COMPARISON:  05/05/2018. FINDINGS: The heart size and mediastinal contours are within normal limits. There is atherosclerotic calcification of the aorta. Lung volumes are low. No consolidation, effusion, or pneumothorax. No acute osseous abnormality. IMPRESSION: No active disease. Electronically Signed   By: Thornell Sartorius M.D.   On: 05/23/2022 20:01   CT Head Wo Contrast  Result Date: 05/23/2022 CLINICAL DATA:  Mental status change, unknown cause EXAM: CT HEAD WITHOUT CONTRAST  TECHNIQUE: Contiguous axial images were obtained from the base of the skull through the vertex without intravenous contrast. RADIATION DOSE REDUCTION: This exam was performed according to the departmental dose-optimization program which includes automated exposure control, adjustment of the mA and/or kV according to patient size and/or use of iterative reconstruction technique. COMPARISON:  CT head 05/06/2018 FINDINGS: Brain: Cerebral ventricle sizes are concordant with the degree of cerebral volume loss. Similar-appearing right frontoparietal encephalomalacia. Patchy and confluent areas of decreased attenuation are noted throughout the deep and periventricular white matter of the cerebral hemispheres bilaterally, compatible with chronic microvascular ischemic disease. No evidence of large-territorial acute infarction. No parenchymal hemorrhage. No mass lesion. No extra-axial collection. No mass effect or midline shift. No hydrocephalus. Basilar cisterns are patent. Vascular: No hyperdense vessel. 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 Skull: No acute fracture or focal lesion. Sinuses/Orbits: Paranasal sinuses and mastoid air cells are clear. The orbits are unremarkable. Other: None. IMPRESSION: No acute intracranial abnormality. Electronically Signed   By: Tish Frederickson M.D.   On: 05/23/2022 19:34     Subjective: Pt says she is feeling great, no complaints, no further seizures, no dysuria symptoms.   Pt wants to go home.   Discharge Exam: Vitals:   05/24/22 2121 05/25/22 0427  BP: 131/85 (!) 165/86  Pulse: 91 88  Resp:  18  Temp:  (!) 97.3 F (36.3 C)  SpO2:  100%   Vitals:   05/24/22 1100 05/24/22 2057 05/24/22 2121 05/25/22 0427  BP: (!) 143/81 (!) 179/81 131/85 (!) 165/86  Pulse: 82 91 91 88  Resp: Temp: 98 F (36.7 C) 98 F (36.7 C)  (!) 97.3 F (36.3 C)  TempSrc:      SpO2:  99%  100%  Weight:       General:  Pt is alert, awake, not in acute distress Cardiovascular: RRR, S1/S2 +, no rubs, no gallops Respiratory: CTA bilaterally, no wheezing, no rhonchi Abdominal: Soft, NT, ND, bowel sounds + Extremities: no edema, no cyanosis   The results of significant diagnostics from this hospitalization (including imaging, microbiology, ancillary and laboratory) are listed below for reference.     Microbiology: Recent Results (from the past 240 hour(s))  Culture, blood (Routine X 2) w Reflex to ID Panel     Status: None (Preliminary result)   Collection Time: 05/23/22 11:21 PM   Specimen: BLOOD LEFT HAND  Result Value Ref Range Status   Specimen Description BLOOD LEFT HAND  Final   Special Requests   Final    BOTTLES DRAWN AEROBIC AND ANAEROBIC Blood Culture results may not be optimal due to  an inadequate volume of blood received in culture bottles   Culture   Final    NO GROWTH 2 DAYS Performed at [facility], [address], [place], [place] [address]    Report Status PENDING  Incomplete  Culture, blood (Routine X 2) w Reflex to ID Panel     Status: None (Preliminary result)   Collection Time: 05/23/22 11:26 PM   Specimen: BLOOD RIGHT HAND  Result Value Ref Range Status   Specimen Description BLOOD RIGHT HAND  Final   Special Requests   Final    BOTTLES DRAWN AEROBIC AND ANAEROBIC Blood Culture results may not be optimal due to an inadequate volume of blood received in culture bottles   Culture   Final    NO GROWTH 2 DAYS Performed at [facility], [address], [place], [place] [address]    Report Status PENDING  Incomplete  MRSA Next Gen by PCR, Nasal     Status: None   Collection Time: 05/24/22  2:52 AM   Specimen: Nasal Mucosa; Nasal Swab  Result Value Ref Range Status   MRSA by PCR Next Gen NOT DETECTED NOT DETECTED Final    Comment: (NOTE) The GeneXpert MRSA Assay (FDA approved for NASAL specimens only), is one component of a comprehensive MRSA colonization surveillance program. It  is not intended to diagnose MRSA infection nor to guide or monitor treatment for MRSA infections. Test performance is not FDA approved in patients less than 80 years old. Performed at [facility], [address] [place], [place] [address]      Labs: BNP (last 3 results) No results for input(s): "BNP" in the last 8760 hours. Basic Metabolic Panel: Recent Labs  Lab 05/23/22 1745 05/24/22 1020 05/25/22 0413  NA 140 140 144  K 3.7 3.4* 3.1*  CL 103 109 115*  CO2 24 23 25   GLUCOSE 155* 133* 108*  BUN 17 14 11   CREATININE 0.86 0.76 0.64  CALCIUM 9.5 8.7* 8.1*  MG 2.2  --  1.9  PHOS  --   --  2.5   Liver Function Tests: Recent Labs  Lab 05/25/22 0413  AST 12*  ALT 13  ALKPHOS 83  BILITOT 0.4  PROT 6.5  ALBUMIN 2.8*   No results for input(s): "LIPASE", "AMYLASE" in the last 168 hours. No results for input(s): "AMMONIA" in the last 168 hours. CBC: Recent Labs  Lab 05/23/22 1745 05/24/22 1020  WBC 13.2* 10.0  NEUTROABS 11.5* 7.1  HGB 12.7 11.3*  HCT 41.4 36.8  MCV 91.4 91.3  PLT 280 281   Cardiac Enzymes: No results for input(s): "CKTOTAL", "CKMB", "CKMBINDEX", "TROPONINI" in the last 168 hours. BNP: Invalid input(s): "POCBNP" CBG: No results for input(s): "GLUCAP" in the last 168 hours. D-Dimer No results for input(s): "DDIMER" in the last 72 hours. Hgb A1c No results for input(s): "HGBA1C" in the last 72 hours. Lipid Profile No results for input(s): "CHOL", "HDL", "LDLCALC", "TRIG", "CHOLHDL", "LDLDIRECT" in the last 72 hours. Thyroid function studies No results for input(s): "TSH", "T4TOTAL", "T3FREE", "THYROIDAB" in the last 72 hours.  Invalid input(s): "FREET3" Anemia work up No results for input(s): "VITAMINB12", "FOLATE", "FERRITIN", "TIBC", "IRON", "RETICCTPCT" in the last 72 hours. Urinalysis    Component Value Date/Time   COLORURINE YELLOW 05/23/2022 2326   APPEARANCEUR CLOUDY (A) 05/23/2022 2326   LABSPEC 1.016 05/23/2022 2326   PHURINE  5.0 05/23/2022 2326   GLUCOSEU NEGATIVE 05/23/2022 2326   HGBUR MODERATE (A) 05/23/2022 2326   BILIRUBINUR NEGATIVE  05/23/2022 2326   KETONESUR NEGATIVE 05/23/2022 2326   PROTEINUR 30 (A) 05/23/2022 2326   NITRITE POSITIVE (A) 05/23/2022 2326   LEUKOCYTESUR LARGE (A) 05/23/2022 2326   Sepsis Labs Recent Labs  Lab 05/23/22 1745 05/24/22 1020  WBC 13.2* 10.0   Microbiology Recent Results (from the past 240 hour(s))  Culture, blood (Routine X 2) w Reflex to ID Panel     Status: None (Preliminary result)   Collection Time: 05/23/22 11:21 PM   Specimen: BLOOD LEFT HAND  Result Value Ref Range Status   Specimen Description BLOOD LEFT HAND  Final   Special Requests   Final    BOTTLES DRAWN AEROBIC AND ANAEROBIC Blood Culture results may not be optimal due to an inadequate volume of blood received in culture bottles   Culture   Final    NO GROWTH 2 DAYS Performed at [facility], [address], [place], [place] [address]    Report Status PENDING  Incomplete  Culture, blood (Routine X 2) w Reflex to ID Panel     Status: None (Preliminary result)   Collection Time: 05/23/22 11:26 PM   Specimen: BLOOD RIGHT HAND  Result Value Ref Range Status   Specimen Description BLOOD RIGHT HAND  Final   Special Requests   Final    BOTTLES DRAWN AEROBIC AND ANAEROBIC Blood Culture results may not be optimal due to an inadequate volume of blood received in culture bottles   Culture   Final    NO GROWTH 2 DAYS Performed at [facility], [address] [place], [place] [address]    Report Status PENDING  Incomplete  MRSA Next Gen by PCR, Nasal     Status: None   Collection Time: 05/24/22  2:52 AM   Specimen: Nasal Mucosa; Nasal Swab  Result Value Ref Range Status   MRSA by PCR Next Gen NOT DETECTED NOT DETECTED Final    Comment: (NOTE) The GeneXpert MRSA Assay (FDA approved for NASAL specimens only), is one component of a comprehensive MRSA colonization surveillance program. It is not intended  to diagnose MRSA infection nor to guide or monitor treatment for MRSA infections. Test performance is not FDA approved in patients less than 49 years old. Performed at [facility], [address] [place] [address] [place], [place] [address]    Time coordinating discharge: 31 mins   SIGNED:  Standley Dakins, MD  [facility] 05/25/2022, 10:41 AM How to contact the [facility] Attending or Consulting provider 7A - 7P or covering provider during after hours 7P -7A, for this patient?  Check the care team in [facility] and look for a) attending/consulting [facility] provider listed and b) the [facility] team listed Log into [email or web address] and use [place]'s universal password to access. If you do not have the password, please contact the hospital operator. Locate the [facility] provider you are looking for under [facility] and page to a number that you can be directly reached. If you still have difficulty reaching the provider, please page the [facility] (Director on Call) for the Hospitalists listed on amion for assistance.

## 2022-05-26 LAB — URINE CULTURE: Culture: 50000 — AB

## 2022-05-28 LAB — CULTURE, BLOOD (ROUTINE X 2)
Culture: NO GROWTH
Culture: NO GROWTH
